# Patient Record
Sex: Female | Born: 1965 | Race: Black or African American | Hispanic: No | Marital: Married | Smoking: Current every day smoker
Health system: Southern US, Community
[De-identification: ages and names within clinical notes are randomized; demographics above are authoritative.]

## PROBLEM LIST (undated history)

## (undated) ENCOUNTER — Emergency Department (HOSPITAL_COMMUNITY): Admission: EM | Payer: MEDICAID | Source: Home / Self Care

## (undated) DIAGNOSIS — G459 Transient cerebral ischemic attack, unspecified: Secondary | ICD-10-CM

## (undated) DIAGNOSIS — T7840XA Allergy, unspecified, initial encounter: Secondary | ICD-10-CM

## (undated) DIAGNOSIS — R51 Headache: Secondary | ICD-10-CM

## (undated) DIAGNOSIS — R001 Bradycardia, unspecified: Secondary | ICD-10-CM

## (undated) DIAGNOSIS — R0682 Tachypnea, not elsewhere classified: Secondary | ICD-10-CM

## (undated) DIAGNOSIS — Z720 Tobacco use: Secondary | ICD-10-CM

## (undated) DIAGNOSIS — I639 Cerebral infarction, unspecified: Secondary | ICD-10-CM

## (undated) DIAGNOSIS — R519 Headache, unspecified: Secondary | ICD-10-CM

## (undated) DIAGNOSIS — E119 Type 2 diabetes mellitus without complications: Secondary | ICD-10-CM

## (undated) HISTORY — DX: Cerebral infarction, unspecified: I63.9

## (undated) HISTORY — DX: Tachypnea, not elsewhere classified: R06.82

## (undated) HISTORY — DX: Type 2 diabetes mellitus without complications: E11.9

## (undated) HISTORY — DX: Allergy, unspecified, initial encounter: T78.40XA

## (undated) HISTORY — PX: BRAIN SURGERY: SHX531

## (undated) HISTORY — DX: Transient cerebral ischemic attack, unspecified: G45.9

## (undated) HISTORY — DX: Bradycardia, unspecified: R00.1

---

## 1985-12-18 HISTORY — PX: TUBAL LIGATION: SHX77

## 1999-05-10 ENCOUNTER — Emergency Department (HOSPITAL_COMMUNITY): Admission: EM | Admit: 1999-05-10 | Discharge: 1999-05-10 | Payer: Self-pay | Admitting: Emergency Medicine

## 2000-03-13 ENCOUNTER — Emergency Department (HOSPITAL_COMMUNITY): Admission: EM | Admit: 2000-03-13 | Discharge: 2000-03-13 | Payer: Self-pay | Admitting: *Deleted

## 2001-07-09 ENCOUNTER — Emergency Department (HOSPITAL_COMMUNITY): Admission: EM | Admit: 2001-07-09 | Discharge: 2001-07-09 | Payer: Self-pay | Admitting: Emergency Medicine

## 2004-02-06 ENCOUNTER — Emergency Department (HOSPITAL_COMMUNITY): Admission: EM | Admit: 2004-02-06 | Discharge: 2004-02-06 | Payer: Self-pay | Admitting: Emergency Medicine

## 2004-05-13 ENCOUNTER — Emergency Department (HOSPITAL_COMMUNITY): Admission: EM | Admit: 2004-05-13 | Discharge: 2004-05-13 | Payer: Self-pay | Admitting: Emergency Medicine

## 2005-07-29 ENCOUNTER — Emergency Department (HOSPITAL_COMMUNITY): Admission: AD | Admit: 2005-07-29 | Discharge: 2005-07-29 | Payer: Self-pay | Admitting: Family Medicine

## 2007-01-31 ENCOUNTER — Ambulatory Visit (HOSPITAL_COMMUNITY): Admission: RE | Admit: 2007-01-31 | Discharge: 2007-01-31 | Payer: Self-pay | Admitting: Family Medicine

## 2007-08-15 ENCOUNTER — Emergency Department (HOSPITAL_COMMUNITY): Admission: EM | Admit: 2007-08-15 | Discharge: 2007-08-15 | Payer: Self-pay | Admitting: Family Medicine

## 2007-09-27 ENCOUNTER — Emergency Department (HOSPITAL_COMMUNITY): Admission: EM | Admit: 2007-09-27 | Discharge: 2007-09-27 | Payer: Self-pay | Admitting: Emergency Medicine

## 2008-05-27 ENCOUNTER — Emergency Department (HOSPITAL_COMMUNITY): Admission: EM | Admit: 2008-05-27 | Discharge: 2008-05-27 | Payer: Self-pay | Admitting: Family Medicine

## 2008-09-16 ENCOUNTER — Emergency Department (HOSPITAL_COMMUNITY): Admission: EM | Admit: 2008-09-16 | Discharge: 2008-09-16 | Payer: Self-pay | Admitting: Family Medicine

## 2009-04-20 ENCOUNTER — Emergency Department (HOSPITAL_COMMUNITY): Admission: EM | Admit: 2009-04-20 | Discharge: 2009-04-20 | Payer: Self-pay | Admitting: Family Medicine

## 2009-10-18 ENCOUNTER — Emergency Department (HOSPITAL_COMMUNITY): Admission: EM | Admit: 2009-10-18 | Discharge: 2009-10-18 | Payer: Self-pay | Admitting: Emergency Medicine

## 2010-03-04 ENCOUNTER — Emergency Department (HOSPITAL_COMMUNITY): Admission: EM | Admit: 2010-03-04 | Discharge: 2010-03-04 | Payer: Self-pay | Admitting: Emergency Medicine

## 2010-06-26 ENCOUNTER — Emergency Department (HOSPITAL_COMMUNITY): Admission: EM | Admit: 2010-06-26 | Discharge: 2010-06-26 | Payer: Self-pay | Admitting: Family Medicine

## 2010-07-01 ENCOUNTER — Emergency Department (HOSPITAL_COMMUNITY): Admission: EM | Admit: 2010-07-01 | Discharge: 2010-07-01 | Payer: Self-pay | Admitting: Emergency Medicine

## 2010-07-05 ENCOUNTER — Ambulatory Visit (HOSPITAL_COMMUNITY): Admission: RE | Admit: 2010-07-05 | Discharge: 2010-07-05 | Payer: Self-pay | Admitting: Internal Medicine

## 2010-07-20 ENCOUNTER — Other Ambulatory Visit: Admission: RE | Admit: 2010-07-20 | Discharge: 2010-07-20 | Payer: Self-pay | Admitting: Interventional Radiology

## 2010-07-20 ENCOUNTER — Encounter: Admission: RE | Admit: 2010-07-20 | Discharge: 2010-07-20 | Payer: Self-pay | Admitting: Internal Medicine

## 2011-03-05 LAB — DIFFERENTIAL
Basophils Absolute: 0.1 10*3/uL (ref 0.0–0.1)
Basophils Relative: 1 % (ref 0–1)
Eosinophils Relative: 1 % (ref 0–5)
Lymphocytes Relative: 45 % (ref 12–46)
Lymphs Abs: 3.6 10*3/uL (ref 0.7–4.0)
Monocytes Relative: 4 % (ref 3–12)

## 2011-03-05 LAB — CBC
Hemoglobin: 15.4 g/dL — ABNORMAL HIGH (ref 12.0–15.0)
MCH: 33 pg (ref 26.0–34.0)
MCV: 95.8 fL (ref 78.0–100.0)
RBC: 4.66 MIL/uL (ref 3.87–5.11)

## 2011-09-14 LAB — POCT RAPID STREP A: Streptococcus, Group A Screen (Direct): NEGATIVE

## 2012-02-17 ENCOUNTER — Emergency Department (HOSPITAL_COMMUNITY)
Admission: EM | Admit: 2012-02-17 | Discharge: 2012-02-17 | Disposition: A | Discharge status: Home Health | Payer: No Typology Code available for payment source | Attending: Emergency Medicine | Admitting: Emergency Medicine

## 2012-02-17 ENCOUNTER — Emergency Department (HOSPITAL_COMMUNITY): Payer: No Typology Code available for payment source

## 2012-02-17 ENCOUNTER — Encounter (HOSPITAL_COMMUNITY): Payer: Self-pay | Admitting: *Deleted

## 2012-02-17 DIAGNOSIS — M545 Low back pain, unspecified: Secondary | ICD-10-CM | POA: Insufficient documentation

## 2012-02-17 DIAGNOSIS — S139XXA Sprain of joints and ligaments of unspecified parts of neck, initial encounter: Secondary | ICD-10-CM | POA: Insufficient documentation

## 2012-02-17 DIAGNOSIS — M542 Cervicalgia: Secondary | ICD-10-CM | POA: Insufficient documentation

## 2012-02-17 DIAGNOSIS — S161XXA Strain of muscle, fascia and tendon at neck level, initial encounter: Secondary | ICD-10-CM

## 2012-02-17 MED ORDER — HYDROCODONE-ACETAMINOPHEN 5-325 MG PO TABS: 2.0000 | ORAL_TABLET | ORAL | Status: DC | PRN

## 2012-02-17 MED ORDER — IBUPROFEN 800 MG PO TABS: 800.0000 mg | ORAL_TABLET | Freq: Three times a day (TID) | ORAL | Status: DC

## 2012-02-17 MED ORDER — HYDROCODONE-ACETAMINOPHEN 5-325 MG PO TABS
1.0000 | ORAL_TABLET | Freq: Once | ORAL | Status: AC
  Administered 2012-02-17: 1 via ORAL
  Filled 2012-02-17: qty 1

## 2012-02-17 NOTE — ED Notes (Signed)
Patient transported to X-ray 
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
## 2012-02-21 ENCOUNTER — Emergency Department (HOSPITAL_COMMUNITY)
Admission: EM | Admit: 2012-02-21 | Discharge: 2012-02-21 | Disposition: A | Discharge status: Home / Self Care | Payer: No Typology Code available for payment source | Attending: Emergency Medicine | Admitting: Emergency Medicine

## 2012-02-21 ENCOUNTER — Encounter (HOSPITAL_COMMUNITY): Payer: Self-pay | Admitting: *Deleted

## 2012-02-21 DIAGNOSIS — S139XXA Sprain of joints and ligaments of unspecified parts of neck, initial encounter: Secondary | ICD-10-CM | POA: Insufficient documentation

## 2012-02-21 DIAGNOSIS — G44209 Tension-type headache, unspecified, not intractable: Secondary | ICD-10-CM | POA: Insufficient documentation

## 2012-02-21 DIAGNOSIS — J45909 Unspecified asthma, uncomplicated: Secondary | ICD-10-CM | POA: Insufficient documentation

## 2012-02-21 DIAGNOSIS — Y92410 Unspecified street and highway as the place of occurrence of the external cause: Secondary | ICD-10-CM | POA: Insufficient documentation

## 2012-02-21 DIAGNOSIS — F17200 Nicotine dependence, unspecified, uncomplicated: Secondary | ICD-10-CM | POA: Insufficient documentation

## 2012-02-21 DIAGNOSIS — S161XXA Strain of muscle, fascia and tendon at neck level, initial encounter: Secondary | ICD-10-CM

## 2012-02-21 MED ORDER — DIAZEPAM 5 MG PO TABS: 5.0000 mg | ORAL_TABLET | Freq: Two times a day (BID) | ORAL | Status: AC

## 2012-02-21 MED ORDER — NAPROXEN 500 MG PO TABS: 500.0000 mg | ORAL_TABLET | Freq: Two times a day (BID) | ORAL | Status: DC

## 2012-02-21 NOTE — Discharge Instructions (Signed)
I suspect your headache is related to muscle spasm in your neck. Please take the naproxen as prescribed for the next 3-5 days, twice daily. Use the muscle relaxer as needed. Use ice over the area, wrapped in a towel, for 20 minutes at a time several times daily. Return to the ED if you have worsening pain or condition or any other concerns.  Cervical Strain Care After A cervical strain is when the muscles and ligaments in your neck have been stretched. The bones are not broken. If you had any problems moving your arms or legs immediately after the injury, even if the problem has gone away, make sure to tell this to your caregiver.  HOME CARE INSTRUCTIONS   While awake, apply ice packs to the neck or areas of pain about every 1 to 2 hours, for 15 to 20 minutes at a time. Do this for 2 days. If you were given a cervical collar for support, ask your caregiver if you may remove it for bathing or applying ice.   If given a cervical collar, wear as instructed. Do not remove any collar unless instructed by a caregiver.   Only take over-the-counter or prescription medicines for pain, discomfort, or fever as directed by your caregiver.  Recheck with the hospital or clinic after a radiologist has read your X-rays. Recheck with the hospital or clinic to make sure the initial readings are correct. Do this also to determine if you need further studies. It is your responsibility to find out your X-ray results. X-rays are sometimes repeated in one week to ten days. These are often repeated to make sure that a hairline fracture was not overlooked. Ask your caregiver how you are to find out about your radiology (X-ray) results. SEEK IMMEDIATE MEDICAL CARE IF:   You have increasing pain in your neck.   You develop difficulties swallowing or breathing.   You have numbness, weakness, or movement problems in the arms or legs.   You have difficulty walking.   You develop bowel or bladder retention or incontinence.     You have problems with walking.  MAKE SURE YOU:   Understand these instructions.   Will watch your condition.   Will get help right away if you are not doing well or get worse.  Document Released: 12/04/2005 Document Revised: 08/16/2011 Document Reviewed: 07/17/2008 [facility] Patient Information 2012 [place], [place].Tension Headache (Muscle Contraction Headache) Tension headache is one of the most common causes of head pain. These headaches are usually felt as a pain over the top of your head and back of your neck. Stress, anxiety, and depression are common triggers for these headaches. Tension headaches are not life-threatening and will not lead to other types of headaches. Tension headaches can often be diagnosed by taking a history from the patient and a physical exam. Sometimes, further lab and x-ray studies are used to confirm the diagnosis. Your caregiver can advise you on how to get help solving problems that cause anxiety or stress. Antidepressants can be prescribed if depression is a problem. HOME CARE INSTRUCTIONS   If testing was done, call for your results. Remember, it is your responsibility to get the results of all testing. Do not assume everything is fine because you do not hear from your caregiver.   Only take over-the-counter or prescription medicines for pain, discomfort, or fever as directed by your caregiver.   Biofeedback, massage, or other relaxation techniques may be helpful.   Ice packs or heat to the head  and neck can be used. Use these three to four times per day or as needed.   Physical therapy may be a useful addition to treatment.   If headaches continue, even with therapy, you may need to think about lifestyle changes.   Avoid excessive use of pain killers, as rebound headaches can occur.  SEEK MEDICAL CARE IF:   You develop problems with medications prescribed.   You do not respond or get no relief from medications.   You have a change from the usual  headache.   You develop nausea (feeling sick to your stomach) or vomiting.  SEEK IMMEDIATE MEDICAL CARE IF:   Your headache becomes severe.   You have an unexplained oral temperature above 102 F (38.9 C).   You develop a stiff neck.   You have loss of vision.   You have muscular weakness.   You have loss of muscular control.   You develop severe symptoms different from your first symptoms.   You start losing your balance or have trouble walking.   You feel faint or pass out.  MAKE SURE YOU:   Understand these instructions.   Will watch your condition.   Will get help right away if you are not doing well or get worse.  Document Released: 12/04/2005 Document Revised: 11/23/2011 Document Reviewed: 07/23/2008 [facility] Patient Information 2012 [place], [place].
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
## 2012-02-24 NOTE — ED Provider Notes (Signed)
Medical screening examination/treatment/procedure(s) were performed by non-physician practitioner and as supervising physician I was immediately available for consultation/collaboration.  Cyndra Numbers, MD 02/24/12 [phone]

## 2012-04-04 ENCOUNTER — Emergency Department (HOSPITAL_COMMUNITY)
Admission: EM | Admit: 2012-04-04 | Discharge: 2012-04-04 | Disposition: A | Discharge status: Home / Self Care | Payer: No Typology Code available for payment source | Attending: Emergency Medicine | Admitting: Emergency Medicine

## 2012-04-04 ENCOUNTER — Encounter (HOSPITAL_COMMUNITY): Payer: Self-pay | Admitting: General Practice

## 2012-04-04 DIAGNOSIS — M542 Cervicalgia: Secondary | ICD-10-CM | POA: Insufficient documentation

## 2012-04-04 DIAGNOSIS — F17200 Nicotine dependence, unspecified, uncomplicated: Secondary | ICD-10-CM | POA: Insufficient documentation

## 2012-04-04 DIAGNOSIS — T148XXA Other injury of unspecified body region, initial encounter: Secondary | ICD-10-CM | POA: Insufficient documentation

## 2012-04-04 DIAGNOSIS — M545 Low back pain, unspecified: Secondary | ICD-10-CM | POA: Insufficient documentation

## 2012-04-04 DIAGNOSIS — J45909 Unspecified asthma, uncomplicated: Secondary | ICD-10-CM | POA: Insufficient documentation

## 2012-04-04 DIAGNOSIS — M79609 Pain in unspecified limb: Secondary | ICD-10-CM | POA: Insufficient documentation

## 2012-04-04 MED ORDER — IBUPROFEN 800 MG PO TABS: 800.0000 mg | ORAL_TABLET | Freq: Three times a day (TID) | ORAL | Status: AC

## 2012-04-04 MED ORDER — CYCLOBENZAPRINE HCL 10 MG PO TABS
10.0000 mg | ORAL_TABLET | Freq: Once | ORAL | Status: AC
  Administered 2012-04-04: 10 mg via ORAL
  Filled 2012-04-04: qty 1

## 2012-04-04 MED ORDER — IBUPROFEN 800 MG PO TABS
800.0000 mg | ORAL_TABLET | Freq: Once | ORAL | Status: AC
  Administered 2012-04-04: 800 mg via ORAL
  Filled 2012-04-04: qty 1

## 2012-04-04 MED ORDER — CYCLOBENZAPRINE HCL 10 MG PO TABS: 10.0000 mg | ORAL_TABLET | Freq: Three times a day (TID) | ORAL | Status: AC | PRN

## 2012-04-04 NOTE — ED Provider Notes (Signed)
Medical screening examination/treatment/procedure(s) were performed by non-physician practitioner and as supervising physician I was immediately available for consultation/collaboration.  Cheri Guppy, MD 04/04/12 2005
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
## 2012-10-09 ENCOUNTER — Encounter (HOSPITAL_COMMUNITY): Payer: Self-pay | Admitting: *Deleted

## 2012-10-09 ENCOUNTER — Emergency Department (HOSPITAL_COMMUNITY)
Admission: EM | Admit: 2012-10-09 | Discharge: 2012-10-09 | Disposition: A | Discharge status: Home / Self Care | Payer: Self-pay | Attending: Emergency Medicine | Admitting: Emergency Medicine

## 2012-10-09 DIAGNOSIS — K047 Periapical abscess without sinus: Secondary | ICD-10-CM

## 2012-10-09 DIAGNOSIS — F17200 Nicotine dependence, unspecified, uncomplicated: Secondary | ICD-10-CM | POA: Insufficient documentation

## 2012-10-09 DIAGNOSIS — K044 Acute apical periodontitis of pulpal origin: Secondary | ICD-10-CM | POA: Insufficient documentation

## 2012-10-09 DIAGNOSIS — J45909 Unspecified asthma, uncomplicated: Secondary | ICD-10-CM | POA: Insufficient documentation

## 2012-10-09 MED ORDER — DIAZEPAM 5 MG PO TABS
5.0000 mg | ORAL_TABLET | Freq: Once | ORAL | Status: AC
  Administered 2012-10-09: 5 mg via ORAL
  Filled 2012-10-09: qty 1

## 2012-10-09 MED ORDER — DIAZEPAM 5 MG PO TABS: 5.0000 mg | ORAL_TABLET | Freq: Two times a day (BID) | ORAL | Status: DC

## 2012-10-09 MED ORDER — KETOROLAC TROMETHAMINE 60 MG/2ML IM SOLN
60.0000 mg | Freq: Once | INTRAMUSCULAR | Status: AC
  Administered 2012-10-09: 60 mg via INTRAMUSCULAR
  Filled 2012-10-09: qty 2

## 2012-10-09 MED ORDER — PENICILLIN V POTASSIUM 500 MG PO TABS: 500.0000 mg | ORAL_TABLET | Freq: Three times a day (TID) | ORAL | Status: DC

## 2012-10-09 NOTE — ED Provider Notes (Signed)
History     CSN: [id number]  Arrival date & time 10/09/12  1956   First MD Initiated Contact with Patient 10/09/12 2103      Chief Complaint  Patient presents with  . Dental Pain    (Consider location/radiation/quality/duration/timing/severity/associated sxs/prior treatment) HPI Comments: Patient is a 46 year old female who presents with dental pain for the past 3 days. The pain started gradually and progressively worsened. The pain is throbbing and located at her left lower jaw. She reports associated swelling that started this morning. Eating makes the pain worse. Nothing makes the pain better. She has not tried anything for symptoms relief. Patient denies fever, visual changes, headache, NVD, throat swelling, chest pain, SOB, abdominal pain.   Patient is a 46 y.o. female presenting with tooth pain.  Dental Pain Additional symptoms include: facial swelling.    Past Medical History  Diagnosis Date  . Asthma     Past Surgical History  Procedure Date  . Tubal ligation     History reviewed. No pertinent family history.  History  Substance Use Topics  . Smoking status: Current Every Day Smoker    Types: Cigarettes  . Smokeless tobacco: Not on file  . Alcohol Use: Yes     occ    OB History    Grav Para Term Preterm Abortions TAB SAB Ect Mult Living                  Review of Systems  HENT: Positive for facial swelling and dental problem.   All other systems reviewed and are negative.    Allergies  Hydrocodone  Home Medications   Current Outpatient Rx  Name Route Sig Dispense Refill  . IBUPROFEN 200 MG PO TABS Oral Take 400 mg by mouth every 8 (eight) hours as needed. For pain      BP 118/61  Temp 98.4 F (36.9 C) (Oral)  Resp 18  Ht 5\' 3"  (1.6 m)  Wt 250 lb (113.399 kg)  BMI 44.29 kg/m2  SpO2 100%  LMP 09/18/2012  Physical Exam  Nursing note and vitals reviewed. Constitutional: She is oriented to person, place, and time. She appears  well-developed and well-nourished. No distress.  HENT:  Head: Normocephalic and atraumatic.  Mouth/Throat: Oropharynx is clear and moist. No oropharyngeal exudate.       Notable left lower jaw swelling and tenderness to palpation. Poor dentition. Left lower molar cracked and extremely tender to percussion. No buccal mucosa ulcerations noted.   Eyes: Conjunctivae normal and EOM are normal. Pupils are equal, round, and reactive to light. No scleral icterus.  Neck: Normal range of motion. Neck supple.  Cardiovascular: Normal rate and regular rhythm.  Exam reveals no gallop and no friction rub.   No murmur heard. Pulmonary/Chest: Effort normal and breath sounds normal. She has no wheezes. She has no rales. She exhibits no tenderness.  Abdominal: Soft. She exhibits no distension.  Musculoskeletal: Normal range of motion.  Neurological: She is alert and oriented to person, place, and time.       Speech is goal-oriented. Moves limbs without ataxia.   Skin: Skin is warm and dry. She is not diaphoretic.  Psychiatric: She has a normal mood and affect. Her behavior is normal.    ED Course  Procedures (including critical care time)  Labs Reviewed - No data to display No results found.   1. Dental infection       MDM  9:48 PM Patient will have toradol and valium  for pain. Swelling likely due to lymphadenopathy rather than parotid stone or infection. Patient will go home with Pen VK, Vicodin, and dental follow up. Patient currently afebrile without signs of airway compromise. No further evaluation needed at this time.         Emilia Beck, PA-C 10/09/12 2253

## 2012-10-09 NOTE — ED Provider Notes (Signed)
Medical screening examination/treatment/procedure(s) were performed by non-physician practitioner and as supervising physician I was immediately available for consultation/collaboration.   Kherington Meraz H Bernardina Cacho, MD 10/09/12 2330 

## 2012-10-09 NOTE — ED Notes (Signed)
Pt c/o toothache pain. Pt has not seen dentist in many years. Pt with pain on left lower back side of mouth.

## 2014-02-02 ENCOUNTER — Emergency Department (HOSPITAL_COMMUNITY): Payer: Self-pay

## 2014-02-02 ENCOUNTER — Emergency Department (HOSPITAL_COMMUNITY)
Admission: EM | Admit: 2014-02-02 | Discharge: 2014-02-02 | Disposition: A | Discharge status: Home / Self Care | Payer: Self-pay | Attending: Emergency Medicine | Admitting: Emergency Medicine

## 2014-02-02 ENCOUNTER — Encounter (HOSPITAL_COMMUNITY): Payer: Self-pay | Admitting: Emergency Medicine

## 2014-02-02 DIAGNOSIS — R220 Localized swelling, mass and lump, head: Secondary | ICD-10-CM | POA: Insufficient documentation

## 2014-02-02 DIAGNOSIS — J45909 Unspecified asthma, uncomplicated: Secondary | ICD-10-CM | POA: Insufficient documentation

## 2014-02-02 DIAGNOSIS — R61 Generalized hyperhidrosis: Secondary | ICD-10-CM | POA: Insufficient documentation

## 2014-02-02 DIAGNOSIS — R221 Localized swelling, mass and lump, neck: Secondary | ICD-10-CM

## 2014-02-02 DIAGNOSIS — J069 Acute upper respiratory infection, unspecified: Secondary | ICD-10-CM | POA: Insufficient documentation

## 2014-02-02 DIAGNOSIS — J111 Influenza due to unidentified influenza virus with other respiratory manifestations: Secondary | ICD-10-CM | POA: Insufficient documentation

## 2014-02-02 DIAGNOSIS — F17200 Nicotine dependence, unspecified, uncomplicated: Secondary | ICD-10-CM | POA: Insufficient documentation

## 2014-02-02 MED ORDER — BENZONATATE 100 MG PO CAPS: 100.0000 mg | ORAL_CAPSULE | Freq: Three times a day (TID) | ORAL | Status: DC

## 2014-02-02 MED ORDER — PROMETHAZINE-DM 6.25-15 MG/5ML PO SYRP: 5.0000 mL | ORAL_SOLUTION | Freq: Four times a day (QID) | ORAL | Status: DC | PRN

## 2014-02-02 MED ORDER — GUAIFENESIN ER 1200 MG PO TB12: 1.0000 | ORAL_TABLET | Freq: Two times a day (BID) | ORAL | Status: DC

## 2014-02-02 NOTE — Progress Notes (Signed)
[facility] provided pt with a list of primary care resources and [facility] orange card application to help patient establish primary care.  °

## 2014-02-02 NOTE — ED Provider Notes (Signed)
CSN: [id number]     Arrival date & time 02/02/14  4098 History   First MD Initiated Contact with Patient 02/02/14 0750     Chief Complaint  Patient presents with  . flu like symptoms     x2 weeks intermittently     (Consider location/radiation/quality/duration/timing/severity/associated sxs/prior Treatment) HPI 48 yo black female with PMH of asthma who presents with flu like symptoms for 4 days. Patient complains of rhinorrhea, headache, diarrhea, sore throat, cough, increased sputum production, and vomiting yesterday. She has been feeling poorly off and on for approximately 2 weeks. This episode started 4 days ago. She lives with her grandchildren who have been sick as well. Patient denies hematemesis, hemoptysis, dysphagia, chest pain, and SOB.   Past Medical History  Diagnosis Date  . Asthma    Past Surgical History  Procedure Laterality Date  . Tubal ligation     No family history on file. History  Substance Use Topics  . Smoking status: Current Every Day Smoker -- 1.00 packs/day    Types: Cigarettes  . Smokeless tobacco: Not on file  . Alcohol Use: Yes     Comment: occ   OB History   Grav Para Term Preterm Abortions TAB SAB Ect Mult Living                 Review of Systems  Constitutional: Positive for chills, diaphoresis and fatigue.  HENT: Positive for congestion, facial swelling, postnasal drip, rhinorrhea, sinus pressure and sore throat. Negative for ear pain and trouble swallowing.   Eyes: Negative for visual disturbance.  Respiratory: Positive for cough. Negative for shortness of breath.   Cardiovascular: Negative for chest pain.  Gastrointestinal: Positive for nausea, vomiting and diarrhea. Negative for abdominal pain.      Allergies  Hydrocodone  Home Medications   Current Outpatient Rx  Name  Route  Sig  Dispense  Refill  . Diphenhydramine-PE-APAP 25-5-325 MG TABS   Oral   Take 1 tablet by mouth every 6 (six) hours as needed (cold /flu).          BP 123/74  Pulse 102  Temp(Src) 98.4 F (36.9 C) (Oral)  Resp 20  SpO2 96%  LMP 12/04/2013 Physical Exam  Nursing note and vitals reviewed. Constitutional: She is oriented to person, place, and time. She appears well-developed and well-nourished. No distress.  HENT:  Head: Normocephalic and atraumatic.  Mouth/Throat: Uvula is midline. No uvula swelling. Posterior oropharyngeal erythema present. No oropharyngeal exudate or tonsillar abscesses.  Eyes: Pupils are equal, round, and reactive to light.  Neck: Normal range of motion. Neck supple.  Cardiovascular: Normal rate, regular rhythm and normal heart sounds.  Exam reveals no gallop and no friction rub.   No murmur heard. Pulmonary/Chest: Effort normal and breath sounds normal. No respiratory distress. She has no wheezes. She has no rales.  Abdominal: Soft. Bowel sounds are normal. She exhibits no distension. There is no tenderness. There is no rebound.  Lymphadenopathy:    She has no cervical adenopathy.  Neurological: She is alert and oriented to person, place, and time. She exhibits normal muscle tone. Coordination normal.  Skin: Skin is warm and dry. No rash noted. No erythema.    ED Course  Procedures (including critical care time) Labs Review Labs Reviewed - No data to display Imaging Review Dg Chest 2 View  02/02/2014   CLINICAL DATA:  Two week history of cough and sneezing.  EXAM: CHEST  2 VIEW  COMPARISON:  09/27/2007.  FINDINGS:  Cardiomediastinal silhouette unremarkable and unchanged. Minimal linear atelectasis or scarring in the lingula. Lungs otherwise clear. No localized airspace consolidation. No pleural effusions. No pneumothorax. Normal pulmonary vascularity. Visualized bony thorax intact.  IMPRESSION: Minimal linear atelectasis or scarring in the lingula. No acute cardiopulmonary disease otherwise.   Electronically Signed   By: Hulan Saashomas  Lawrence M.D.   On: 02/02/2014 08:48   Patient be treated for influenza-like  illness.  Told to return here as needed.  Patient, agrees with plan and all questions were answered  Carlyle DollyChristopher W Jumana Paccione, PA-C 02/02/14 1223

## 2014-02-02 NOTE — Discharge Instructions (Signed)
Return here as needed.  Increase your fluid intake, and rest as much possible

## 2014-02-02 NOTE — ED Notes (Signed)
Patient states she has had flu like symptoms for two weeks. States she feels like she gets over the symptoms but then has a relapse. Symptoms include runny nose, sore throat, warm to touch, emesis, diarrhea.

## 2014-02-04 NOTE — ED Provider Notes (Signed)
Medical screening examination/treatment/procedure(s) were conducted as a shared visit with non-physician practitioner(s) and myself.  I personally evaluated the patient during the encounter.  EKG Interpretation   None       Pt c/o congestion, non prod cough, achy for the past 3-4 days. Chest cta. Tolerating po fluids.   Suzi RootsKevin E Leonid Manus, MD 02/04/14 [phone]

## 2014-12-14 ENCOUNTER — Encounter (HOSPITAL_COMMUNITY): Payer: Self-pay | Admitting: Emergency Medicine

## 2014-12-14 ENCOUNTER — Emergency Department (HOSPITAL_COMMUNITY): Payer: Self-pay

## 2014-12-14 ENCOUNTER — Emergency Department (HOSPITAL_COMMUNITY)
Admission: EM | Admit: 2014-12-14 | Discharge: 2014-12-14 | Disposition: A | Discharge status: Home / Self Care | Payer: Self-pay | Attending: Emergency Medicine | Admitting: Emergency Medicine

## 2014-12-14 DIAGNOSIS — M25561 Pain in right knee: Secondary | ICD-10-CM | POA: Insufficient documentation

## 2014-12-14 DIAGNOSIS — J45909 Unspecified asthma, uncomplicated: Secondary | ICD-10-CM | POA: Insufficient documentation

## 2014-12-14 DIAGNOSIS — Z79899 Other long term (current) drug therapy: Secondary | ICD-10-CM | POA: Insufficient documentation

## 2014-12-14 DIAGNOSIS — M79606 Pain in leg, unspecified: Secondary | ICD-10-CM

## 2014-12-14 DIAGNOSIS — Z720 Tobacco use: Secondary | ICD-10-CM | POA: Insufficient documentation

## 2014-12-14 MED ORDER — OXYCODONE-ACETAMINOPHEN 5-325 MG PO TABS
1.0000 | ORAL_TABLET | Freq: Once | ORAL | Status: AC
  Administered 2014-12-14: 1 via ORAL
  Filled 2014-12-14: qty 1

## 2014-12-14 MED ORDER — TRAMADOL HCL 50 MG PO TABS: 50.0000 mg | ORAL_TABLET | Freq: Four times a day (QID) | ORAL | Status: DC | PRN

## 2014-12-14 MED ORDER — NAPROXEN 500 MG PO TABS: 500.0000 mg | ORAL_TABLET | Freq: Two times a day (BID) | ORAL | Status: DC

## 2014-12-14 NOTE — ED Notes (Addendum)
PA at bedside.

## 2014-12-14 NOTE — ED Notes (Signed)
Pt from home for eval of left leg pain that started x1 week. Pt denies any injury to leg, denies any redness or swelling. NAD noted, full ROM noted.

## 2014-12-14 NOTE — Progress Notes (Signed)
VASCULAR LAB PRELIMINARY  PRELIMINARY  PRELIMINARY  PRELIMINARY  Right lower extremity venous duplex completed.    Preliminary report:  Right:  No evidence of DVT, superficial thrombosis, or Baker's cyst.  Keary Waterson, RVT 12/14/2014, 5:32 PM

## 2014-12-14 NOTE — ED Provider Notes (Signed)
CSN: [id number]     Arrival date & time 12/14/14  1541 History   First MD Initiated Contact with Patient 12/14/14 1847     Chief Complaint  Patient presents with  . Leg Pain     (Consider location/radiation/quality/duration/timing/severity/associated sxs/prior Treatment) HPI Esaw GrandchildLeah Blaney is a 48 y.o. female with history of asthma, presents to emergency department complaining of right knee and right calf pain. Patient states pain started about a week ago. She denies any injuries. States pain is mainly behind her right knee radiating into the calf. Pain is worsened with movement of the knee and walking. She reports similar episode about 6 years ago, states was seen by an orthopedist, was told she may have arthritis, and she was treated with some medications and she has not had any trouble with it since. Patient denies any fever or chills. Denies any numbness or weakness in extremities. No recent travel or surgeries. No history of blood clots. No pain to the distal leg.  Past Medical History  Diagnosis Date  . Asthma    Past Surgical History  Procedure Laterality Date  . Tubal ligation     History reviewed. No pertinent family history. History  Substance Use Topics  . Smoking status: Current Every Day Smoker -- 1.00 packs/day    Types: Cigarettes  . Smokeless tobacco: Not on file  . Alcohol Use: Yes     Comment: occ   OB History    No data available     Review of Systems  Constitutional: Negative for fever and chills.  Respiratory: Negative for cough, chest tightness and shortness of breath.   Cardiovascular: Negative for chest pain, palpitations and leg swelling.  Gastrointestinal: Negative for nausea, vomiting, abdominal pain and diarrhea.  Musculoskeletal: Positive for joint swelling and arthralgias. Negative for myalgias, neck pain and neck stiffness.  Skin: Negative for rash.  Neurological: Negative for dizziness, weakness and headaches.  All other systems reviewed and  are negative.     Allergies  Hydrocodone  Home Medications   Prior to Admission medications   Medication Sig Start Date End Date Taking? Authorizing Provider  benzonatate (TESSALON) 100 MG capsule Take 1 capsule (100 mg total) by mouth every 8 (eight) hours. 02/02/14   Jamesetta Orleanshristopher W Lawyer, PA-C  Diphenhydramine-PE-APAP 25-5-325 MG TABS Take 1 tablet by mouth every 6 (six) hours as needed (cold /flu).    Historical Provider, MD  Guaifenesin 1200 MG TB12 Take 1 tablet (1,200 mg total) by mouth 2 (two) times daily. 02/02/14   Jamesetta Orleanshristopher W Lawyer, PA-C  naproxen (NAPROSYN) 500 MG tablet Take 1 tablet (500 mg total) by mouth 2 (two) times daily. 12/14/14   Adeleigh Barletta A Harlin Mazzoni, PA-C  promethazine-dextromethorphan (PROMETHAZINE-DM) 6.25-15 MG/5ML syrup Take 5 mLs by mouth 4 (four) times daily as needed for cough. 02/02/14   Jamesetta Orleanshristopher W Lawyer, PA-C  traMADol (ULTRAM) 50 MG tablet Take 1 tablet (50 mg total) by mouth every 6 (six) hours as needed. 12/14/14   Milliana Reddoch A Mattisyn Cardona, PA-C   BP 118/65 mmHg  Pulse 83  Temp(Src) 98.2 F (36.8 C) (Oral)  Resp 18  SpO2 99% Physical Exam  Constitutional: She appears well-developed and well-nourished. No distress.  HENT:  Head: Normocephalic.  Eyes: Conjunctivae are normal.  Neck: Neck supple.  Cardiovascular: Normal rate, regular rhythm and normal heart sounds.   Pulmonary/Chest: Effort normal and breath sounds normal. No respiratory distress. She has no wheezes. She has no rales.  Musculoskeletal: She exhibits no edema.  Normal-appearing right  knee. No tenderness over anterior knee, no tenderness of the medial lateral joint. Tenderness in the posterior knee, and tenderness over right calf. Full range of motion of the knee joint, pain with full flexion. Negative anterior-posterior drawer signs. No swelling, redness, warmth to touch over the knee joint. Dorsal pedal pulses are intact and equal bilaterally. Positive Homans sign.  Neurological: She  is alert.  Skin: Skin is warm and dry.  Psychiatric: She has a normal mood and affect. Her behavior is normal.  Nursing note and vitals reviewed.   ED Course  Procedures (including critical care time) Labs Review Labs Reviewed - No data to display  Imaging Review Dg Knee Complete 4 Views Right  12/14/2014   CLINICAL DATA:  Right posterior knee pain x1 week, no known injury  EXAM: RIGHT KNEE - COMPLETE 4+ VIEW  COMPARISON:  None.  FINDINGS: No fracture or dislocation is seen.  The joint spaces are preserved.  The visualized soft tissues are unremarkable.  Possible small suprapatellar knee joint effusion.  IMPRESSION: No fracture or dislocation is seen.  Possible small suprapatellar knee joint effusion.   Electronically Signed   By: Charline BillsSriyesh  Krishnan M.D.   On: 12/14/2014 20:00     EKG Interpretation None      MDM   Final diagnoses:  Knee pain, acute, right    Patient with nontraumatic right knee pain. 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 Venous Doppler obtained and is negative for clot or Baker cyst. X-ray of the knee obtained, unremarkable. Most likely arthritic changes versus injury to the calf muscle. Ace wrap applied. Instructed to keep it elevated at home, ice. Will discharge home with naproxen, Ultram. Follow up with orthopedics. Pt is nontoxic appearing. No signs of infection. Neurovascularly intact.  Filed Vitals:   12/14/14 1608 12/14/14 1900 12/14/14 1915  BP: 128/70 118/65 118/65  Pulse: 83 83 83  Temp: 98.2 F (36.8 C)    TempSrc: Oral    Resp: 18  18  SpO2: 99% 99% 99%       Lottie Musselatyana A [employer], PA-C 12/14/14 2033  Gilda Creasehristopher J. Pollina, MD 12/14/14 2033

## 2014-12-14 NOTE — Discharge Instructions (Signed)
Naprosyn for pain and inflammation. Ultram for severe pain. Keep ACE wrap on. Elevate. Follow up with orthopedics doctor as referred.   Knee Pain The knee is the complex joint between your thigh and your lower leg. It is made up of bones, tendons, ligaments, and cartilage. The bones that make up the knee are:  The femur in the thigh.  The tibia and fibula in the lower leg.  The patella or kneecap riding in the groove on the lower femur. CAUSES  Knee pain is a common complaint with many causes. A few of these causes are:  Injury, such as:  A ruptured ligament or tendon injury.  Torn cartilage.  Medical conditions, such as:  Gout  Arthritis  Infections  Overuse, over training, or overdoing a physical activity. Knee pain can be minor or severe. Knee pain can accompany debilitating injury. Minor knee problems often respond well to self-care measures or get well on their own. More serious injuries may need medical intervention or even surgery. SYMPTOMS The knee is complex. Symptoms of knee problems can vary widely. Some of the problems are:  Pain with movement and weight bearing.  Swelling and tenderness.  Buckling of the knee.  Inability to straighten or extend your knee.  Your knee locks and you cannot straighten it.  Warmth and redness with pain and fever.  Deformity or dislocation of the kneecap. DIAGNOSIS  Determining what is wrong may be very straight forward such as when there is an injury. It can also be challenging because of the complexity of the knee. Tests to make a diagnosis may include:  Your caregiver taking a history and doing a physical exam.  Routine X-rays can be used to rule out other problems. X-rays will not reveal a cartilage tear. Some injuries of the knee can be diagnosed by:  Arthroscopy a surgical technique by which a small video camera is inserted through tiny incisions on the sides of the knee. This procedure is used to examine and repair  internal knee joint problems. Tiny instruments can be used during arthroscopy to repair the torn knee cartilage (meniscus).  Arthrography is a radiology technique. A contrast liquid is directly injected into the knee joint. Internal structures of the knee joint then become visible on X-ray film.  An MRI scan is a non X-ray radiology procedure in which magnetic fields and a computer produce two- or three-dimensional images of the inside of the knee. Cartilage tears are often visible using an MRI scanner. MRI scans have largely replaced arthrography in diagnosing cartilage tears of the knee.  Blood work.  Examination of the fluid that helps to lubricate the knee joint (synovial fluid). This is done by taking a sample out using a needle and a syringe. TREATMENT The treatment of knee problems depends on the cause. Some of these treatments are:  Depending on the injury, proper casting, splinting, surgery, or physical therapy care will be needed.  Give yourself adequate recovery time. Do not overuse your joints. If you begin to get sore during workout routines, back off. Slow down or do fewer repetitions.  For repetitive activities such as cycling or running, maintain your strength and nutrition.  Alternate muscle groups. For example, if you are a weight lifter, work the upper body on one day and the lower body the next.  Either tight or weak muscles do not give the proper support for your knee. Tight or weak muscles do not absorb the stress placed on the knee joint. Keep the  muscles surrounding the knee strong.  Take care of mechanical problems.  If you have flat feet, orthotics or special shoes may help. See your caregiver if you need help.  Arch supports, sometimes with wedges on the inner or outer aspect of the heel, can help. These can shift pressure away from the side of the knee most bothered by osteoarthritis.  A brace called an "unloader" brace also may be used to help ease the  pressure on the most arthritic side of the knee.  If your caregiver has prescribed crutches, braces, wraps or ice, use as directed. The acronym for this is PRICE. This means protection, rest, ice, compression, and elevation.  Nonsteroidal anti-inflammatory drugs (NSAIDs), can help relieve pain. But if taken immediately after an injury, they may actually increase swelling. Take NSAIDs with food in your stomach. Stop them if you develop stomach problems. Do not take these if you have a history of ulcers, stomach pain, or bleeding from the bowel. Do not take without your caregiver's approval if you have problems with fluid retention, heart failure, or kidney problems.  For ongoing knee problems, physical therapy may be helpful.  Glucosamine and chondroitin are over-the-counter dietary supplements. Both may help relieve the pain of osteoarthritis in the knee. These medicines are different from the usual anti-inflammatory drugs. Glucosamine may decrease the rate of cartilage destruction.  Injections of a corticosteroid drug into your knee joint may help reduce the symptoms of an arthritis flare-up. They may provide pain relief that lasts a few months. You may have to wait a few months between injections. The injections do have a small increased risk of infection, water retention, and elevated blood sugar levels.  Hyaluronic acid injected into damaged joints may ease pain and provide lubrication. These injections may work by reducing inflammation. A series of shots may give relief for as long as 6 months.  Topical painkillers. Applying certain ointments to your skin may help relieve the pain and stiffness of osteoarthritis. Ask your pharmacist for suggestions. Many over the-counter products are approved for temporary relief of arthritis pain.  In some countries, doctors often prescribe topical NSAIDs for relief of chronic conditions such as arthritis and tendinitis. A review of treatment with NSAID creams  found that they worked as well as oral medications but without the serious side effects. PREVENTION  Maintain a healthy weight. Extra pounds put more strain on your joints.  Get strong, stay limber. Weak muscles are a common cause of knee injuries. Stretching is important. Include flexibility exercises in your workouts.  Be smart about exercise. If you have osteoarthritis, chronic knee pain or recurring injuries, you may need to change the way you exercise. This does not mean you have to stop being active. If your knees ache after jogging or playing basketball, consider switching to swimming, water aerobics, or other low-impact activities, at least for a few days a week. Sometimes limiting high-impact activities will provide relief.  Make sure your shoes fit well. Choose footwear that is right for your sport.  Protect your knees. Use the proper gear for knee-sensitive activities. Use kneepads when playing volleyball or laying carpet. Buckle your seat belt every time you drive. Most shattered kneecaps occur in car accidents.  Rest when you are tired. SEEK MEDICAL CARE IF:  You have knee pain that is continual and does not seem to be getting better.  SEEK IMMEDIATE MEDICAL CARE IF:  Your knee joint feels hot to the touch and you have a high fever.  MAKE SURE YOU:   Understand these instructions.  Will watch your condition.  Will get help right away if you are not doing well or get worse. Document Released: 10/01/2007 Document Revised: 02/26/2012 Document Reviewed: 10/01/2007 [facility] Patient Information 2015 [place], [place]. This information is not intended to replace advice given to you by your health care provider. Make sure you discuss any questions you have with your health care provider. Knee Exercises EXERCISES RANGE OF MOTION (ROM) AND STRETCHING EXERCISES These exercises may help you when beginning to rehabilitate your injury. Your symptoms may resolve with or without further  involvement from your physician, physical therapist, or athletic trainer. While completing these exercises, remember:   Restoring tissue flexibility helps normal motion to return to the joints. This allows healthier, less painful movement and activity.  An effective stretch should be held for at least 30 seconds.  A stretch should never be painful. You should only feel a gentle lengthening or release in the stretched tissue. STRETCH - Knee Extension, Prone  Lie on your stomach on a firm surface, such as a bed or countertop. Place your right / left knee and leg just beyond the edge of the surface. You may wish to place a towel under the far end of your right / left thigh for comfort.  Relax your leg muscles and allow gravity to straighten your knee. Your clinician may advise you to add an ankle weight if more resistance is helpful for you.  You should feel a stretch in the back of your right / left knee. Hold this position for __________ seconds. Repeat __________ times. Complete this stretch __________ times per day. * Your physician, physical therapist, or athletic trainer may ask you to add ankle weight to enhance your stretch.  RANGE OF MOTION - Knee Flexion, Active  Lie on your back with both knees straight. (If this causes back discomfort, bend your opposite knee, placing your foot flat on the floor.)  Slowly slide your heel back toward your buttocks until you feel a gentle stretch in the front of your knee or thigh.  Hold for __________ seconds. Slowly slide your heel back to the starting position. Repeat __________ times. Complete this exercise __________ times per day.  STRETCH - Quadriceps, Prone   Lie on your stomach on a firm surface, such as a bed or padded floor.  Bend your right / left knee and grasp your ankle. If you are unable to reach your ankle or pant leg, use a belt around your foot to lengthen your reach.  Gently pull your heel toward your buttocks. Your knee  should not slide out to the side. You should feel a stretch in the front of your thigh and/or knee.  Hold this position for __________ seconds. Repeat __________ times. Complete this stretch __________ times per day.  STRETCH - Hamstrings, Supine   Lie on your back. Loop a belt or towel over the ball of your right / left foot.  Straighten your right / left knee and slowly pull on the belt to raise your leg. Do not allow the right / left knee to bend. Keep your opposite leg flat on the floor.  Raise the leg until you feel a gentle stretch behind your right / left knee or thigh. Hold this position for __________ seconds. Repeat __________ times. Complete this stretch __________ times per day.  STRENGTHENING EXERCISES These exercises may help you when beginning to rehabilitate your injury. They may resolve your symptoms with or without  further involvement from your physician, physical therapist, or athletic trainer. While completing these exercises, remember:   Muscles can gain both the endurance and the strength needed for everyday activities through controlled exercises.  Complete these exercises as instructed by your physician, physical therapist, or athletic trainer. Progress the resistance and repetitions only as guided.  You may experience muscle soreness or fatigue, but the pain or discomfort you are trying to eliminate should never worsen during these exercises. If this pain does worsen, stop and make certain you are following the directions exactly. If the pain is still present after adjustments, discontinue the exercise until you can discuss the trouble with your clinician. STRENGTH - Quadriceps, Isometrics  Lie on your back with your right / left leg extended and your opposite knee bent.  Gradually tense the muscles in the front of your right / left thigh. You should see either your knee cap slide up toward your hip or increased dimpling just above the knee. This motion will push the  back of the knee down toward the floor/mat/bed on which you are lying.  Hold the muscle as tight as you can without increasing your pain for __________ seconds.  Relax the muscles slowly and completely in between each repetition. Repeat __________ times. Complete this exercise __________ times per day.  STRENGTH - Quadriceps, Short Arcs   Lie on your back. Place a __________ inch towel roll under your knee so that the knee slightly bends.  Raise only your lower leg by tightening the muscles in the front of your thigh. Do not allow your thigh to rise.  Hold this position for __________ seconds. Repeat __________ times. Complete this exercise __________ times per day.  OPTIONAL ANKLE WEIGHTS: Begin with ____________________, but DO NOT exceed ____________________. Increase in 1 pound/0.5 kilogram increments.  STRENGTH - Quadriceps, Straight Leg Raises  Quality counts! Watch for signs that the quadriceps muscle is working to insure you are strengthening the correct muscles and not "cheating" by substituting with healthier muscles.  Lay on your back with your right / left leg extended and your opposite knee bent.  Tense the muscles in the front of your right / left thigh. You should see either your knee cap slide up or increased dimpling just above the knee. Your thigh may even quiver.  Tighten these muscles even more and raise your leg 4 to 6 inches off the floor. Hold for __________ seconds.  Keeping these muscles tense, lower your leg.  Relax the muscles slowly and completely in between each repetition. Repeat __________ times. Complete this exercise __________ times per day.  STRENGTH - Hamstring, Curls  Lay on your stomach with your legs extended. (If you lay on a bed, your feet may hang over the edge.)  Tighten the muscles in the back of your thigh to bend your right / left knee up to 90 degrees. Keep your hips flat on the bed/floor.  Hold this position for __________  seconds.  Slowly lower your leg back to the starting position. Repeat __________ times. Complete this exercise __________ times per day.  OPTIONAL ANKLE WEIGHTS: Begin with ____________________, but DO NOT exceed ____________________. Increase in 1 pound/0.5 kilogram increments.  STRENGTH - Quadriceps, Squats  Stand in a door frame so that your feet and knees are in line with the frame.  Use your hands for balance, not support, on the frame.  Slowly lower your weight, bending at the hips and knees. Keep your lower legs upright so that they are  parallel with the door frame. Squat only within the range that does not increase your knee pain. Never let your hips drop below your knees.  Slowly return upright, pushing with your legs, not pulling with your hands. Repeat __________ times. Complete this exercise __________ times per day.  STRENGTH - Quadriceps, Wall Slides  Follow guidelines for form closely. Increased knee pain often results from poorly placed feet or knees.  Lean against a smooth wall or door and walk your feet out 18-24 inches. Place your feet hip-width apart.  Slowly slide down the wall or door until your knees bend __________ degrees.* Keep your knees over your heels, not your toes, and in line with your hips, not falling to either side.  Hold for __________ seconds. Stand up to rest for __________ seconds in between each repetition. Repeat __________ times. Complete this exercise __________ times per day. * Your physician, physical therapist, or athletic trainer will alter this angle based on your symptoms and progress. Document Released: 10/18/2005 Document Revised: 04/20/2014 Document Reviewed: 03/18/2009 [facility] Patient Information 2015 [place]. This information is not intended to replace advice given to you by your health care provider. Make sure you discuss any questions you have with your health care provider.

## 2014-12-14 NOTE — ED Notes (Signed)
Pt c/o right leg pain in back of knee and calf x 1 week; pt denies obvious injury

## 2015-07-05 ENCOUNTER — Encounter (HOSPITAL_COMMUNITY): Payer: Self-pay | Admitting: Emergency Medicine

## 2015-07-05 ENCOUNTER — Emergency Department (HOSPITAL_COMMUNITY)
Admission: EM | Admit: 2015-07-05 | Discharge: 2015-07-05 | Disposition: A | Discharge status: Home / Self Care | Payer: Self-pay | Attending: Emergency Medicine | Admitting: Emergency Medicine

## 2015-07-05 DIAGNOSIS — Z791 Long term (current) use of non-steroidal anti-inflammatories (NSAID): Secondary | ICD-10-CM | POA: Insufficient documentation

## 2015-07-05 DIAGNOSIS — Z720 Tobacco use: Secondary | ICD-10-CM | POA: Insufficient documentation

## 2015-07-05 DIAGNOSIS — J45909 Unspecified asthma, uncomplicated: Secondary | ICD-10-CM | POA: Insufficient documentation

## 2015-07-05 DIAGNOSIS — N938 Other specified abnormal uterine and vaginal bleeding: Secondary | ICD-10-CM | POA: Insufficient documentation

## 2015-07-05 LAB — CBC WITH DIFFERENTIAL/PLATELET
Basophils Absolute: 0 10*3/uL (ref 0.0–0.1)
Basophils Relative: 0 % (ref 0–1)
EOS ABS: 0.1 10*3/uL (ref 0.0–0.7)
Eosinophils Relative: 2 % (ref 0–5)
HEMATOCRIT: 42.6 % (ref 36.0–46.0)
Hemoglobin: 14.3 g/dL (ref 12.0–15.0)
LYMPHS ABS: 3.6 10*3/uL (ref 0.7–4.0)
Lymphocytes Relative: 52 % — ABNORMAL HIGH (ref 12–46)
MCH: 31.4 pg (ref 26.0–34.0)
MCHC: 33.6 g/dL (ref 30.0–36.0)
MCV: 93.4 fL (ref 78.0–100.0)
Monocytes Absolute: 0.4 10*3/uL (ref 0.1–1.0)
Monocytes Relative: 5 % (ref 3–12)
NEUTROS ABS: 2.8 10*3/uL (ref 1.7–7.7)
Neutrophils Relative %: 41 % — ABNORMAL LOW (ref 43–77)
Platelets: 205 10*3/uL (ref 150–400)
RBC: 4.56 MIL/uL (ref 3.87–5.11)
RDW: 14.6 % (ref 11.5–15.5)
WBC: 6.9 10*3/uL (ref 4.0–10.5)

## 2015-07-05 LAB — BASIC METABOLIC PANEL
Anion gap: 5 (ref 5–15)
BUN: 14 mg/dL (ref 6–20)
CHLORIDE: 108 mmol/L (ref 101–111)
CO2: 26 mmol/L (ref 22–32)
CREATININE: 0.97 mg/dL (ref 0.44–1.00)
Calcium: 9.1 mg/dL (ref 8.9–10.3)
GFR calc Af Amer: >60 mL/min (ref >60)
Glucose, Bld: 124 mg/dL — ABNORMAL HIGH (ref 65–99)
Potassium: 4.4 mmol/L (ref 3.5–5.1)
SODIUM: 139 mmol/L (ref 135–145)

## 2015-07-05 NOTE — ED Notes (Signed)
Pt c/o having period for 2-3 months constantly.  Pt states that at first she just thought menopause and dealt with it but since it has been constant for several months states that she wants to be checked.  Pt states that she hasnt seen GYN doctor in "quite a while".

## 2015-07-05 NOTE — ED Notes (Signed)
Awake. Verbally responsive. A/O x4. Resp even and unlabored. No audible adventitious breath sounds noted. ABC's intact. IV saline lock patent and intact. 

## 2015-07-05 NOTE — Discharge Instructions (Signed)
Abnormal Uterine Bleeding Abnormal uterine bleeding can affect women at various stages in life, including teenagers, women in their reproductive years, pregnant women, and women who have reached menopause. Several kinds of uterine bleeding are considered abnormal, including:  Bleeding or spotting between periods.   Bleeding after sexual intercourse.   Bleeding that is heavier or more than normal.   Periods that last longer than usual.  Bleeding after menopause.  Many cases of abnormal uterine bleeding are minor and simple to treat, while others are more serious. Any type of abnormal bleeding should be evaluated by your health care provider. Treatment will depend on the cause of the bleeding. HOME CARE INSTRUCTIONS Monitor your condition for any changes. The following actions may help to alleviate any discomfort you are experiencing:  Avoid the use of tampons and douches as directed by your health care provider.  Change your pads frequently. You should get regular pelvic exams and Pap tests. Keep all follow-up appointments for diagnostic tests as directed by your health care provider.  SEEK MEDICAL CARE IF:   Your bleeding lasts more than 1 week.   You feel dizzy at times.  SEEK IMMEDIATE MEDICAL CARE IF:   You pass out.   You are changing pads every 15 to 30 minutes.   You have abdominal pain.  You have a fever.   You become sweaty or weak.   You are passing large blood clots from the vagina.   You start to feel nauseous and vomit. MAKE SURE YOU:   Understand these instructions.  Will watch your condition.  Will get help right away if you are not doing well or get worse. Document Released: 12/04/2005 Document Revised: 12/09/2013 Document Reviewed: 07/03/2013 [employer] Patient Information 2015 [employer]. This information is not intended to replace advice given to you by your health care provider. Make sure you discuss any questions you have with your  health care provider.  

## 2015-07-05 NOTE — ED Notes (Addendum)
Pt reported irregular ongoing menstrual for past 3 months. Pt reported that menstrual has been heavy mostly but lightens up. She has used x3 pads in past 1hr. Pt stated that she did not have a menstrual x1 year. Pt reported fatigue and lightheadedness and blurred vision. Denies headaches. Denies abd pain and urinary problems.

## 2015-07-05 NOTE — ED Notes (Signed)
Awake. Verbally responsive. A/O x4. Resp even and unlabored. No audible adventitious breath sounds noted. ABC's intact.  

## 2015-07-05 NOTE — ED Notes (Signed)
ED tech and [place], [place] at bedside to do pelvic exam.

## 2015-07-05 NOTE — ED Provider Notes (Signed)
CSN: [id number]     Arrival date & time 07/05/15  0846 History   First MD Initiated Contact with Patient 07/05/15 [phone]     Chief Complaint  Patient presents with  . Menstrual Problem     (Consider location/radiation/quality/duration/timing/severity/associated sxs/prior Treatment) Patient is a 49 y.o. female presenting with vaginal bleeding. The history is provided by the patient. No language interpreter was used.  Vaginal Bleeding Quality:  Clots Severity:  Moderate Onset quality:  Unable to specify Duration:  3 months Timing:  Constant Progression:  Unchanged Menstrual history:  Irregular Number of pads used:  Every 1-1.5 hrs Possible pregnancy: no   Context: spontaneously   Relieved by:  Nothing Worsened by:  Nothing tried Ineffective treatments:  None tried Associated symptoms: no abdominal pain, no back pain, no dysuria, no fatigue, no fever and no nausea   Risk factors: no bleeding disorder, no hx of ectopic pregnancy, no hx of endometriosis and no gynecological surgery     Past Medical History  Diagnosis Date  . Asthma    Past Surgical History  Procedure Laterality Date  . Tubal ligation     No family history on file. History  Substance Use Topics  . Smoking status: Current Every Day Smoker -- 1.00 packs/day    Types: Cigarettes  . Smokeless tobacco: Not on file  . Alcohol Use: Yes     Comment: occ   OB History    No data available     Review of Systems  Constitutional: Negative for fever, chills, diaphoresis, activity change, appetite change and fatigue.  HENT: Negative for congestion, facial swelling, rhinorrhea and sore throat.   Eyes: Negative for photophobia and discharge.  Respiratory: Negative for cough, chest tightness and shortness of breath.   Cardiovascular: Negative for chest pain, palpitations and leg swelling.  Gastrointestinal: Negative for nausea, vomiting, abdominal pain and diarrhea.  Endocrine: Negative for polydipsia and polyuria.   Genitourinary: Positive for vaginal bleeding. Negative for dysuria, frequency, difficulty urinating and pelvic pain.  Musculoskeletal: Negative for back pain, arthralgias, neck pain and neck stiffness.  Skin: Negative for color change and wound.  Allergic/Immunologic: Negative for immunocompromised state.  Neurological: Negative for facial asymmetry, weakness, numbness and headaches.  Hematological: Does not bruise/bleed easily.  Psychiatric/Behavioral: Negative for confusion and agitation.      Allergies  Hydrocodone  Home Medications   Prior to Admission medications   Medication Sig Start Date End Date Taking? Authorizing Provider  naproxen (NAPROSYN) 500 MG tablet Take 1 tablet (500 mg total) by mouth 2 (two) times daily. Patient taking differently: Take 500 mg by mouth daily as needed for moderate pain.  12/14/14  Yes Tatyana Kirichenko, PA-C  benzonatate (TESSALON) 100 MG capsule Take 1 capsule (100 mg total) by mouth every 8 (eight) hours. Patient not taking: Reported on 07/05/2015 02/02/14   Charlestine Night, PA-C  Guaifenesin 1200 MG TB12 Take 1 tablet (1,200 mg total) by mouth 2 (two) times daily. Patient not taking: Reported on 07/05/2015 02/02/14   Charlestine Night, PA-C  promethazine-dextromethorphan (PROMETHAZINE-DM) 6.25-15 MG/5ML syrup Take 5 mLs by mouth 4 (four) times daily as needed for cough. Patient not taking: Reported on 07/05/2015 02/02/14   Charlestine Night, PA-C  traMADol (ULTRAM) 50 MG tablet Take 1 tablet (50 mg total) by mouth every 6 (six) hours as needed. Patient not taking: Reported on 07/05/2015 12/14/14   Tatyana Kirichenko, PA-C   BP 106/72 mmHg  Pulse 78  Temp(Src) 98 F (36.7 C) (Oral)  Resp 18  SpO2 98% Physical Exam  Constitutional: She is oriented to person, place, and time. She appears well-developed and well-nourished. No distress.  HENT:  Head: Normocephalic and atraumatic.  Mouth/Throat: No oropharyngeal exudate.  Eyes: Pupils are  equal, round, and reactive to light.  Neck: Normal range of motion. Neck supple.  Cardiovascular: Normal rate, regular rhythm and normal heart sounds.  Exam reveals no gallop and no friction rub.   No murmur heard. Pulmonary/Chest: Effort normal and breath sounds normal. No respiratory distress. She has no wheezes. She has no rales.  Abdominal: Soft. Bowel sounds are normal. She exhibits no distension and no mass. There is no tenderness. There is no rebound and no guarding.  Genitourinary:  Moderate clotted blood from closed Os  Musculoskeletal: Normal range of motion. She exhibits no edema or tenderness.  Neurological: She is alert and oriented to person, place, and time.  Skin: Skin is warm and dry.  Psychiatric: She has a normal mood and affect.    ED Course  Procedures (including critical care time) Labs Review Labs Reviewed  CBC WITH DIFFERENTIAL/PLATELET - Abnormal; Notable for the following:    Neutrophils Relative % 41 (*)    Lymphocytes Relative 52 (*)    All other components within normal limits  BASIC METABOLIC PANEL - Abnormal; Notable for the following:    Glucose, Bld 124 (*)    All other components within normal limits  WET PREP, GENITAL  GC/CHLAMYDIA PROBE AMP ([place]) NOT AT [facility]    Imaging Review No results found.   EKG Interpretation None      MDM   Final diagnoses:  DUB (dysfunctional uterine bleeding)    Pt is a 49 y.o. female with Pmhx as above who presents with 3 months of daily vaginal bleeding, changing pad up to every 1-1.5 hrs. Denies CP, SOB, palpitations, has had some fatigue, lightheadedness.  No easy bruising, bleeding, no fevers, no weight change.  On PE, VSS, pt in NAD. Ab exam benign. GU with moderate amt of bleeding from Os. No CMT, adnexal ot uterine tenderness or fullness.  We spoke about need for outpt GYN f/u, outpt US for possible uterine hyperplasia. We also spoke about possible hormone therapy for acute vaginal bleeding which  she would like to avoid given her age, weight and smoking.    Hb 14.3. I believe pt stable to [facility]   Esaw GrandchildLeah Fidel evaluation in the Emergency Department is complete. It has been determined that no acute conditions requiring further emergency intervention are present at this time. The patient/guardian have been advised of the diagnosis and plan. We have discussed signs and symptoms that warrant return to the ED, such as changes or worsening in symptoms, worsening bleeding, CP, SOB, passing out.     Toy CookeyMegan Docherty, MD 07/05/15 1102

## 2016-02-16 ENCOUNTER — Encounter (HOSPITAL_COMMUNITY): Payer: Self-pay

## 2016-02-16 ENCOUNTER — Emergency Department (HOSPITAL_COMMUNITY)
Admission: EM | Admit: 2016-02-16 | Discharge: 2016-02-16 | Disposition: A | Discharge status: Home / Self Care | Payer: No Typology Code available for payment source | Attending: Emergency Medicine | Admitting: Emergency Medicine

## 2016-02-16 DIAGNOSIS — J029 Acute pharyngitis, unspecified: Secondary | ICD-10-CM | POA: Insufficient documentation

## 2016-02-16 DIAGNOSIS — Z8669 Personal history of other diseases of the nervous system and sense organs: Secondary | ICD-10-CM

## 2016-02-16 DIAGNOSIS — Z791 Long term (current) use of non-steroidal anti-inflammatories (NSAID): Secondary | ICD-10-CM | POA: Insufficient documentation

## 2016-02-16 DIAGNOSIS — F17210 Nicotine dependence, cigarettes, uncomplicated: Secondary | ICD-10-CM | POA: Insufficient documentation

## 2016-02-16 DIAGNOSIS — J45909 Unspecified asthma, uncomplicated: Secondary | ICD-10-CM | POA: Insufficient documentation

## 2016-02-16 DIAGNOSIS — H578 Other specified disorders of eye and adnexa: Secondary | ICD-10-CM | POA: Insufficient documentation

## 2016-02-16 DIAGNOSIS — Z720 Tobacco use: Secondary | ICD-10-CM

## 2016-02-16 DIAGNOSIS — H9201 Otalgia, right ear: Secondary | ICD-10-CM

## 2016-02-16 LAB — RAPID STREP SCREEN (MED CTR MEBANE ONLY): Streptococcus, Group A Screen (Direct): NEGATIVE

## 2016-02-16 MED ORDER — PHENOL 1.4 % MT LIQD
1.0000 | OROMUCOSAL | Status: DC | PRN
  Administered 2016-02-16: 1 via OROMUCOSAL
  Filled 2016-02-16: qty 177

## 2016-02-16 NOTE — ED Notes (Signed)
Pt c/o sore throat starting this morning.  Pain score 4/10.  Pt has not taken anything for symptoms.  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

## 2016-02-16 NOTE — ED Provider Notes (Signed)
CSN: [id number]     Arrival date & time 02/16/16  1017 History   First MD Initiated Contact with Patient 02/16/16 1114     Chief Complaint  Patient presents with  . Sore Throat     (Consider location/radiation/quality/duration/timing/severity/associated sxs/prior Treatment) HPI Comments: Erika Wong is a 50 y.o. female with a PMHx of asthma, who presents to the ED with complaints of sore throat that began around 4 AM. She describes the pain as 4/10 burning in the posterior aspect of her throat radiating into the right ear, intermittent only occurring with swallowing, worse with swallowing, with no treatments tried prior to arrival. 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 Associated symptoms include eye itching and right ear pain. She is a smoker. She wants to be tested for strep today.  She denies any fevers, chills, rhinorrhea, eye drainage, ear drainage, cough, drooling, trismus, chest pain, shortness of breath, abdominal pain, nausea, vomiting, diarrhea, constipation, dysuria, hematuria, numbness, tingling, or focal weakness.  Patient is a 50 y.o. female presenting with pharyngitis. The history is provided by the patient. No language interpreter was used.  Sore Throat This is a new problem. The current episode started today. The problem occurs intermittently. The problem has been unchanged. Associated symptoms include a sore throat. Pertinent negatives include no abdominal pain, arthralgias, chest pain, chills, coughing, fever, myalgias, nausea, numbness, urinary symptoms, vomiting or weakness. The symptoms are aggravated by swallowing. She has tried nothing for the symptoms. The treatment provided no relief.    Past Medical History  Diagnosis Date  . Asthma    Past Surgical History  Procedure Laterality Date  . Tubal ligation     History reviewed. No pertinent family history. Social History  Substance Use Topics  .  Smoking status: Current Every Day Smoker -- 1.00 packs/day    Types: Cigarettes  . Smokeless tobacco: None  . Alcohol Use: Yes     Comment: occ   OB History    No data available     Review of Systems  Constitutional: Negative for fever and chills.  HENT: Positive for ear pain (right) and sore throat. Negative for drooling, ear discharge, rhinorrhea and trouble swallowing.   Eyes: Positive for itching. Negative for pain, discharge and redness.  Respiratory: Negative for cough and shortness of breath.   Cardiovascular: Negative for chest pain.  Gastrointestinal: Negative for nausea, vomiting, abdominal pain, diarrhea and constipation.  Genitourinary: Negative for dysuria and hematuria.  Musculoskeletal: Negative for myalgias and arthralgias.  Skin: Negative for color change.  Allergic/Immunologic: Negative for immunocompromised state.  Neurological: Negative for weakness and numbness.  Psychiatric/Behavioral: Negative for confusion.   10 Systems reviewed and are negative for acute change except as noted in the HPI.    Allergies  Hydrocodone  Home Medications   Prior to Admission medications   Medication Sig Start Date End Date Taking? Authorizing Provider  benzonatate (TESSALON) 100 MG capsule Take 1 capsule (100 mg total) by mouth every 8 (eight) hours. Patient not taking: Reported on 07/05/2015 02/02/14   Charlestine Night, PA-C  Guaifenesin 1200 MG TB12 Take 1 tablet (1,200 mg total) by mouth 2 (two) times daily. Patient not taking: Reported on 07/05/2015 02/02/14   Charlestine Night, PA-C  naproxen (NAPROSYN) 500 MG tablet Take 1 tablet (500 mg total) by mouth 2 (two) times daily. Patient taking differently: Take 500 mg by mouth daily as needed for moderate pain.  12/14/14  Tatyana Kirichenko, PA-C  promethazine-dextromethorphan (PROMETHAZINE-DM) 6.25-15 MG/5ML syrup Take 5 mLs by mouth 4 (four) times daily as needed for cough. Patient not taking: Reported on 07/05/2015  02/02/14   Charlestine Night, PA-C  traMADol (ULTRAM) 50 MG tablet Take 1 tablet (50 mg total) by mouth every 6 (six) hours as needed. Patient not taking: Reported on 07/05/2015 12/14/14   Tatyana Kirichenko, PA-C   BP 120/90 mmHg  Pulse 86  Temp(Src) 98.2 F (36.8 C) (Oral)  Resp 16  SpO2 98%  LMP 02/16/2016 Physical Exam  Constitutional: She is oriented to person, place, and time. Vital signs are normal. She appears well-developed and well-nourished.  Non-toxic appearance. No distress.  Afebrile, nontoxic, NAD  HENT:  Head: Normocephalic and atraumatic.  Right Ear: Hearing, tympanic membrane, external ear and ear canal normal.  Left Ear: Hearing, tympanic membrane, external ear and ear canal normal.  Nose: Nose normal.  Mouth/Throat: Uvula is midline and mucous membranes are normal. No trismus in the jaw. No uvula swelling. Posterior oropharyngeal erythema present. No oropharyngeal exudate, posterior oropharyngeal edema or tonsillar abscesses.  Ears are clear bilaterally. Nose clear. Oropharynx mildly injected, moist, without uvular swelling or deviation, no trismus or drooling, no tonsillar swelling, no exudates.  No PTA  Eyes: Conjunctivae and EOM are normal. Right eye exhibits no discharge. Left eye exhibits no discharge.  Neck: Normal range of motion. Neck supple.  Cardiovascular: Normal rate and intact distal pulses.   Pulmonary/Chest: Effort normal. No respiratory distress.  Abdominal: Normal appearance. She exhibits no distension.  Musculoskeletal: Normal range of motion.  Lymphadenopathy:       Head (right side): Tonsillar adenopathy present.       Head (left side): Tonsillar adenopathy present.    She has no cervical adenopathy.  Mild b/l tonsillar LAD which is mildly TTP  Neurological: She is alert and oriented to person, place, and time. She has normal strength. No sensory deficit.  Skin: Skin is warm, dry and intact. No rash noted.  Psychiatric: She has a normal mood  and affect. Her behavior is normal.  Nursing note and vitals reviewed.   ED Course  Procedures (including critical care time) Labs Review Labs Reviewed  RAPID STREP SCREEN (NOT AT [facility])  CULTURE, GROUP A STREP [facility])    Imaging Review No results found. I have personally reviewed and evaluated these images and lab results as part of my medical decision-making.   EKG Interpretation None      MDM   Final diagnoses:  Sore throat  Tobacco user  Otalgia, right  History of itching of eye    50 y.o. female here with sore throat x7 hours with associated eye itching and R ear pain. +Sick contacts at school. On exam, mildly erythematous throat without tonsillar swelling or exudates, mild tonsillar LAD, no fevers. Ears clear. Will obtain RST since pt is requesting this, but given presentation it seems most consistent with viral/allergic illness. Will give chloraseptic spray and await RST.   12:32 PM RST neg. Doubt need for empiric tx of pharyngitis, likely viral/allergic. Discussed OTC meds for symptom control, as well as chloraseptic spray. F/up with [facility] in 1wk to establish care and recheck symptoms. Tobacco cessation discussed. I explained the diagnosis and have given explicit precautions to return to the ER including for any other new or worsening symptoms. The patient understands and accepts the medical plan as it's been dictated and I have answered their questions. Discharge instructions concerning home care and prescriptions have been  given. The patient is STABLE and is discharged to home in good condition.  BP 120/90 mmHg  Pulse 86  Temp(Src) 98.2 F (36.8 C) (Oral)  Resp 16  SpO2 98%  LMP 02/16/2016  Meds ordered this encounter  Medications  . phenol (CHLORASEPTIC) mouth spray 1 spray    Sig:        Erika Orellana Camprubi-Soms, PA-C 02/16/16 1233  Mancel Bale, MD 02/16/16 [phone]

## 2016-02-16 NOTE — Discharge Instructions (Signed)
Your strep test was negative today, it will be cultured and if it is positive then the lab will call you. Continue to stay well-hydrated. Gargle warm salt water and spit it out. Use chloraseptic spray as needed for sore throat. Continue to alternate between Tylenol and Ibuprofen for pain or fever. Use Mucinex for cough suppression/expectoration of mucus. Use netipot and flonase to help with nasal congestion. May consider over-the-counter Benadryl or other antihistamine to decrease secretions and for watery itchy eyes. STOP SMOKING! Followup with [facility] in 5-7 days for recheck of ongoing symptoms and to establish care. Return to emergency department for emergent changing or worsening of symptoms.   Pharyngitis Pharyngitis is redness, pain, and swelling (inflammation) of your pharynx.  CAUSES  Pharyngitis is usually caused by infection. Most of the time, these infections are from viruses (viral) and are part of a cold. However, sometimes pharyngitis is caused by bacteria (bacterial). Pharyngitis can also be caused by allergies. Viral pharyngitis may be spread from person to person by coughing, sneezing, and personal items or utensils (cups, forks, spoons, toothbrushes). Bacterial pharyngitis may be spread from person to person by more intimate contact, such as kissing.  SIGNS AND SYMPTOMS  Symptoms of pharyngitis include:   Sore throat.   Tiredness (fatigue).   Low-grade fever.   Headache.  Joint pain and muscle aches.  Skin rashes.  Swollen lymph nodes.  Plaque-like film on throat or tonsils (often seen with bacterial pharyngitis). DIAGNOSIS  Your health care provider will ask you questions about your illness and your symptoms. Your medical history, along with a physical exam, is often all that is needed to diagnose pharyngitis. Sometimes, a rapid strep test is done. Other lab tests may also be done, depending on the suspected cause.  TREATMENT  Viral pharyngitis will  usually get better in 3-4 days without the use of medicine. Bacterial pharyngitis is treated with medicines that kill germs (antibiotics).  HOME CARE INSTRUCTIONS   Drink enough water and fluids to keep your urine clear or pale yellow.   Only take over-the-counter or prescription medicines as directed by your health care provider:   If you are prescribed antibiotics, make sure you finish them even if you start to feel better.   Do not take aspirin.   Get lots of rest.   Gargle with 8 oz of salt water ( tsp of salt per 1 qt of water) as often as every 1-2 hours to soothe your throat.   Throat lozenges (if you are not at risk for choking) or sprays may be used to soothe your throat. SEEK MEDICAL CARE IF:   You have large, tender lumps in your neck.  You have a rash.  You cough up green, yellow-brown, or bloody spit. SEEK IMMEDIATE MEDICAL CARE IF:   Your neck becomes stiff.  You drool or are unable to swallow liquids.  You vomit or are unable to keep medicines or liquids down.  You have severe pain that does not go away with the use of recommended medicines.  You have trouble breathing (not caused by a stuffy nose). MAKE SURE YOU:   Understand these instructions.  Will watch your condition.  Will get help right away if you are not doing well or get worse.   This information is not intended to replace advice given to you by your health care provider. Make sure you discuss any questions you have with your health care provider.   Document Released: 12/04/2005 Document Revised:  09/24/2013 Document Reviewed: 08/11/2013 Elsevier Interactive Patient Education 2016 [employer]  Sore Throat A sore throat is pain, burning, irritation, or scratchiness of the throat. There is often pain or tenderness when swallowing or talking. A sore throat may be accompanied by other symptoms, such as coughing, sneezing, fever, and swollen neck glands. A sore throat is often the first  sign of another sickness, such as a cold, flu, strep throat, or mononucleosis (commonly known as mono). Most sore throats go away without medical treatment. CAUSES  The most common causes of a sore throat include:  A viral infection, such as a cold, flu, or mono.  A bacterial infection, such as strep throat, tonsillitis, or whooping cough.  Seasonal allergies.  Dryness in the air.  Irritants, such as smoke or pollution.  Gastroesophageal reflux disease (GERD). HOME CARE INSTRUCTIONS   Only take over-the-counter medicines as directed by your caregiver.  Drink enough fluids to keep your urine clear or pale yellow.  Rest as needed.  Try using throat sprays, lozenges, or sucking on hard candy to ease any pain (if older than 4 years or as directed).  Sip warm liquids, such as broth, herbal tea, or warm water with honey to relieve pain temporarily. You may also eat or drink cold or frozen liquids such as frozen ice pops.  Gargle with salt water (mix 1 tsp salt with 8 oz of water).  Do not smoke and avoid secondhand smoke.  Put a cool-mist humidifier in your bedroom at night to moisten the air. You can also turn on a hot shower and sit in the bathroom with the door closed for 5-10 minutes. SEEK IMMEDIATE MEDICAL CARE IF:  You have difficulty breathing.  You are unable to swallow fluids, soft foods, or your saliva.  You have increased swelling in the throat.  Your sore throat does not get better in 7 days.  You have nausea and vomiting.  You have a fever or persistent symptoms for more than 2-3 days.  You have a fever and your symptoms suddenly get worse. MAKE SURE YOU:   Understand these instructions.  Will watch your condition.  Will get help right away if you are not doing well or get worse.   This information is not intended to replace advice given to you by your health care provider. Make sure you discuss any questions you have with your health care provider.     Document Released: 01/11/2005 Document Revised: 12/25/2014 Document Reviewed: 08/11/2012 Elsevier Interactive Patient Education 2016 [employer]  Upper Respiratory Infection, Adult Most upper respiratory infections (URIs) are a viral infection of the air passages leading to the lungs. A URI affects the nose, throat, and upper air passages. The most common type of URI is nasopharyngitis and is typically referred to as "the common cold." URIs run their course and usually go away on their own. Most of the time, a URI does not require medical attention, but sometimes a bacterial infection in the upper airways can follow a viral infection. This is called a secondary infection. Sinus and middle ear infections are common types of secondary upper respiratory infections. Bacterial pneumonia can also complicate a URI. A URI can worsen asthma and chronic obstructive pulmonary disease (COPD). Sometimes, these complications can require emergency medical care and may be life threatening.  CAUSES Almost all URIs are caused by viruses. A virus is a type of germ and can spread from one person to another.  RISKS FACTORS You may be at risk  for a URI if:   You smoke.   You have chronic heart or lung disease.  You have a weakened defense (immune) system.   You are very young or very old.   You have nasal allergies or asthma.  You work in crowded or poorly ventilated areas.  You work in health care facilities or schools. SIGNS AND SYMPTOMS  Symptoms typically develop 2-3 days after you come in contact with a cold virus. Most viral URIs last 7-10 days. However, viral URIs from the influenza virus (flu virus) can last 14-18 days and are typically more severe. Symptoms may include:   Runny or stuffy (congested) nose.   Sneezing.   Cough.   Sore throat.   Headache.   Fatigue.   Fever.   Loss of appetite.   Pain in your forehead, behind your eyes, and over your cheekbones (sinus  pain).  Muscle aches.  DIAGNOSIS  Your health care provider may diagnose a URI by:  Physical exam.  Tests to check that your symptoms are not due to another condition such as:  Strep throat.  Sinusitis.  Pneumonia.  Asthma. TREATMENT  A URI goes away on its own with time. It cannot be cured with medicines, but medicines may be prescribed or recommended to relieve symptoms. Medicines may help:  Reduce your fever.  Reduce your cough.  Relieve nasal congestion. HOME CARE INSTRUCTIONS   Take medicines only as directed by your health care provider.   Gargle warm saltwater or take cough drops to comfort your throat as directed by your health care provider.  Use a warm mist humidifier or inhale steam from a shower to increase air moisture. This may make it easier to breathe.  Drink enough fluid to keep your urine clear or pale yellow.   Eat soups and other clear broths and maintain good nutrition.   Rest as needed.   Return to work when your temperature has returned to normal or as your health care provider advises. You may need to stay home longer to avoid infecting others. You can also use a face mask and careful hand washing to prevent spread of the virus.  Increase the usage of your inhaler if you have asthma.   Do not use any tobacco products, including cigarettes, chewing tobacco, or electronic cigarettes. If you need help quitting, ask your health care provider. PREVENTION  The best way to protect yourself from getting a cold is to practice good hygiene.   Avoid oral or hand contact with people with cold symptoms.   Wash your hands often if contact occurs.  There is no clear evidence that vitamin C, vitamin E, echinacea, or exercise reduces the chance of developing a cold. However, it is always recommended to get plenty of rest, exercise, and practice good nutrition.  SEEK MEDICAL CARE IF:   You are getting worse rather than better.   Your symptoms are  not controlled by medicine.   You have chills.  You have worsening shortness of breath.  You have brown or red mucus.  You have yellow or brown nasal discharge.  You have pain in your face, especially when you bend forward.  You have a fever.  You have swollen neck glands.  You have pain while swallowing.  You have white areas in the back of your throat. SEEK IMMEDIATE MEDICAL CARE IF:   You have severe or persistent:  Headache.  Ear pain.  Sinus pain.  Chest pain.  You have chronic lung disease  and any of the following:  Wheezing.  Prolonged cough.  Coughing up blood.  A change in your usual mucus.  You have a stiff neck.  You have changes in your:  Vision.  Hearing.  Thinking.  Mood. MAKE SURE YOU:   Understand these instructions.  Will watch your condition.  Will get help right away if you are not doing well or get worse.   This information is not intended to replace advice given to you by your health care provider. Make sure you discuss any questions you have with your health care provider.   Document Released: 05/30/2001 Document Revised: 04/20/2015 Document Reviewed: 03/11/2014 Elsevier Interactive Patient Education 2016 [employer].  Smoking Cessation, Tips for Success If you are ready to quit smoking, congratulations! You have chosen to help yourself be healthier. Cigarettes bring nicotine, tar, carbon monoxide, and other irritants into your body. Your lungs, heart, and blood vessels will be able to work better without these poisons. There are many different ways to quit smoking. Nicotine gum, nicotine patches, a nicotine inhaler, or nicotine nasal spray can help with physical craving. Hypnosis, support groups, and medicines help break the habit of smoking. WHAT THINGS CAN I DO TO MAKE QUITTING EASIER?  Here are some tips to help you quit for good:  Pick a date when you will quit smoking completely. Tell all of your friends and family  about your plan to quit on that date.  Do not try to slowly cut down on the number of cigarettes you are smoking. Pick a quit date and quit smoking completely starting on that day.  Throw away all cigarettes.   Clean and remove all ashtrays from your home, work, and car.  On a card, write down your reasons for quitting. Carry the card with you and read it when you get the urge to smoke.  Cleanse your body of nicotine. Drink enough water and fluids to keep your urine clear or pale yellow. Do this after quitting to flush the nicotine from your body.  Learn to predict your moods. Do not let a bad situation be your excuse to have a cigarette. Some situations in your life might tempt you into wanting a cigarette.  Never have "just one" cigarette. It leads to wanting another and another. Remind yourself of your decision to quit.  Change habits associated with smoking. If you smoked while driving or when feeling stressed, try other activities to replace smoking. Stand up when drinking your coffee. Brush your teeth after eating. Sit in a different chair when you read the paper. Avoid alcohol while trying to quit, and try to drink fewer caffeinated beverages. Alcohol and caffeine may urge you to smoke.  Avoid foods and drinks that can trigger a desire to smoke, such as sugary or spicy foods and alcohol.  Ask people who smoke not to smoke around you.  Have something planned to do right after eating or having a cup of coffee. For example, plan to take a walk or exercise.  Try a relaxation exercise to calm you down and decrease your stress. Remember, you may be tense and nervous for the first 2 weeks after you quit, but this will pass.  Find new activities to keep your hands busy. Play with a pen, coin, or rubber band. Doodle or draw things on paper.  Brush your teeth right after eating. This will help cut down on the craving for the taste of tobacco after meals. You can also try mouthwash.  Use  oral substitutes in place of cigarettes. Try using lemon drops, carrots, cinnamon sticks, or chewing gum. Keep them handy so they are available when you have the urge to smoke.  When you have the urge to smoke, try deep breathing.  Designate your home as a nonsmoking area.  If you are a heavy smoker, ask your health care provider about a prescription for nicotine chewing gum. It can ease your withdrawal from nicotine.  Reward yourself. Set aside the cigarette money you save and buy yourself something nice.  Look for support from others. Join a support group or smoking cessation program. Ask someone at home or at work to help you with your plan to quit smoking.  Always ask yourself, "Do I need this cigarette or is this just a reflex?" Tell yourself, "Today, I choose not to smoke," or "I do not want to smoke." You are reminding yourself of your decision to quit.  Do not replace cigarette smoking with electronic cigarettes (commonly called e-cigarettes). The safety of e-cigarettes is unknown, and some may contain harmful chemicals.  If you relapse, do not give up! Plan ahead and think about what you will do the next time you get the urge to smoke. HOW WILL I FEEL WHEN I QUIT SMOKING? You may have symptoms of withdrawal because your body is used to nicotine (the addictive substance in cigarettes). You may crave cigarettes, be irritable, feel very hungry, cough often, get headaches, or have difficulty concentrating. The withdrawal symptoms are only temporary. They are strongest when you first quit but will go away within 10-14 days. When withdrawal symptoms occur, stay in control. Think about your reasons for quitting. Remind yourself that these are signs that your body is healing and getting used to being without cigarettes. Remember that withdrawal symptoms are easier to treat than the major diseases that smoking can cause.  Even after the withdrawal is over, expect periodic urges to smoke. However,  these cravings are generally short lived and will go away whether you smoke or not. Do not smoke! WHAT RESOURCES ARE AVAILABLE TO HELP ME QUIT SMOKING? Your health care provider can direct you to community resources or hospitals for support, which may include:  Group support.  Education.  Hypnosis.  Therapy.   This information is not intended to replace advice given to you by your health care provider. Make sure you discuss any questions you have with your health care provider.   Document Released: 09/01/2004 Document Revised: 12/25/2014 Document Reviewed: 05/22/2013 Elsevier Interactive Patient Education [employer]

## 2016-02-19 LAB — CULTURE, GROUP A STREP (THRC)

## 2016-03-09 ENCOUNTER — Encounter (HOSPITAL_COMMUNITY): Payer: Self-pay | Admitting: Cardiology

## 2016-03-09 ENCOUNTER — Emergency Department (HOSPITAL_COMMUNITY): Payer: Self-pay

## 2016-03-09 ENCOUNTER — Inpatient Hospital Stay (HOSPITAL_COMMUNITY)
Admission: EM | Admit: 2016-03-09 | Discharge: 2016-03-10 | DRG: 065 | Disposition: A | Discharge status: Home / Self Care | Payer: Self-pay | Attending: Internal Medicine | Admitting: Internal Medicine

## 2016-03-09 ENCOUNTER — Observation Stay (HOSPITAL_COMMUNITY): Payer: Self-pay

## 2016-03-09 DIAGNOSIS — F1290 Cannabis use, unspecified, uncomplicated: Secondary | ICD-10-CM | POA: Diagnosis present

## 2016-03-09 DIAGNOSIS — Z720 Tobacco use: Secondary | ICD-10-CM | POA: Diagnosis present

## 2016-03-09 DIAGNOSIS — N390 Urinary tract infection, site not specified: Secondary | ICD-10-CM

## 2016-03-09 DIAGNOSIS — I639 Cerebral infarction, unspecified: Secondary | ICD-10-CM | POA: Diagnosis present

## 2016-03-09 DIAGNOSIS — J45909 Unspecified asthma, uncomplicated: Secondary | ICD-10-CM | POA: Diagnosis present

## 2016-03-09 DIAGNOSIS — Z7289 Other problems related to lifestyle: Secondary | ICD-10-CM

## 2016-03-09 DIAGNOSIS — I671 Cerebral aneurysm, nonruptured: Secondary | ICD-10-CM | POA: Diagnosis present

## 2016-03-09 DIAGNOSIS — Z791 Long term (current) use of non-steroidal anti-inflammatories (NSAID): Secondary | ICD-10-CM

## 2016-03-09 DIAGNOSIS — G459 Transient cerebral ischemic attack, unspecified: Secondary | ICD-10-CM

## 2016-03-09 DIAGNOSIS — Z6841 Body Mass Index (BMI) 40.0 and over, adult: Secondary | ICD-10-CM

## 2016-03-09 DIAGNOSIS — J4520 Mild intermittent asthma, uncomplicated: Secondary | ICD-10-CM

## 2016-03-09 DIAGNOSIS — R4781 Slurred speech: Secondary | ICD-10-CM | POA: Diagnosis present

## 2016-03-09 DIAGNOSIS — E785 Hyperlipidemia, unspecified: Secondary | ICD-10-CM | POA: Diagnosis present

## 2016-03-09 DIAGNOSIS — I6340 Cerebral infarction due to embolism of unspecified cerebral artery: Principal | ICD-10-CM | POA: Diagnosis present

## 2016-03-09 DIAGNOSIS — F17210 Nicotine dependence, cigarettes, uncomplicated: Secondary | ICD-10-CM | POA: Diagnosis present

## 2016-03-09 DIAGNOSIS — G8191 Hemiplegia, unspecified affecting right dominant side: Secondary | ICD-10-CM | POA: Diagnosis present

## 2016-03-09 HISTORY — DX: Tobacco use: Z72.0

## 2016-03-09 HISTORY — DX: Urinary tract infection, site not specified: N39.0

## 2016-03-09 LAB — DIFFERENTIAL
BASOS ABS: 0 10*3/uL (ref 0.0–0.1)
Basophils Relative: 0 %
Eosinophils Absolute: 0.1 10*3/uL (ref 0.0–0.7)
Eosinophils Relative: 2 %
LYMPHS ABS: 3.3 10*3/uL (ref 0.7–4.0)
LYMPHS PCT: 51 %
Monocytes Absolute: 0.3 10*3/uL (ref 0.1–1.0)
Monocytes Relative: 5 %
NEUTROS ABS: 2.7 10*3/uL (ref 1.7–7.7)
NEUTROS PCT: 42 %

## 2016-03-09 LAB — I-STAT CHEM 8, ED
BUN: 9 mg/dL (ref 6–20)
CALCIUM ION: 1.13 mmol/L (ref 1.12–1.23)
CHLORIDE: 106 mmol/L (ref 101–111)
CREATININE: 0.8 mg/dL (ref 0.44–1.00)
Glucose, Bld: 106 mg/dL — ABNORMAL HIGH (ref 65–99)
HCT: 48 % — ABNORMAL HIGH (ref 36.0–46.0)
Hemoglobin: 16.3 g/dL — ABNORMAL HIGH (ref 12.0–15.0)
Potassium: 4.2 mmol/L (ref 3.5–5.1)
SODIUM: 142 mmol/L (ref 135–145)
TCO2: 22 mmol/L (ref 0–100)

## 2016-03-09 LAB — CBC
HCT: 45.9 % (ref 36.0–46.0)
HEMOGLOBIN: 15.2 g/dL — AB (ref 12.0–15.0)
MCH: 30.5 pg (ref 26.0–34.0)
MCHC: 33.1 g/dL (ref 30.0–36.0)
MCV: 92 fL (ref 78.0–100.0)
PLATELETS: 182 10*3/uL (ref 150–400)
RBC: 4.99 MIL/uL (ref 3.87–5.11)
RDW: 14.7 % (ref 11.5–15.5)
WBC: 6.4 10*3/uL (ref 4.0–10.5)

## 2016-03-09 LAB — URINE MICROSCOPIC-ADD ON

## 2016-03-09 LAB — COMPREHENSIVE METABOLIC PANEL
ALBUMIN: 3.5 g/dL (ref 3.5–5.0)
ALK PHOS: 85 U/L (ref 38–126)
ALT: 36 U/L (ref 14–54)
ANION GAP: 9 (ref 5–15)
AST: 50 U/L — AB (ref 15–41)
BILIRUBIN TOTAL: 0.6 mg/dL (ref 0.3–1.2)
BUN: 8 mg/dL (ref 6–20)
CALCIUM: 9.2 mg/dL (ref 8.9–10.3)
CO2: 21 mmol/L — AB (ref 22–32)
CREATININE: 0.85 mg/dL (ref 0.44–1.00)
Chloride: 110 mmol/L (ref 101–111)
GFR calc Af Amer: >60 mL/min (ref >60)
GFR calc non Af Amer: >60 mL/min (ref >60)
GLUCOSE: 112 mg/dL — AB (ref 65–99)
Potassium: 4.3 mmol/L (ref 3.5–5.1)
SODIUM: 140 mmol/L (ref 135–145)
TOTAL PROTEIN: 6.9 g/dL (ref 6.5–8.1)

## 2016-03-09 LAB — URINALYSIS, ROUTINE W REFLEX MICROSCOPIC
GLUCOSE, UA: NEGATIVE mg/dL
Ketones, ur: 15 mg/dL — AB
NITRITE: POSITIVE — AB
PH: 7.5 (ref 5.0–8.0)
Protein, ur: 100 mg/dL — AB
SPECIFIC GRAVITY, URINE: 1.018 (ref 1.005–1.030)

## 2016-03-09 LAB — I-STAT TROPONIN, ED: Troponin i, poc: 0 ng/mL (ref 0.00–0.08)

## 2016-03-09 LAB — RAPID URINE DRUG SCREEN, HOSP PERFORMED
Amphetamines: NOT DETECTED
BARBITURATES: NOT DETECTED
BENZODIAZEPINES: NOT DETECTED
COCAINE: NOT DETECTED
Opiates: NOT DETECTED
TETRAHYDROCANNABINOL: POSITIVE — AB

## 2016-03-09 LAB — PROTIME-INR
INR: 1.07 (ref 0.00–1.49)
PROTHROMBIN TIME: 14.1 s (ref 11.6–15.2)

## 2016-03-09 LAB — PREGNANCY, URINE: PREG TEST UR: NEGATIVE

## 2016-03-09 LAB — ETHANOL: Alcohol, Ethyl (B): <5 mg/dL (ref <5)

## 2016-03-09 LAB — APTT: aPTT: 36 seconds (ref 24–37)

## 2016-03-09 MED ORDER — ENOXAPARIN SODIUM 40 MG/0.4ML ~~LOC~~ SOLN
40.0000 mg | SUBCUTANEOUS | Status: DC
  Administered 2016-03-10: 40 mg via SUBCUTANEOUS
  Filled 2016-03-09: qty 0.4

## 2016-03-09 MED ORDER — DEXTROSE 5 % IV SOLN
1.0000 g | INTRAVENOUS | Status: DC
  Administered 2016-03-09: 1 g via INTRAVENOUS
  Filled 2016-03-09 (×2): qty 10

## 2016-03-09 MED ORDER — SODIUM CHLORIDE 0.9 % IV SOLN: INTRAVENOUS | Status: DC

## 2016-03-09 MED ORDER — NICOTINE 21 MG/24HR TD PT24
21.0000 mg | MEDICATED_PATCH | Freq: Every day | TRANSDERMAL | Status: DC
  Filled 2016-03-09: qty 1

## 2016-03-09 MED ORDER — ASPIRIN 325 MG PO TABS
325.0000 mg | ORAL_TABLET | Freq: Every day | ORAL | Status: DC
  Administered 2016-03-09 – 2016-03-10 (×2): 325 mg via ORAL
  Filled 2016-03-09 (×2): qty 1

## 2016-03-09 MED ORDER — ASPIRIN 81 MG PO CHEW: 81.0000 mg | CHEWABLE_TABLET | Freq: Once | ORAL | Status: DC

## 2016-03-09 MED ORDER — LORAZEPAM 2 MG/ML IJ SOLN
1.0000 mg | Freq: Once | INTRAMUSCULAR | Status: AC
  Administered 2016-03-09: 1 mg via INTRAVENOUS
  Filled 2016-03-09: qty 1

## 2016-03-09 MED ORDER — SENNOSIDES-DOCUSATE SODIUM 8.6-50 MG PO TABS: 1.0000 | ORAL_TABLET | Freq: Every evening | ORAL | Status: DC | PRN

## 2016-03-09 MED ORDER — ASPIRIN 300 MG RE SUPP
300.0000 mg | Freq: Every day | RECTAL | Status: DC
  Filled 2016-03-09: qty 1

## 2016-03-09 MED ORDER — DEXTROSE 5 % IV SOLN
1.0000 g | Freq: Once | INTRAVENOUS | Status: AC
  Administered 2016-03-09: 1 g via INTRAVENOUS
  Filled 2016-03-09: qty 10

## 2016-03-09 MED ORDER — STROKE: EARLY STAGES OF RECOVERY BOOK
Freq: Once | Status: DC
  Filled 2016-03-09: qty 1

## 2016-03-09 NOTE — ED Notes (Signed)
Pt to MRI

## 2016-03-09 NOTE — Consult Note (Signed)
Admission H&P    Chief Complaint: Transient right side weakness and slurred speech.  HPI: Erika Wong is an 50 y.o. female with a history of asthma presenting following onset of right arm and leg weakness as well as slurred speech. He was last known well at 4 AM today. When she woke up again at 10 AM right-sided weakness was present. Symptoms have subsequently resolved. She has no previous history of stroke nor TIA. She has not been on antiplatelet therapy. He scan of the head showed no acute intracranial abnormality. NIH stroke score at the time of this evaluation was 0.  LSN: 4 AM on 03/09/2016 tPA Given: No: Deficits resolved mRankin:  Past Medical History  Diagnosis Date  . Asthma     Past Surgical History  Procedure Laterality Date  . Tubal ligation      History reviewed. No pertinent family history. Social History:  reports that she has been smoking Cigarettes.  She has been smoking about 1.00 pack per day. She does not have any smokeless tobacco history on file. She reports that she drinks alcohol. She reports that she does not use illicit drugs.  Allergies:  Allergies  Allergen Reactions  . Hydrocodone Other (See Comments)    "Makes me feel funny"    Medications: Patient's preadmission medications were reviewed by me.  ROS: History obtained from the patient  General ROS: negative for - chills, fatigue, fever, night sweats, weight gain or weight loss Psychological ROS: negative for - behavioral disorder, hallucinations, memory difficulties, mood swings or suicidal ideation Ophthalmic ROS: negative for - blurry vision, double vision, eye pain or loss of vision ENT ROS: negative for - epistaxis, nasal discharge, oral lesions, sore throat, tinnitus or vertigo Allergy and Immunology ROS: negative for - hives or itchy/watery eyes Hematological and Lymphatic ROS: negative for - bleeding problems, bruising or swollen lymph nodes Endocrine ROS: negative for - galactorrhea,  hair pattern changes, polydipsia/polyuria or temperature intolerance Respiratory ROS: negative for - cough, hemoptysis, shortness of breath or wheezing Cardiovascular ROS: negative for - chest pain, dyspnea on exertion, edema or irregular heartbeat Gastrointestinal ROS: negative for - abdominal pain, diarrhea, hematemesis, nausea/vomiting or stool incontinence Genito-Urinary ROS: negative for - dysuria, hematuria, incontinence or urinary frequency/urgency Musculoskeletal ROS: negative for - joint swelling or muscular weakness Neurological ROS: as noted in HPI Dermatological ROS: negative for rash and skin lesion changes  Physical Examination: Blood pressure 117/84, pulse 77, temperature 98.1 F (36.7 C), temperature source Oral, resp. rate 26, weight 113.853 kg (251 lb), last menstrual period 03/09/2016, SpO2 98 %.  HEENT-  Normocephalic, no lesions, without obvious abnormality.  Normal external eye and conjunctiva.  Normal TM's bilaterally.  Normal auditory canals and external ears. Normal external nose, mucus membranes and septum.  Normal pharynx. Neck supple with no masses, nodes, nodules or enlargement. Cardiovascular - regular rate and rhythm, S1, S2 normal, no murmur, click, rub or gallop Lungs - chest clear, no wheezing, rales, normal symmetric air entry Abdomen - soft, non-tender; bowel sounds normal; no masses,  no organomegaly Extremities - no joint deformities, effusion, or inflammation and no edema  Neurologic Examination: Mental Status: Alert, oriented, thought content appropriate.  Speech fluent without evidence of aphasia. Able to follow commands without difficulty. Cranial Nerves: II-Visual fields were normal. III/IV/VI-Pupils were equal and reacted normally to light. Extraocular movements were full and conjugate.    V/VII-no facial numbness and no facial weakness. VIII-normal. X-normal speech and symmetrical palatal movement. XI: trapezius strength/neck flexion strength  normal bilaterally XII-midline tongue extension with normal strength. Motor: 5/5 bilaterally with normal tone and bulk Sensory: Normal throughout. Deep Tendon Reflexes: Race to 1+ and symmetric. Plantars: Flexor bilaterally Cerebellar: Normal finger-to-nose testing. Carotid auscultation: Normal  Results for orders placed or performed during the hospital encounter of 03/09/16 (from the past 48 hour(s))  Protime-INR     Status: None   Collection Time: 03/09/16  1:30 PM  Result Value Ref Range   Prothrombin Time 14.1 11.6 - 15.2 seconds   INR 1.07 0.00 - 1.49  APTT     Status: None   Collection Time: 03/09/16  1:30 PM  Result Value Ref Range   aPTT 36 24 - 37 seconds  CBC     Status: Abnormal   Collection Time: 03/09/16  1:30 PM  Result Value Ref Range   WBC 6.4 4.0 - 10.5 K/uL   RBC 4.99 3.87 - 5.11 MIL/uL   Hemoglobin 15.2 (H) 12.0 - 15.0 g/dL   HCT 50.6 08.9 - 87.6 %   MCV 92.0 78.0 - 100.0 fL   MCH 30.5 26.0 - 34.0 pg   MCHC 33.1 30.0 - 36.0 g/dL   RDW 18.4 27.6 - 48.7 %   Platelets 182 150 - 400 K/uL  Differential     Status: None   Collection Time: 03/09/16  1:30 PM  Result Value Ref Range   Neutrophils Relative % 42 %   Neutro Abs 2.7 1.7 - 7.7 K/uL   Lymphocytes Relative 51 %   Lymphs Abs 3.3 0.7 - 4.0 K/uL   Monocytes Relative 5 %   Monocytes Absolute 0.3 0.1 - 1.0 K/uL   Eosinophils Relative 2 %   Eosinophils Absolute 0.1 0.0 - 0.7 K/uL   Basophils Relative 0 %   Basophils Absolute 0.0 0.0 - 0.1 K/uL  Comprehensive metabolic panel     Status: Abnormal   Collection Time: 03/09/16  1:30 PM  Result Value Ref Range   Sodium 140 135 - 145 mmol/L   Potassium 4.3 3.5 - 5.1 mmol/L   Chloride 110 101 - 111 mmol/L   CO2 21 (L) 22 - 32 mmol/L   Glucose, Bld 112 (H) 65 - 99 mg/dL   BUN 8 6 - 20 mg/dL   Creatinine, Ser 6.55 0.44 - 1.00 mg/dL   Calcium 9.2 8.9 - 14.9 mg/dL   Total Protein 6.9 6.5 - 8.1 g/dL   Albumin 3.5 3.5 - 5.0 g/dL   AST 50 (H) 15 - 41 U/L   ALT  36 14 - 54 U/L   Alkaline Phosphatase 85 38 - 126 U/L   Total Bilirubin 0.6 0.3 - 1.2 mg/dL   GFR calc non Af Amer >60 >60 mL/min   GFR calc Af Amer >60 >60 mL/min    Comment: (NOTE) The eGFR has been calculated using the CKD EPI equation. This calculation has not been validated in all clinical situations. eGFR's persistently <60 mL/min signify possible Chronic Kidney Disease.    Anion gap 9 5 - 15  I-stat troponin, ED (not at [facility])     Status: None   Collection Time: 03/09/16  1:38 PM  Result Value Ref Range   Troponin i, poc 0.00 0.00 - 0.08 ng/mL   Comment 3            Comment: Due to the release kinetics of cTnI, a negative result within the first hours of the onset of symptoms does not rule out myocardial infarction with certainty. If myocardial  infarction is still suspected, repeat the test at appropriate intervals.   I-Stat Chem 8, ED  (not at [facility])     Status: Abnormal   Collection Time: 03/09/16  1:39 PM  Result Value Ref Range   Sodium 142 135 - 145 mmol/L   Potassium 4.2 3.5 - 5.1 mmol/L   Chloride 106 101 - 111 mmol/L   BUN 9 6 - 20 mg/dL   Creatinine, Ser 0.80 0.44 - 1.00 mg/dL   Glucose, Bld 106 (H) 65 - 99 mg/dL   Calcium, Ion 1.13 1.12 - 1.23 mmol/L   TCO2 22 0 - 100 mmol/L   Hemoglobin 16.3 (H) 12.0 - 15.0 g/dL   HCT 48.0 (H) 36.0 - 46.0 %  Urinalysis, Routine w reflex microscopic (not at [facility])     Status: Abnormal   Collection Time: 03/09/16  4:05 PM  Result Value Ref Range   Color, Urine RED (A) YELLOW    Comment: BIOCHEMICALS MAY BE AFFECTED BY COLOR   APPearance TURBID (A) CLEAR   Specific Gravity, Urine 1.018 1.005 - 1.030   pH 7.5 5.0 - 8.0   Glucose, UA NEGATIVE NEGATIVE mg/dL   Hgb urine dipstick LARGE (A) NEGATIVE   Bilirubin Urine LARGE (A) NEGATIVE   Ketones, ur 15 (A) NEGATIVE mg/dL   Protein, ur 100 (A) NEGATIVE mg/dL   Nitrite POSITIVE (A) NEGATIVE   Leukocytes, UA MODERATE (A) NEGATIVE  Urine rapid drug screen (hosp  performed)not at [facility]     Status: Abnormal   Collection Time: 03/09/16  4:05 PM  Result Value Ref Range   Opiates NONE DETECTED NONE DETECTED   Cocaine NONE DETECTED NONE DETECTED   Benzodiazepines NONE DETECTED NONE DETECTED   Amphetamines NONE DETECTED NONE DETECTED   Tetrahydrocannabinol POSITIVE (A) NONE DETECTED   Barbiturates NONE DETECTED NONE DETECTED    Comment:        DRUG SCREEN FOR MEDICAL PURPOSES ONLY.  IF CONFIRMATION IS NEEDED FOR ANY PURPOSE, NOTIFY LAB WITHIN 5 DAYS.        LOWEST DETECTABLE LIMITS FOR URINE DRUG SCREEN Drug Class       Cutoff (ng/mL) Amphetamine      1000 Barbiturate      200 Benzodiazepine   213 Tricyclics       086 Opiates          300 Cocaine          300 THC              50   Pregnancy, urine     Status: None   Collection Time: 03/09/16  4:05 PM  Result Value Ref Range   Preg Test, Ur NEGATIVE NEGATIVE    Comment:        THE SENSITIVITY OF THIS METHODOLOGY IS >20 mIU/mL.   Urine microscopic-add on     Status: Abnormal   Collection Time: 03/09/16  4:05 PM  Result Value Ref Range   Squamous Epithelial / LPF 0-5 (A) NONE SEEN   WBC, UA TOO NUMEROUS TO COUNT 0 - 5 WBC/hpf   RBC / HPF TOO NUMEROUS TO COUNT 0 - 5 RBC/hpf   Bacteria, UA MANY (A) NONE SEEN  Ethanol     Status: None   Collection Time: 03/09/16  4:18 PM  Result Value Ref Range   Alcohol, Ethyl (B) <5 <5 mg/dL    Comment:        LOWEST DETECTABLE LIMIT FOR SERUM ALCOHOL IS 5 mg/dL FOR MEDICAL PURPOSES  ONLY    Ct Head Wo Contrast  03/09/2016  CLINICAL DATA:  Woke up this morning with right side weakness EXAM: CT HEAD WITHOUT CONTRAST TECHNIQUE: Contiguous axial images were obtained from the base of the skull through the vertex without intravenous contrast. COMPARISON:  None. FINDINGS: Brain: No intracranial hemorrhage, mass effect or midline shift. No intra or extra-axial fluid collection. No definite acute cortical infarction. No mass lesion is noted on this unenhanced  scan. No hydrocephalus. Vascular: No hyperdense vessel or unexpected calcification. Skull: Negative for fracture or focal lesion. Sinuses/Orbits: No acute findings. Other: None. IMPRESSION: No intracranial hemorrhage, mass effect or midline shift. No definite acute cortical infarction. No hydrocephalus. Electronically Signed   By: Lahoma Crocker M.D.   On: 03/09/2016 14:14    Assessment: 50 y.o. female with no known risk factors for stroke other than family history, presenting with probable left cerebral subcortical TIA. Acute small vessel ischemic infarction cannot be ruled out at this point however.  Stroke Risk Factors - family history  Plan: 1. HgbA1c, fasting lipid panel 2. MRI, MRA  of the brain without contrast 3. PT consult, OT consult, Speech consult 4. Echocardiogram 5. Carotid dopplers 6. Prophylactic therapy-Antiplatelet med: Aspirin 7. Risk factor modification 8. Telemetry monitoring  C.R. Nicole Kindred, MD [facility] [phone]  03/09/2016, 8:29 PM

## 2016-03-09 NOTE — ED Provider Notes (Signed)
CSN: [id number]     Arrival date & time 03/09/16  1306 History   First MD Initiated Contact with Patient 03/09/16 1551     Chief Complaint  Patient presents with  . Extremity Weakness     (Consider location/radiation/quality/duration/timing/severity/associated sxs/prior Treatment) Patient is a 50 y.o. female presenting with neurologic complaint. The history is provided by the patient and a relative (brother).  Neurologic Problem This is a new (Right arm and leg weakness. Trouble with gait due to weakness) problem. Episode onset: Last known normal 4am this morning. Noticed problem when she woke up at 10am. The problem occurs constantly. The problem has been gradually improving. Associated symptoms include nausea and weakness. Pertinent negatives include no abdominal pain, arthralgias, chest pain, chills, coughing, diaphoresis, fatigue, fever, myalgias, rash, sore throat (signifigantly improved from 2 weeks ago), vertigo, visual change or vomiting. Associated symptoms comments: tinnitus . Nothing aggravates the symptoms. She has tried nothing for the symptoms.    Past Medical History  Diagnosis Date  . Asthma   . Tobacco abuse    Past Surgical History  Procedure Laterality Date  . Tubal ligation     History reviewed. No pertinent family history. Social History  Substance Use Topics  . Smoking status: Current Every Day Smoker -- 1.00 packs/day    Types: Cigarettes  . Smokeless tobacco: None  . Alcohol Use: Yes     Comment: occ   OB History    No data available     Review of Systems  Constitutional: Negative for fever, chills, diaphoresis, activity change, appetite change and fatigue.  HENT: Positive for tinnitus. Negative for facial swelling, rhinorrhea, sore throat (signifigantly improved from 2 weeks ago), trouble swallowing and voice change.   Eyes: Negative for photophobia, pain and visual disturbance.  Respiratory: Negative for cough, shortness of breath, wheezing and  stridor.   Cardiovascular: Negative for chest pain, palpitations and leg swelling.  Gastrointestinal: Positive for nausea. Negative for vomiting, abdominal pain, constipation and anal bleeding.  Endocrine: Negative.   Genitourinary: Negative for dysuria, vaginal bleeding, vaginal discharge and vaginal pain.  Musculoskeletal: Positive for gait problem. Negative for myalgias, back pain and arthralgias.  Skin: Negative.  Negative for rash.  Allergic/Immunologic: Negative.   Neurological: Positive for weakness. Negative for dizziness, vertigo, tremors and syncope.  Psychiatric/Behavioral: Negative for suicidal ideas, sleep disturbance and self-injury.  All other systems reviewed and are negative.     Allergies  Hydrocodone  Home Medications   Prior to Admission medications   Medication Sig Start Date End Date Taking? Authorizing Provider  benzonatate (TESSALON) 100 MG capsule Take 1 capsule (100 mg total) by mouth every 8 (eight) hours. Patient not taking: Reported on 07/05/2015 02/02/14   Charlestine Night, PA-C  Guaifenesin 1200 MG TB12 Take 1 tablet (1,200 mg total) by mouth 2 (two) times daily. Patient not taking: Reported on 07/05/2015 02/02/14   Charlestine Night, PA-C  naproxen (NAPROSYN) 500 MG tablet Take 1 tablet (500 mg total) by mouth 2 (two) times daily. Patient not taking: Reported on 03/09/2016 12/14/14   Jaynie Crumble, PA-C  promethazine-dextromethorphan (PROMETHAZINE-DM) 6.25-15 MG/5ML syrup Take 5 mLs by mouth 4 (four) times daily as needed for cough. Patient not taking: Reported on 07/05/2015 02/02/14   Charlestine Night, PA-C   BP 117/84 mmHg  Pulse 77  Temp(Src) 98.1 F (36.7 C) (Oral)  Resp 26  Wt 113.853 kg  SpO2 98%  LMP 03/09/2016 Physical Exam  Constitutional: She is oriented to person, place, and time. She  appears well-developed and well-nourished. No distress.  HENT:  Head: Normocephalic and atraumatic.  Right Ear: External ear normal.  Left Ear:  External ear normal.  Mouth/Throat: No oropharyngeal exudate.  Mild erythema to posterior pharynx with no noticeable exudate. Uvula midline. Patent airway. No abscess.   Eyes: Conjunctivae and EOM are normal. Pupils are equal, round, and reactive to light. No scleral icterus.  Neck: Normal range of motion. Neck supple. No JVD present. No tracheal deviation present. No thyromegaly present.  Cardiovascular: Normal rate, regular rhythm and intact distal pulses.   Pulmonary/Chest: Effort normal and breath sounds normal. No respiratory distress. She has no wheezes. She has no rales.  Abdominal: Soft. Bowel sounds are normal. She exhibits no distension. There is no tenderness.  Musculoskeletal: Normal range of motion. She exhibits no edema or tenderness.  Neurological: She is alert and oriented to person, place, and time. No cranial nerve deficit. She exhibits normal muscle tone. Coordination normal.  GCS 15. 5/5 strength in all 4 extremities. Sensation intact and normal in all 4 extremities. Slow deliberate gate but able to ambulate in straight line with drift. Normal finger to nose and heel to shin. Negative romberg.    Skin: Skin is warm and dry. She is not diaphoretic. No pallor.  Psychiatric: She has a normal mood and affect. She expresses no homicidal and no suicidal ideation. She expresses no suicidal plans and no homicidal plans.  Nursing note and vitals reviewed.   ED Course  Procedures (including critical care time) Labs Review Labs Reviewed  CBC - Abnormal; Notable for the following:    Hemoglobin 15.2 (*)    All other components within normal limits  COMPREHENSIVE METABOLIC PANEL - Abnormal; Notable for the following:    CO2 21 (*)    Glucose, Bld 112 (*)    AST 50 (*)    All other components within normal limits  URINALYSIS, ROUTINE W REFLEX MICROSCOPIC (NOT AT [facility]) - Abnormal; Notable for the following:    Color, Urine RED (*)    APPearance TURBID (*)    Hgb urine dipstick  LARGE (*)    Bilirubin Urine LARGE (*)    Ketones, ur 15 (*)    Protein, ur 100 (*)    Nitrite POSITIVE (*)    Leukocytes, UA MODERATE (*)    All other components within normal limits  URINE RAPID DRUG SCREEN, HOSP PERFORMED - Abnormal; Notable for the following:    Tetrahydrocannabinol POSITIVE (*)    All other components within normal limits  URINE MICROSCOPIC-ADD ON - Abnormal; Notable for the following:    Squamous Epithelial / LPF 0-5 (*)    Bacteria, UA MANY (*)    All other components within normal limits  I-STAT CHEM 8, ED - Abnormal; Notable for the following:    Glucose, Bld 106 (*)    Hemoglobin 16.3 (*)    HCT 48.0 (*)    All other components within normal limits  URINE CULTURE  PROTIME-INR  APTT  DIFFERENTIAL  ETHANOL  PREGNANCY, URINE  HEMOGLOBIN A1C  I-STAT TROPOININ, ED    Imaging Review Ct Head Wo Contrast  03/09/2016  CLINICAL DATA:  Woke up this morning with right side weakness EXAM: CT HEAD WITHOUT CONTRAST TECHNIQUE: Contiguous axial images were obtained from the base of the skull through the vertex without intravenous contrast. COMPARISON:  None. FINDINGS: Brain: No intracranial hemorrhage, mass effect or midline shift. No intra or extra-axial fluid collection. No definite acute cortical infarction. No mass  lesion is noted on this unenhanced scan. No hydrocephalus. Vascular: No hyperdense vessel or unexpected calcification. Skull: Negative for fracture or focal lesion. Sinuses/Orbits: No acute findings. Other: None. IMPRESSION: No intracranial hemorrhage, mass effect or midline shift. No definite acute cortical infarction. No hydrocephalus. Electronically Signed   By: Natasha MeadLiviu  Pop M.D.   On: 03/09/2016 14:14   I have personally reviewed and evaluated these images and lab results as part of my medical decision-making.   EKG Interpretation   Date/Time:  Thursday March 09 2016 13:27:50 EDT Ventricular Rate:  75 PR Interval:  174 QRS Duration: 76 QT Interval:   396 QTC Calculation: 442 R Axis:   44 Text Interpretation:  Normal sinus rhythm Non-specific ST-t changes No  previous tracing Confirmed by Denton LankSTEINL  MD, Caryn BeeKEVIN ([id number]) on 03/09/2016  4:13:30 PM      MDM   Final diagnoses:  Transient cerebral ischemia, unspecified transient cerebral ischemia type    The patient is a 50 year old female with a past medical history significant for tobacco abuse and recent pharyngitis. She presents for right sided weakness that she noticed when she woke up at 10 AM this morning. Asked known normal 4 AM. Reports it was difficult to walk due to sig significant weakness. Tinnitus also present for same time frame. This has been significantly improving throughout the day. Neurologic exam shows slow deliberate gate but otherwise unremarkable. Head CT unremarkable in the ED. Neurology's consultation evaluates the patient in the ED. Please see their note for more detail. They recommend observation admission for TIA workup. Patient verses understanding and agreement with this plan. The patient is noted to have urine indicative of urinary tract infection. Culture is sent and she is treated with ceftriaxone. Patient admitted to hospitalist service for further management.  Patient seen with attending, Dr. Denton LankSteinl, who oversaw clinical decision making.   Lula OlszewskiMike Panfilo Ketchum, MD 03/09/16 [phone]  Cathren LaineKevin Steinl, MD 03/09/16 2130

## 2016-03-09 NOTE — ED Notes (Signed)
Neurology at bedside.

## 2016-03-09 NOTE — ED Notes (Signed)
Pt reports right sided weakness this morning when she woke up. States that she had trouble walking but is now feeling alittle better. Also reports a ringing sound in her ears.

## 2016-03-09 NOTE — H&P (Signed)
[facility] History and Physical  Erika Wong RJS:[id number] DOB: Oct 28, 1966 DOA: 03/09/2016  Referring physician: ED physician PCP: No primary care provider on file.  Specialists:   Chief Complaint: Right-sided weakness, right ear ringing, slurred speech  HPI: Erika Wong is a 50 y.o. female with PMH of asthma, tobacco abuse, who presents with right-sided weakness, right urinate and slurred speech  Patient reports that she had one episode of right-sided weakness, right ear ringing and slurred speech and this morning. The symptoms lasted for about a few hours, then resolved spontaneously. She did not have vision change or hearing loss. Patient denies chest pain, shortness of breath, cough, nausea, vomiting, diarrhea, abdominal pain. She has increased urinary frequency recently, but no dysuria or burning on urination.  In ED, patient was found to have positive urinalysis with moderate amount of leukocytes and positive nitrite, INR 1.07, negative troponin, positive UDS for T HC, negative pregnancy test, WBC 6.4, temperature normal, no tachycardia, electrolytes and renal function okay. CT head is negative for acute intracranial abnormalities. Patient is admitted to inpatient for further eventration treatment. Neurology was consulted.  EKG: Independently reviewed. QTC 442, nonspecific T-wave change  Where does patient live?   At home  Can patient participate in ADLs?  Yes     Review of Systems:   General: no fevers, chills, no changes in body weight, has fatigue HEENT: no blurry vision, hearing changes or sore throat Pulm: no dyspnea, coughing, wheezing CV: no chest pain, no palpitations Abd: no nausea, vomiting, abdominal pain, diarrhea, constipation GU: no dysuria, burning on urination, has increased urinary frequency, no hematuria  Ext: no leg edema Neuro: had R sided weakness and slurred speech. No vision change or hearing loss. Had R ear ringing. Skin: no rash MSK: No muscle  spasm, no deformity, no limitation of range of movement in spin Heme: No easy bruising.  Travel history: No recent long distant travel.  Allergy:  Allergies  Allergen Reactions  . Hydrocodone Other (See Comments)    "Makes me feel funny"    Past Medical History  Diagnosis Date  . Asthma   . Tobacco abuse     Past Surgical History  Procedure Laterality Date  . Tubal ligation      Social History:  reports that she has been smoking Cigarettes.  She has been smoking about 1.00 pack per day. She does not have any smokeless tobacco history on file. She reports that she drinks alcohol. She reports that she does not use illicit drugs.  Family History:  Family History  Problem Relation Age of Onset  . Heart disease Mother   . Diabetes Mother   . Asthma Father   . Asthma Sister   . Asthma Brother   . COPD Father   . Diabetes Sister      Prior to Admission medications   Medication Sig Start Date End Date Taking? Authorizing Provider  benzonatate (TESSALON) 100 MG capsule Take 1 capsule (100 mg total) by mouth every 8 (eight) hours. Patient not taking: Reported on 07/05/2015 02/02/14   Charlestine Night, PA-C  Guaifenesin 1200 MG TB12 Take 1 tablet (1,200 mg total) by mouth 2 (two) times daily. Patient not taking: Reported on 07/05/2015 02/02/14   Charlestine Night, PA-C  naproxen (NAPROSYN) 500 MG tablet Take 1 tablet (500 mg total) by mouth 2 (two) times daily. Patient not taking: Reported on 03/09/2016 12/14/14   Jaynie Crumble, PA-C  promethazine-dextromethorphan (PROMETHAZINE-DM) 6.25-15 MG/5ML syrup Take 5 mLs by mouth 4 (four)  times daily as needed for cough. Patient not taking: Reported on 07/05/2015 02/02/14   Dalia Heading, PA-C    Physical Exam: Filed Vitals:   03/09/16 1800 03/09/16 1830 03/09/16 1900 03/09/16 1930  BP: 117/73 115/73 128/73 117/84  Pulse: 62 64 77 77  Temp:      TempSrc:      Resp: '21 22 25 26  '$ Weight:      SpO2: 99% 98% 98% 98%    General: Not in acute distress HEENT:       Eyes: PERRL, EOMI, no scleral icterus.       ENT: No discharge from the ears and nose, no pharynx injection, no tonsillar enlargement.        Neck: No JVD, no bruit, no mass felt. Heme: No neck lymph node enlargement. Cardiac: S1/S2, RRR, No murmurs, No gallops or rubs. Pulm:  No rales, wheezing, rhonchi or rubs. Abd: Soft, nondistended, nontender, no rebound pain, no organomegaly, BS present. Ext: No pitting leg edema bilaterally. 2+DP/PT pulse bilaterally. Musculoskeletal: No joint deformities, No joint redness or warmth, no limitation of ROM in spin. Skin: No rashes.  Neuro: Alert, oriented X3, cranial nerves II-XII grossly intact, moves all extremities normally. Muscle strength 5/5 in all extremities, sensation to light touch intact. Brachial reflex 2+ bilaterally. Knee reflex 1+ bilaterally. Negative Babinski's sign. Normal finger to nose test. Psych: Patient is not psychotic, no suicidal or hemocidal ideation.  Labs on Admission:  Basic Metabolic Panel:  Recent Labs Lab 03/09/16 1330 03/09/16 1339  NA 140 142  K 4.3 4.2  CL 110 106  CO2 21*  --   GLUCOSE 112* 106*  BUN 8 9  CREATININE 0.85 0.80  CALCIUM 9.2  --    Liver Function Tests:  Recent Labs Lab 03/09/16 1330  AST 50*  ALT 36  ALKPHOS 85  BILITOT 0.6  PROT 6.9  ALBUMIN 3.5   No results for input(s): LIPASE, AMYLASE in the last 168 hours. No results for input(s): AMMONIA in the last 168 hours. CBC:  Recent Labs Lab 03/09/16 1330 03/09/16 1339  WBC 6.4  --   NEUTROABS 2.7  --   HGB 15.2* 16.3*  HCT 45.9 48.0*  MCV 92.0  --   PLT 182  --    Cardiac Enzymes: No results for input(s): CKTOTAL, CKMB, CKMBINDEX, TROPONINI in the last 168 hours.  BNP (last 3 results) No results for input(s): BNP in the last 8760 hours.  ProBNP (last 3 results) No results for input(s): PROBNP in the last 8760 hours.  CBG: No results for input(s): GLUCAP in the last  168 hours.  Radiological Exams on Admission: Ct Head Wo Contrast  03/09/2016  CLINICAL DATA:  Woke up this morning with right side weakness EXAM: CT HEAD WITHOUT CONTRAST TECHNIQUE: Contiguous axial images were obtained from the base of the skull through the vertex without intravenous contrast. COMPARISON:  None. FINDINGS: Brain: No intracranial hemorrhage, mass effect or midline shift. No intra or extra-axial fluid collection. No definite acute cortical infarction. No mass lesion is noted on this unenhanced scan. No hydrocephalus. Vascular: No hyperdense vessel or unexpected calcification. Skull: Negative for fracture or focal lesion. Sinuses/Orbits: No acute findings. Other: None. IMPRESSION: No intracranial hemorrhage, mass effect or midline shift. No definite acute cortical infarction. No hydrocephalus. Electronically Signed   By: Lahoma Crocker M.D.   On: 03/09/2016 14:14    Assessment/Plan Principal Problem:   TIA (transient ischemic attack) Active Problems:   Tobacco abuse  Asthma   UTI (lower urinary tract infection)  TIA (transient ischemic attack): Patient's symptoms are consistent with TIA. CT head is negative for acute intracranial abnormalities. Neurology, Dr. Nicole Kindred were consulted, recommended TIA workup. Per Dr. Nicole Kindred, acute small vessel ischemic infarction cannot be ruled out at this point however.  - will admit to tele bed for observation - Appreciate Dr. Nicole Kindred consultation, the follow-up recommendations - Risk factor modification: HgbA1c, fasting lipid panel  - MRI, MRA of the brain without contrast  - PT consult, OT consult, Speech consult  - 2 d Echocardiogram  - Ekg  - Carotid dopplers  - Aspirin  - given her young age, will also check CRP, ESR, HIV antibody, ANA, hypercoagulable panel  Substance abuse: Patient has positive UDS for THC. She is a smoker. -Did counseling about importance of quitting smoking and Substance use -Nicotine patch  Asthma: stable. Not  using inhaler at home. Lung is clear to auscultation. -Observe closely  UTI: Patient has increased urinary frequency and positive urinalysis. She is not septic on admission. -IV Rocephin -Follow-up urine culture   DVT ppx: SQ Lovenox  Code Status: Full code Family Communication: None at bed side.  Disposition Plan: Admit to inpatient   Date of Service 03/09/2016    Ivor Costa [facility] Pager [phone]  If 7PM-7AM, please contact night-coverage [email or web address] Password [facility] 03/09/2016, 9:28 PM

## 2016-03-10 ENCOUNTER — Observation Stay (HOSPITAL_COMMUNITY): Payer: Self-pay

## 2016-03-10 DIAGNOSIS — I671 Cerebral aneurysm, nonruptured: Secondary | ICD-10-CM | POA: Diagnosis present

## 2016-03-10 DIAGNOSIS — I639 Cerebral infarction, unspecified: Secondary | ICD-10-CM

## 2016-03-10 DIAGNOSIS — G459 Transient cerebral ischemic attack, unspecified: Secondary | ICD-10-CM

## 2016-03-10 DIAGNOSIS — E669 Obesity, unspecified: Secondary | ICD-10-CM

## 2016-03-10 HISTORY — DX: Cerebral infarction, unspecified: I63.9

## 2016-03-10 HISTORY — DX: Cerebral aneurysm, nonruptured: I67.1

## 2016-03-10 LAB — SEDIMENTATION RATE: Sed Rate: 40 mm/hr — ABNORMAL HIGH (ref 0–22)

## 2016-03-10 LAB — LIPID PANEL
CHOL/HDL RATIO: 3.7 ratio
Cholesterol: 146 mg/dL (ref 0–200)
HDL: 39 mg/dL — AB (ref >40)
LDL CALC: 78 mg/dL (ref 0–99)
Triglycerides: 147 mg/dL (ref <150)
VLDL: 29 mg/dL (ref 0–40)

## 2016-03-10 LAB — C-REACTIVE PROTEIN: CRP: 3.2 mg/dL — ABNORMAL HIGH (ref <1.0)

## 2016-03-10 LAB — ECHOCARDIOGRAM COMPLETE
Height: 63 in
WEIGHTICAEL: 4016 [oz_av]

## 2016-03-10 LAB — GLUCOSE, CAPILLARY: Glucose-Capillary: 148 mg/dL — ABNORMAL HIGH (ref 65–99)

## 2016-03-10 LAB — ANTITHROMBIN III: AntiThromb III Func: 85 % (ref 75–120)

## 2016-03-10 MED ORDER — ASPIRIN 325 MG PO TABS: 325.0000 mg | ORAL_TABLET | Freq: Every day | ORAL | Status: DC

## 2016-03-10 MED ORDER — ATORVASTATIN CALCIUM 10 MG PO TABS: 10.0000 mg | ORAL_TABLET | Freq: Every day | ORAL | Status: DC

## 2016-03-10 MED ORDER — CIPROFLOXACIN HCL 500 MG PO TABS: 500.0000 mg | ORAL_TABLET | Freq: Two times a day (BID) | ORAL | Status: AC

## 2016-03-10 NOTE — Care Management Note (Signed)
Case Management Note  Patient Details  Name: Esaw GrandchildLeah Slaugh MRN: [id number] Date of Birth: 1966-06-23  Subjective/Objective:                    Action/Plan: Patient was admitted with CVA. Lives at home with spouse. She is currently listed as self-pay and is being followed by [facility] [employer] [facility]. CM will follow for discharge needs pending PT/OT evals and physician orders.   Expected Discharge Date:  03/12/16               Expected Discharge Plan:     In-House Referral:     Discharge planning Services     Post Acute Care Choice:    Choice offered to:     DME Arranged:    DME Agency:     HH Arranged:    HH Agency:     Status of Service:  In process, will continue to follow  Medicare Important Message Given:    Date Medicare IM Given:    Medicare IM give by:    Date Additional Medicare IM Given:    Additional Medicare Important Message give by:     If discussed at Long Length of Stay Meetings, dates discussed:    Additional Comments:  Anda KraftRobarge, [place] Deitrick C, RN 03/10/2016, 10:34 AM [phone]

## 2016-03-10 NOTE — Progress Notes (Signed)
OT Cancellation Note  Patient Details Name: Erika Wong MRN: [id number] DOB: [birth year]   Cancelled Treatment:    Reason Eval/Treat Not Completed: Other (comment) (Transport present to take pt to vascular). Will follow up for OT eval as time allows.  Gaye AlkenBailey A [name] M.S., OTR/L Pager: [phone]  03/10/2016, 10:58 AM

## 2016-03-10 NOTE — Discharge Summary (Signed)
Physician Discharge Summary  Erika Wong MVE:[id number] DOB: 03/27/1966 DOA: 03/09/2016  PCP: No primary care provider on file.  Admit date: 03/09/2016 Discharge date: 03/10/2016  Time spent: 35 minutes  Recommendations for Outpatient Follow-up:  Discharge home with outpatient follow-up with cardiology for TEE and loop recorder Follow-up with neurosurgery Dr. Kathyrn Sheriff for almost a cerebral angiogram. Patient needs to find a PCP in the community.  Discharge Diagnoses:  Principal Problem:   Acute CVA (cerebrovascular accident) ([place])   Active Problems:   Tobacco abuse   Asthma   UTI (lower urinary tract infection)   Aneurysm, cerebral, nonruptured   Obesity   Discharge Condition: Fair  Diet recommendation: Heart healthy  Filed Weights   03/09/16 1323  Weight: 113.853 kg (251 lb)    History of present illness:  Please refer to admission H&P for details, in brief, 50 year old obese female with history of ongoing tobacco use, asthma who presented with right-sided weakness and slurred speech and ringing in her ears. Symptoms lasted for a few hours and spontaneously resolved. Denied any dizziness, blurred vision, hearing loss, headache, syncope, chest pain, palpitations, shortness of breath, nausea, vomiting, abdominal pain, bowel  symptoms. According to his urinary frequency recently but no dysuria. In the ED vitals were stable. Blood work showed normal CBC and chemistry. UA was positive for UTI. Induction was positive for THC. HCT was unremarkable. Patient did not received IV TPA as symptoms had resolved. Neurology was consulted. MRI of the brain done showed an acute ischemic right cerebellar infarct with no mass effect. Also showed a faint petechial hemorrhage without hemorrhagic transformation. Also showed remote lacunar infarct within the left thalamus. 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  Hospital Course:  Right cerebellar infarcts Suspected to be embolic  with no underlying etiology. Also has faint petechial hemorrhage and old thalamic lacunar infarct. Patient not on any antithrombotic agents. Added full dose aspirin. LDL of 78 and added low-dose Lipitor. 2-D echo with normal EF and normal source of emboli. Carotid Doppler shows no significant stenosis. -Hypercoagulable workup sent and should be followed as outpatient. Mildly elevated ESR and CRP. -A1c is pending. -No further symptoms. Seen by PT and OT and follow further needs. -Patient counseled on diet and exercise to lose weight and quit smoking. Refused nicotine patch as she wants to quit on her own. -We'll discharge her on full dose aspirin and statin. -Patient does not have a PCP, have asked care management to help with [facility]. -Stroke team recommends outpatient TEE and loop recorder placement (doesn't need to stay over the weekend to have it done). Have called cardiology office and they will schedule an appointment and called the patient.  Left PCOM aneurysm Incidental finding. Seen by neurosurgery and will plan on diagnostic cerebral angiogram as outpatient. Office will call patient with an appointment.  UTI Culture sent. Will discharge on empiric five-day course of oral ciprofloxacin.  Asthma Stable.  Tobacco and marijuana use Counseled strongly on cessation. Patient refused nicotine patch and wants to quit on her own.   Procedures:  Head CT  MRI brain/MRA head  2-D echo   carotid Doppler  Consultations:  Neurology  Neurosurgery  Discharge Exam: Filed Vitals:   03/10/16 1000 03/10/16 1402  BP: 104/56 118/72  Pulse:  77  Temp: 98 F (36.7 C) 98.2 F (36.8 C)  Resp: 20 20    General: Middle aged obese female not in distress HEENT: No pallor, moist mucosa, supple  neck Chest: Clear bilaterally  CVS: Normal S1 and S2, no murmurs or gallop GI: Soft, nondistended, nontender, bowel sounds present Musculoskeletal: Warm, no  edema CNS: Alert and oriented, nonfocal   Discharge Instructions    Current Discharge Medication List    START taking these medications   Details  aspirin 325 MG tablet Take 1 tablet (325 mg total) by mouth daily. Qty: 30 tablet, Refills: 0    atorvastatin (LIPITOR) 10 MG tablet Take 1 tablet (10 mg total) by mouth daily at 6 PM. Qty: 30 tablet, Refills: 0    ciprofloxacin (CIPRO) 500 MG tablet Take 1 tablet (500 mg total) by mouth 2 (two) times daily. Qty: 10 tablet, Refills: 0      STOP taking these medications     benzonatate (TESSALON) 100 MG capsule      Guaifenesin 1200 MG TB12      naproxen (NAPROSYN) 500 MG tablet      promethazine-dextromethorphan (PROMETHAZINE-DM) 6.25-15 MG/5ML syrup        Allergies  Allergen Reactions  . Hydrocodone Other (See Comments)    "Makes me feel funny"   Follow-up Information    Follow up with NUNDKUMAR, NEELESH, C, MD In 2 weeks.   Specialty:  Neurosurgery   Why:  Brain Aneurysm   Contact information:   [address] [place] [address] [phone]       Follow up with [facility] In 2 weeks.   Specialty:  Cardiology   Why:  office will call   Contact information:   [address], [address] [place] [address] [phone]       The results of significant diagnostics from this hospitalization (including imaging, microbiology, ancillary and laboratory) are listed below for reference.    Significant Diagnostic Studies: Ct Head Wo Contrast  03/09/2016  CLINICAL DATA:  Woke up this morning with right side weakness EXAM: CT HEAD WITHOUT CONTRAST TECHNIQUE: Contiguous axial images were obtained from the base of the skull through the vertex without intravenous contrast. COMPARISON:  None. FINDINGS: Brain: No intracranial hemorrhage, mass effect or midline shift. No intra or extra-axial fluid collection. No definite acute cortical infarction. No mass lesion is  noted on this unenhanced scan. No hydrocephalus. Vascular: No hyperdense vessel or unexpected calcification. Skull: Negative for fracture or focal lesion. Sinuses/Orbits: No acute findings. Other: None. IMPRESSION: No intracranial hemorrhage, mass effect or midline shift. No definite acute cortical infarction. No hydrocephalus. Electronically Signed   By: Natasha Mead M.D.   On: 03/09/2016 14:14   Mr Brain Wo Contrast  03/10/2016  CLINICAL DATA:  Initial evaluation for episode of right-sided weakness, slurred speech. EXAM: MRI HEAD WITHOUT CONTRAST MRA HEAD WITHOUT CONTRAST TECHNIQUE: Multiplanar, multiecho pulse sequences of the brain and surrounding structures were obtained without intravenous contrast. Angiographic images of the head were obtained using MRA technique without contrast. COMPARISON:  Prior CT from earlier the same day. FINDINGS: MRI HEAD FINDINGS Cerebral volume normal for patient age. No significant white matter disease present. Remote lacunar infarct within the left thalamus. There are patchy acute ischemic nonhemorrhagic infarcts involving the superior right cerebellar hemisphere within the right superior cerebellar artery territory. Probable faint associated petechial hemorrhage on SWI sequence (series 11, image 30) without hemorrhagic transformation. No significant mass effect. No other infarct. Gray-white matter deficient in otherwise maintained. Major intracranial vascular flow voids preserved. No mass lesion, midline shift, or mass effect. No hydrocephalus. No extra-axial fluid collection. Major dural  sinuses are grossly patent. Craniocervical junction normal. Visualized upper cervical spine unremarkable. Pituitary gland within normal limits. No acute abnormality about the orbits. Paranasal ounces are clear. No mastoid effusion. Inner ear structures normal. Bone marrow signal intensity normal. No scalp soft tissue abnormality. MRA HEAD FINDINGS ANTERIOR CIRCULATION: Visualized distal  cervical segments of the internal carotid arteries are patent with antegrade flow. Petrous, cavernous, and supraclinoid segments widely patent bilaterally. Ovoid saccular aneurysm measuring 10 x 6 mm present at the takeoff of the left posterior communicating artery. A1 segments, anterior communicating artery, and anterior cerebral arteries well opacified. M1 segments patent without stenosis or occlusion. MCA bifurcations normal. Distal MCA branches well opacified and symmetric. POSTERIOR CIRCULATION: Vertebral arteries patent to the vertebrobasilar junction para right vertebral artery is diminutive. Posterior inferior cerebellar arteries patent. Basilar artery widely patent to its distal aspect. Superior cerebellar arteries patent proximally. P1 segment somewhat hypoplastic bilaterally with prominent posterior communicating arteries. PCAs well opacified to their distal aspects. IMPRESSION: MRI HEAD IMPRESSION: 1. Acute ischemic right cerebellar infarcts as above. No associated mass-effect. Associated faint petechial hemorrhage without evidence for hemorrhagic transformation. 2. Remote lacunar infarct within the left thalamus. MRA HEAD IMPRESSION: 1. No large or proximal arterial branch occlusion within the intracranial circulation. No high-grade or correctable stenosis. 2. 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 Neurovascular consultation suggested. Electronically Signed   By: Rise Mu M.D.   On: 03/10/2016 01:07   Mr Maxine Glenn Head/brain Wo Cm  03/10/2016  CLINICAL DATA:  Initial evaluation for episode of right-sided weakness, slurred speech. EXAM: MRI HEAD WITHOUT CONTRAST MRA HEAD WITHOUT CONTRAST TECHNIQUE: Multiplanar, multiecho pulse sequences of the brain and surrounding structures were obtained without intravenous contrast. Angiographic images of the head were obtained using MRA technique without contrast. COMPARISON:  Prior CT from earlier the same day. FINDINGS: MRI HEAD FINDINGS Cerebral volume  normal for patient age. No significant white matter disease present. Remote lacunar infarct within the left thalamus. There are patchy acute ischemic nonhemorrhagic infarcts involving the superior right cerebellar hemisphere within the right superior cerebellar artery territory. Probable faint associated petechial hemorrhage on SWI sequence (series 11, image 30) without hemorrhagic transformation. No significant mass effect. No other infarct. Gray-white matter deficient in otherwise maintained. Major intracranial vascular flow voids preserved. No mass lesion, midline shift, or mass effect. No hydrocephalus. No extra-axial fluid collection. Major dural sinuses are grossly patent. Craniocervical junction normal. Visualized upper cervical spine unremarkable. Pituitary gland within normal limits. No acute abnormality about the orbits. Paranasal ounces are clear. No mastoid effusion. Inner ear structures normal. Bone marrow signal intensity normal. No scalp soft tissue abnormality. MRA HEAD FINDINGS ANTERIOR CIRCULATION: Visualized distal cervical segments of the internal carotid arteries are patent with antegrade flow. Petrous, cavernous, and supraclinoid segments widely patent bilaterally. Ovoid saccular aneurysm measuring 10 x 6 mm present at the takeoff of the left posterior communicating artery. A1 segments, anterior communicating artery, and anterior cerebral arteries well opacified. M1 segments patent without stenosis or occlusion. MCA bifurcations normal. Distal MCA branches well opacified and symmetric. POSTERIOR CIRCULATION: Vertebral arteries patent to the vertebrobasilar junction para right vertebral artery is diminutive. Posterior inferior cerebellar arteries patent. Basilar artery widely patent to its distal aspect. Superior cerebellar arteries patent proximally. P1 segment somewhat hypoplastic bilaterally with prominent posterior communicating arteries. PCAs well opacified to their distal aspects.  IMPRESSION: MRI HEAD IMPRESSION: 1. Acute ischemic right cerebellar infarcts as above. No associated mass-effect. Associated faint petechial hemorrhage without evidence for hemorrhagic transformation.  2. Remote lacunar infarct within the left thalamus. MRA HEAD IMPRESSION: 1. No large or proximal arterial branch occlusion within the intracranial circulation. No high-grade or correctable stenosis. 2. 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 Neurovascular consultation suggested. Electronically Signed   By: Jeannine Boga M.D.   On: 03/10/2016 01:07    Microbiology: No results found for this or any previous visit (from the past 240 hour(s)).   Labs: Basic Metabolic Panel:  Recent Labs Lab 03/09/16 1330 03/09/16 1339  NA 140 142  K 4.3 4.2  CL 110 106  CO2 21*  --   GLUCOSE 112* 106*  BUN 8 9  CREATININE 0.85 0.80  CALCIUM 9.2  --    Liver Function Tests:  Recent Labs Lab 03/09/16 1330  AST 50*  ALT 36  ALKPHOS 85  BILITOT 0.6  PROT 6.9  ALBUMIN 3.5   No results for input(s): LIPASE, AMYLASE in the last 168 hours. No results for input(s): AMMONIA in the last 168 hours. CBC:  Recent Labs Lab 03/09/16 1330 03/09/16 1339  WBC 6.4  --   NEUTROABS 2.7  --   HGB 15.2* 16.3*  HCT 45.9 48.0*  MCV 92.0  --   PLT 182  --    Cardiac Enzymes: No results for input(s): CKTOTAL, CKMB, CKMBINDEX, TROPONINI in the last 168 hours. BNP: BNP (last 3 results) No results for input(s): BNP in the last 8760 hours.  ProBNP (last 3 results) No results for input(s): PROBNP in the last 8760 hours.  CBG:  Recent Labs Lab 03/10/16 0845  GLUCAP 148*       Signed:  Louellen Molder MD.  [facility] 03/10/2016, 3:32 PM

## 2016-03-10 NOTE — Evaluation (Signed)
Speech Language Pathology Evaluation Patient Details Name: Erika Wong MRN: [id number] DOB: 1966/11/09 Today's Date: 03/10/2016 Time: [id number] SLP Time Calculation (min) (ACUTE ONLY): 33 min  Problem List:  Patient Active Problem List   Diagnosis Date Noted  . Asthma 03/09/2016  . TIA (transient ischemic attack) 03/09/2016  . UTI (lower urinary tract infection) 03/09/2016  . Tobacco abuse    Past Medical History:  Past Medical History  Diagnosis Date  . Asthma   . Tobacco abuse    Past Surgical History:  Past Surgical History  Procedure Laterality Date  . Tubal ligation     HPI:  50 yo female adm [facility] with slurred speech and right sided weakness.  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    Assessment / Plan / Recommendation Clinical Impression  Pt presents with functional cognitive linguistic abilities and fluent speech/language.  She scored 27/30 on MOCA- WNL.  Cranial nerve exam unremarkable.    No SlP follow up indicated.      SLP Assessment  Patient does not need any further Speech Lanaguage Pathology Services    Follow Up Recommendations  None    Frequency and Duration     n/a      SLP Evaluation Prior Functioning  Cognitive/Linguistic Baseline: Within functional limits Vocation: Other (comment) (works in school symtem)   Cognition  Arousal/Alertness: Awake/alert Orientation Level: Oriented X4 Attention: Sustained;Focused Focused Attention: Appears intact Sustained Attention: Appears intact Memory Impairment: Retrieval deficit (3/5 words recalled independently, 2 with catergory or multiple choice cue) Awareness: Appears intact Problem Solving: Appears intact Safety/Judgment: Appears intact    Comprehension  Auditory Comprehension Overall Auditory Comprehension: Appears within functional limits for tasks assessed Yes/No Questions: Not  tested Commands: Within Functional Limits Conversation: Complex Visual Recognition/Discrimination Discrimination: Not tested Reading Comprehension Reading Status: Not tested    Expression Expression Primary Mode of Expression: Verbal Verbal Expression Overall Verbal Expression: Appears within functional limits for tasks assessed Initiation: No impairment Repetition: No impairment Naming: No impairment Pragmatics: No impairment Written Expression Dominant Hand: Right   Oral / Motor  Oral Motor/Sensory Function Overall Oral Motor/Sensory Function: Within functional limits Motor Speech Respiration: Within functional limits Resonance: Within functional limits Articulation: Within functional limitis Motor Planning: Witnin functional limits   GO          Functional Limitations: Spoken language expressive Spoken Language Expression Current Status (F6213(G9162): 0 percent impaired, limited or restricted Spoken Language Expression Goal Status (Y8657(G9163): 0 percent impaired, limited or restricted Spoken Language Expression Discharge Status [phone](G9164): 0 percent impaired, limited or restricted         Erika Burnetamara Kalayah Leske, MS [facility] SLP [phone]

## 2016-03-10 NOTE — Discharge Instructions (Signed)
Stroke Prevention Some medical conditions and behaviors are associated with an increased chance of having a stroke. You may prevent a stroke by making healthy choices and managing medical conditions. HOW CAN I REDUCE MY RISK OF HAVING A STROKE?   Stay physically active. Get at least 30 minutes of activity on most or all days.  Do not smoke. It may also be helpful to avoid exposure to secondhand smoke.  Limit alcohol use. Moderate alcohol use is considered to be:  No more than 2 drinks per day for men.  No more than 1 drink per day for nonpregnant women.  Eat healthy foods. This involves:  Eating 5 or more servings of fruits and vegetables a day.  Making dietary changes that address high blood pressure (hypertension), high cholesterol, diabetes, or obesity.  Manage your cholesterol levels.  Making food choices that are high in fiber and low in saturated fat, trans fat, and cholesterol may control cholesterol levels.  Take any prescribed medicines to control cholesterol as directed by your health care provider.  Manage your diabetes.  Controlling your carbohydrate and sugar intake is recommended to manage diabetes.  Take any prescribed medicines to control diabetes as directed by your health care provider.  Control your hypertension.  Making food choices that are low in salt (sodium), saturated fat, trans fat, and cholesterol is recommended to manage hypertension.  Ask your health care provider if you need treatment to lower your blood pressure. Take any prescribed medicines to control hypertension as directed by your health care provider.  If you are 18-39 years of age, have your blood pressure checked every 3-5 years. If you are 40 years of age or older, have your blood pressure checked every year.  Maintain a healthy weight.  Reducing calorie intake and making food choices that are low in sodium, saturated fat, trans fat, and cholesterol are recommended to manage  weight.  Stop drug abuse.  Avoid taking birth control pills.  Talk to your health care provider about the risks of taking birth control pills if you are over 35 years old, smoke, get migraines, or have ever had a blood clot.  Get evaluated for sleep disorders (sleep apnea).  Talk to your health care provider about getting a sleep evaluation if you snore a lot or have excessive sleepiness.  Take medicines only as directed by your health care provider.  For some people, aspirin or blood thinners (anticoagulants) are helpful in reducing the risk of forming abnormal blood clots that can lead to stroke. If you have the irregular heart rhythm of atrial fibrillation, you should be on a blood thinner unless there is a good reason you cannot take them.  Understand all your medicine instructions.  Make sure that other conditions (such as anemia or atherosclerosis) are addressed. SEEK IMMEDIATE MEDICAL CARE IF:   You have sudden weakness or numbness of the face, arm, or leg, especially on one side of the body.  Your face or eyelid droops to one side.  You have sudden confusion.  You have trouble speaking (aphasia) or understanding.  You have sudden trouble seeing in one or both eyes.  You have sudden trouble walking.  You have dizziness.  You have a loss of balance or coordination.  You have a sudden, severe headache with no known cause.  You have new chest pain or an irregular heartbeat. Any of these symptoms may represent a serious problem that is an emergency. Do not wait to see if the symptoms will   go away. Get medical help at once. Call your local emergency services (911 in [place] Do not drive yourself to the hospital.   This information is not intended to replace advice given to you by your health care provider. Make sure you discuss any questions you have with your health care provider.   Document Released: 01/11/2005 Document Revised: 12/25/2014 Document Reviewed:  06/06/2013 Elsevier Interactive Patient Education 2016 Elsevier Inc.  

## 2016-03-10 NOTE — Progress Notes (Signed)
STROKE TEAM PROGRESS NOTE   HISTORY OF PRESENT ILLNESS Erika Wong is an 50 y.o. female with a history of asthma presenting following onset of right arm and leg weakness as well as slurred speech. He was last known well at 4 AM today 03/09/2016. When she woke up again at 10 AM right-sided weakness was present. Symptoms have subsequently resolved. She has no previous history of stroke nor TIA. She has not been on antiplatelet therapy. He scan of the head showed no acute intracranial abnormality. NIH stroke score at the time of this evaluation was 0. Patient was not administered IV t-PA secondary to deficits resolved. She was admitted for further evaluation and treatment.   SUBJECTIVE (INTERVAL HISTORY)    OBJECTIVE Temp:  [97.8 F (36.6 C)-98.5 F (36.9 C)] 98 F (36.7 C) (03/24 1000) Pulse Rate:  [62-81] 77 (03/23 2200) Cardiac Rhythm:  [-] Normal sinus rhythm (03/24 0704) Resp:  [18-26] 20 (03/24 1000) BP: (104-128)/(52-84) 104/56 mmHg (03/24 1000) SpO2:  [93 %-100 %] 97 % (03/24 1000) Weight:  [113.853 kg (251 lb)] 113.853 kg (251 lb) (03/23 1323)  CBC:  Recent Labs Lab 03/09/16 1330 03/09/16 1339  WBC 6.4  --   NEUTROABS 2.7  --   HGB 15.2* 16.3*  HCT 45.9 48.0*  MCV 92.0  --   PLT 182  --     Basic Metabolic Panel:  Recent Labs Lab 03/09/16 1330 03/09/16 1339  NA 140 142  K 4.3 4.2  CL 110 106  CO2 21*  --   GLUCOSE 112* 106*  BUN 8 9  CREATININE 0.85 0.80  CALCIUM 9.2  --     Lipid Panel:    Component Value Date/Time   CHOL 146 03/10/2016 0527   TRIG 147 03/10/2016 0527   HDL 39* 03/10/2016 0527   CHOLHDL 3.7 03/10/2016 0527   VLDL 29 03/10/2016 0527   LDLCALC 78 03/10/2016 0527   HgbA1c: No results found for: HGBA1C Urine Drug Screen:    Component Value Date/Time   LABOPIA NONE DETECTED 03/09/2016 1605   COCAINSCRNUR NONE DETECTED 03/09/2016 1605   LABBENZ NONE DETECTED 03/09/2016 1605   AMPHETMU NONE DETECTED 03/09/2016 1605   THCU POSITIVE*  03/09/2016 1605   LABBARB NONE DETECTED 03/09/2016 1605      IMAGING  Ct Head Wo Contrast 03/09/2016   No intracranial hemorrhage, mass effect or midline shift. No definite acute cortical infarction. No hydrocephalus.   MRI HEAD  03/10/2016   1. Acute ischemic right cerebellar infarcts as above. No associated mass-effect. Associated faint petechial hemorrhage without evidence for hemorrhagic transformation. 2. Remote lacunar infarct within the left thalamus.   MRA HEAD  03/10/2016   1. No large or proximal arterial branch occlusion within the intracranial circulation. No high-grade or correctable stenosis. 2. 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 Neurovascular consultation suggested.   Carotid Doppler   There is 1-39% bilateral ICA stenosis. Vertebral artery flow is antegrade.    2D Echocardiogram  EF 60-65% with no source of embolus.    PHYSICAL EXAM Pleasant middle aged obese african american lady not in distress. . Afebrile. Head is nontraumatic. Neck is supple without bruit.    Cardiac exam no murmur or gallop. Lungs are clear to auscultation. Distal pulses are well felt. Neurological Exam ;  Awake  Alert oriented x 3. Normal speech and language.eye movements full without nystagmus.fundi were not visualized. Vision acuity and fields appear normal. Hearing is normal. Palatal movements are normal. Face symmetric.  Tongue midline. Normal strength, tone, reflexes and coordination. Normal sensation. Gait deferred. ASSESSMENT/PLAN [name] is a 50 y.o. female with history of asthmanting with right arm and leg weakness and slurred speech. She did not receive IV t-PA due to deficits resolved.   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 Old left thalamic lacune  MRA  No large vessel occlusion. 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 Cardiology can arrange.  LDL 78  HgbA1c pending  Lovenox 40 mg sq daily for VTE prophylaxis  Diet regular Room service appropriate?: Yes; Fluid consistency:: Thin  No antithrombotic prior to admission, now on aspirin 325 mg daily  Patient counseled to be compliant with her antithrombotic medications  Ongoing aggressive stroke risk factor management  Therapy recommendations:  No therapy needs  Disposition:  Return home  Aneurysm  8mm L PCOM aneurysm  Incidental finding  Needs followup at discharge - recommend Dr. Corliss Skainseveshwar to follow-up  Hyperlipidemia  Home meds:  No statin  LDL 78, goal < 70  Added low-dose statin  Continue statin at discharge  Other Stroke Risk Factors  Cigarette smoker, advised to stop smoking  ETOH use  THC use. UDS positive this admission  Morbid Obesity, Body mass index is 44.47 kg/(m^2). patient advised to exercise and lose weight  Other Active Problems  UTI - on rocephin  Hospital day #   Rhoderick MoodyBIBY,SHARON  [facility] See Amion for Pager information 03/10/2016 3:10 PM  I have personally examined this patient, reviewed notes, independently viewed imaging studies, participated in medical decision making and plan of care. I have made any additions or clarifications directly to the above note. Agree with note above. She presented with right arm and leg weakness and slurred speech due to small right cerebellar infarct likely of embolic etiology. She remains at risk for recurrent stroke, TIA, neurological worsening and needs ongoing stroke evaluation and aggressive risk factor modification. She also has an incidental left posterior communicating artery aneurysm. I spoke to her the risk of aneurysm rupture and treatment  options. Agree with outpatient consultation visit neurosurgery for aneurysm follow-up and treatment  Delia HeadyPramod Zelma Snead, MD Medical Director [facility] Pager: [phone] 03/10/2016 6:04 PM    To contact Stroke Continuity provider, please refer to [email or web address]. After hours, contact General Neurology

## 2016-03-10 NOTE — Progress Notes (Addendum)
CC:  Chief Complaint  Patient presents with  . Extremity Weakness    HPI: Erika Wong is a 50 y.o. female admitted to the hospital yesterday with sudden onset of right arm and leg numbness, imbalance while walking, and slurred speech. The symptoms resolved spontaneously. She was admitted for further stroke w/u including MRI/MRA demonstrated embolic right cerebellar strokes, and a likely incidental left Pcom aneurysm.  Of note, she denies history of HTN, does smoke ~1ppd. FHx of aneurysm in her aunt.  PMH: Past Medical History  Diagnosis Date  . Asthma   . Tobacco abuse     PSH: Past Surgical History  Procedure Laterality Date  . Tubal ligation      SH: Social History  Substance Use Topics  . Smoking status: Current Every Day Smoker -- 1.00 packs/day    Types: Cigarettes  . Smokeless tobacco: None  . Alcohol Use: Yes     Comment: occ    MEDS: Prior to Admission medications   Medication Sig Start Date End Date Taking? Authorizing Provider  benzonatate (TESSALON) 100 MG capsule Take 1 capsule (100 mg total) by mouth every 8 (eight) hours. Patient not taking: Reported on 07/05/2015 02/02/14   Charlestine Nighthristopher Lawyer, PA-C  Guaifenesin 1200 MG TB12 Take 1 tablet (1,200 mg total) by mouth 2 (two) times daily. Patient not taking: Reported on 07/05/2015 02/02/14   Charlestine Nighthristopher Lawyer, PA-C  naproxen (NAPROSYN) 500 MG tablet Take 1 tablet (500 mg total) by mouth 2 (two) times daily. Patient not taking: Reported on 03/09/2016 12/14/14   Jaynie Crumbleatyana Kirichenko, PA-C  promethazine-dextromethorphan (PROMETHAZINE-DM) 6.25-15 MG/5ML syrup Take 5 mLs by mouth 4 (four) times daily as needed for cough. Patient not taking: Reported on 07/05/2015 02/02/14   Charlestine Nighthristopher Lawyer, PA-C    ALLERGY: Allergies  Allergen Reactions  . Hydrocodone Other (See Comments)    "Makes me feel funny"    ROS: ROS  NEUROLOGIC EXAM: Awake, alert, oriented Memory and concentration grossly intact Speech fluent,  appropriate CN grossly intact Motor exam: Upper Extremities Deltoid Bicep Tricep Grip  Right 5/5 5/5 5/5 5/5  Left 5/5 5/5 5/5 5/5   Lower Extremity IP Quad PF DF EHL  Right 5/5 5/5 5/5 5/5 5/5  Left 5/5 5/5 5/5 5/5 5/5   Sensation grossly intact to LT  [facility] MRA reviewed, demonstrating an ~6210mm posteriorly projecting left Pcom aneurysm.  IMPRESSION: - 50 y.o. female with incidentally discovered left Pcom aneurysm during w/u for right cerebellar stroke also seen on MRI.  PLAN: - Will plan on diagnostic cerebral angiogram, spoke with primary team and will plan on doing this on an outpatient basis. She can f/u with me in the next week or two.  I have reviewed the MRI/MRA findings with the patient. Recommendation for angiogram was discussed as was the procedure. All questions were answered. She is willing to proceed with angiogram.

## 2016-03-10 NOTE — Evaluation (Signed)
Physical Therapy Evaluation and Discharge Patient Details Name: Eurika Sandy MRN: [id number] DOB: April 06, 1966 Today's Date: 03/10/2016   History of Present Illness  50 y.o. female admitted to the hospital yesterday with sudden onset of right arm and leg numbness, imbalance while walking, and slurred speech. The symptoms resolved spontaneously. 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    Clinical Impression  Patient evaluated by Physical Therapy with no further acute PT needs identified. All education has been completed and questions answered. PT is signing off. Thank you for this referral.     Follow Up Recommendations No PT follow up    Equipment Recommendations  None recommended by PT    Recommendations for Other Services       Precautions / Restrictions Restrictions Weight Bearing Restrictions: No      Mobility  Bed Mobility Overal bed mobility: Modified Independent                Transfers Overall transfer level: Independent Equipment used: None                Ambulation/Gait Ambulation/Gait assistance: Independent Ambulation Distance (Feet): 300 Feet Assistive device: None Gait Pattern/deviations: WFL(Within Functional Limits)   Gait velocity interpretation: at or above normal speed for age/gender    Stairs Stairs: Yes Stairs assistance: Independent Stair Management: No rails;Alternating pattern;Forwards Number of Stairs: 5    Wheelchair Mobility    Modified Rankin (Stroke Patients Only) Modified Rankin (Stroke Patients Only) Pre-Morbid Rankin Score: No symptoms Modified Rankin: No symptoms     Balance Overall balance assessment: Independent                               Standardized Balance Assessment Standardized Balance Assessment : Berg Balance Test;Dynamic Gait Index Berg Balance Test Sit to Stand: Able to stand without using hands and stabilize  independently Standing Unsupported: Able to stand safely 2 minutes Sitting with Back Unsupported but Feet Supported on Floor or Stool: Able to sit safely and securely 2 minutes Stand to Sit: Sits safely with minimal use of hands Transfers: Able to transfer safely, minor use of hands Standing Unsupported with Eyes Closed: Able to stand 10 seconds safely Standing Ubsupported with Feet Together: Able to place feet together independently but unable to hold for 30 seconds From Standing, Reach Forward with Outstretched Arm: Can reach confidently >25 cm (10") From Standing Position, Pick up Object from Floor: Able to pick up shoe safely and easily From Standing Position, Turn to Look Behind Over each Shoulder: Looks behind from both sides and weight shifts well Turn 360 Degrees: Able to turn 360 degrees safely in 4 seconds or less Standing Unsupported, Alternately Place Feet on Step/Stool: Able to stand independently and safely and complete 8 steps in 20 seconds Standing Unsupported, One Foot in Front: Able to plae foot ahead of the other independently and hold 30 seconds Standing on One Leg: Able to lift leg independently and hold equal to or more than 3 seconds Total Score: 51 Dynamic Gait Index Level Surface: Normal Change in Gait Speed: Normal Gait with Horizontal Head Turns: Normal Gait with Vertical Head Turns: Normal Gait and Pivot Turn: Normal Step Over Obstacle: Mild Impairment Step Around Obstacles: Mild Impairment Steps: Normal Total Score: 22       Pertinent Vitals/Pain Pain Assessment: No/denies pain    Home Living Family/patient expects to be discharged to:: Private residence Living  Arrangements: Parent (father) Available Help at Discharge: Family;Available PRN/intermittently Type of Home: House Home Access: Ramped entrance     Home Layout: One level Home Equipment: Shower seat      Prior Function Level of Independence: Independent         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     Higher education careers adviserHand Dominance   Dominant Hand: Right    Extremity/Trunk Assessment   Upper Extremity Assessment: Overall WFL for tasks assessed           Lower Extremity Assessment: Overall WFL for tasks assessed (including coordintation)      Cervical / Trunk Assessment: Other exceptions  Communication   Communication: No difficulties  Cognition Arousal/Alertness: Awake/alert Behavior During Therapy: WFL for tasks assessed/performed Overall Cognitive Status: Within Functional Limits for tasks assessed                      General Comments General comments (skin integrity, edema, etc.): Educated patient on her risk factors. Pt very motivated to make changes and encouraged her to begin walking for exercise.     Exercises        Assessment/Plan    PT Assessment Patent does not need any further PT services  PT Diagnosis Hemiplegia dominant side   PT Problem List    PT Treatment Interventions     PT Goals (Current goals can be found in the Care Plan section) Acute Rehab PT Goals Patient Stated Goal: to go home PT Goal Formulation: All assessment and education complete, DC therapy    Frequency     Barriers to discharge        Co-evaluation               End of Session   Activity Tolerance: Patient tolerated treatment well Patient left: in bed;with call bell/phone within reach Nurse Communication: Mobility status (no PT needs)         Time: [id number] PT Time Calculation (min) (ACUTE ONLY): 19 min   Charges:   PT Evaluation $PT Eval Low Complexity: 1 Procedure     PT G Codes:        Amberia Bayless 03/10/2016, 3:03 PM Pager [phone]

## 2016-03-10 NOTE — Progress Notes (Signed)
  Echocardiogram 2D Echocardiogram has been performed.  Erika Wong 03/10/2016, 1:16 PM

## 2016-03-10 NOTE — Progress Notes (Signed)
*  PRELIMINARY RESULTS* Vascular Ultrasound Carotid Duplex (Doppler) has been completed.  Preliminary findings: Bilateral: No significant (1-39%) ICA stenosis. Antegrade vertebral flow.    Farrel DemarkJill Eunice, RDMS, RVT  03/10/2016, 11:40 AM

## 2016-03-10 NOTE — Care Management Note (Signed)
Case Management Note  Patient Details  Name: Erika Wong MRN: [id number] Date of Birth: October 14, 1966  Subjective/Objective:       CM met with patient to discuss discharge planning needs.  Patient does not currently have a PCP.  CM made an appointment April 10th at 1100 at the [facility] (currently taking patients for the [facility]) to establish a PCP.  Patient was also given written information on the [facility] and was made aware that she will be able to utilize their pharmacy.  CM discussed medication costs and a medication assistance coupon for Lipitor was provided, should the patient decide to have her medications filled at [facility].  Cipro is on the $4 medication list.  All information regarding follow-up was added to the AVS and patient is agreeable to the plan. Bedside RN updated.             Action/Plan:   Expected Discharge Date:  03/12/16               Expected Discharge Plan:  Home/Self Care  In-House Referral:     Discharge planning Services  CM Consult, Follow-up appt scheduled, [facility], Medication Assistance  Post Acute Care Choice:    Choice offered to:  Patient  DME Arranged:    DME Agency:     HH Arranged:    [place] Agency:     Status of Service:  Completed, signed off  Medicare Important Message Given:    Date Medicare IM Given:    Medicare IM give by:    Date Additional Medicare IM Given:    Additional Medicare Important Message give by:     If discussed at [place] of Stay Meetings, dates discussed:    Additional CommentsRolm Baptise, RN 03/10/2016, 4:15 PM [phone]

## 2016-03-10 NOTE — Progress Notes (Signed)
PT Cancellation Note  Patient Details Name: Esaw GrandchildLeah Geisler MRN: [id number] DOB: 25-Feb-1966   Cancelled Treatment:    Reason Eval/Treat Not Completed: Patient at procedure or test/unavailable With OT.   Chauncy Mangiaracina 03/10/2016, 2:31 PM Pager [phone]

## 2016-03-10 NOTE — Progress Notes (Signed)
Pt is being discharged home. Discharge instructions were given to patient and family 

## 2016-03-10 NOTE — Evaluation (Signed)
Occupational Therapy Evaluation Patient Details Name: Erika Wong MRN: [id number] DOB: 10-09-1966 Today's Date: 03/10/2016    History of Present Illness 50 y.o. female admitted to the hospital yesterday with sudden onset of right arm and leg numbness, imbalance while walking, and slurred speech. The symptoms resolved spontaneously. 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   Clinical Impression   Pt reports she was independent with ADLs and mobility PTA. Currently pt overall at a mod I level for ADLs and functional mobility. Pt able to perform higher level balance activities with no LOB noted. Educated pt on home safety and signs/symptoms of stroke. Pt planning to d/c home with intermittent supervision from family. No further acute OT needs identified; signing off at this time. Please re-consult if needs change. Thank you for this referral.     Follow Up Recommendations  No OT follow up;Supervision - Intermittent    Equipment Recommendations  None recommended by OT    Recommendations for Other Services PT consult     Precautions / Restrictions Restrictions Weight Bearing Restrictions: No      Mobility Bed Mobility Overal bed mobility: Modified Independent                Transfers Overall transfer level: Modified independent Equipment used: None                  Balance Overall balance assessment: No apparent balance deficits (not formally assessed)                                          ADL Overall ADL's : At baseline;Modified independent                                       General ADL Comments: Pt reports she feels as if she has returned to baseline level of function. Simulated tub shower transfer, performed functional mobility and higher level balance activities in room; pt with no LOB and demonstrated safety throughout.      Vision Vision Assessment?: No  apparent visual deficits   Perception     Praxis      Pertinent Vitals/Pain Pain Assessment: No/denies pain     Hand Dominance Right   Extremity/Trunk Assessment Upper Extremity Assessment Upper Extremity Assessment: Overall WFL for tasks assessed   Lower Extremity Assessment Lower Extremity Assessment: Defer to PT evaluation   Cervical / Trunk Assessment Cervical / Trunk Assessment: Other exceptions Cervical / Trunk Exceptions: obesity   Communication Communication Communication: No difficulties   Cognition Arousal/Alertness: Awake/alert Behavior During Therapy: WFL for tasks assessed/performed Overall Cognitive Status: Within Functional Limits for tasks assessed                     General Comments       Exercises       Shoulder Instructions      Home Living Family/patient expects to be discharged to:: Private residence Living Arrangements: Parent (father) Available Help at Discharge: Family;Available PRN/intermittently Type of Home: House Home Access: Ramped entrance     Home Layout: One level     Bathroom Shower/Tub: Tub/shower unit Shower/tub characteristics: Curtain Firefighter: Handicapped height     Home Equipment: Shower seat          Prior  Functioning/Environment Level of Independence: Independent        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    OT Diagnosis: Generalized weakness   OT Problem List:     OT Treatment/Interventions:      OT Goals(Current goals can be found in the care plan section) Acute Rehab OT Goals Patient Stated Goal: to go home OT Goal Formulation: All assessment and education complete, DC therapy  OT Frequency:     Barriers to D/C:            Co-evaluation              End of Session    Activity Tolerance: Patient tolerated treatment well Patient left: in bed;with call bell/phone within reach   Time: 1423-1437 OT Time Calculation (min): 14 min Charges:  OT General  Charges $OT Visit: 1 Procedure OT Evaluation $OT Eval Low Complexity: 1 Procedure G-Codes:     Gaye AlkenBailey A Shoshanna Mcquitty M.S., OTR/L Pager: [phone]  03/10/2016, 2:45 PM

## 2016-03-11 LAB — URINE CULTURE: Special Requests: NORMAL

## 2016-03-11 LAB — BETA-2-GLYCOPROTEIN I ABS, IGG/M/A
Beta-2-Glycoprotein I IgA: <9 GPI IgA units (ref 0–25)
Beta-2-Glycoprotein I IgM: <9 GPI IgM units (ref 0–32)

## 2016-03-11 LAB — HIV ANTIBODY (ROUTINE TESTING W REFLEX): HIV SCREEN 4TH GENERATION: NONREACTIVE

## 2016-03-11 LAB — PROTEIN S, TOTAL: Protein S Ag, Total: 103 % (ref 60–150)

## 2016-03-11 LAB — PROTEIN S ACTIVITY: Protein S Activity: 59 % — ABNORMAL LOW (ref 63–140)

## 2016-03-11 LAB — PROTEIN C ACTIVITY: PROTEIN C ACTIVITY: 113 % (ref 73–180)

## 2016-03-11 LAB — HEMOGLOBIN A1C
Hgb A1c MFr Bld: 5.8 % — ABNORMAL HIGH (ref 4.8–5.6)
MEAN PLASMA GLUCOSE: 120 mg/dL

## 2016-03-11 LAB — PROTEIN C, TOTAL: PROTEIN C, TOTAL: 75 % (ref 60–150)

## 2016-03-12 LAB — CARDIOLIPIN ANTIBODIES, IGG, IGM, IGA
Anticardiolipin IgA: <9 APL U/mL (ref 0–11)
Anticardiolipin IgM: <9 MPL U/mL (ref 0–12)

## 2016-03-12 LAB — HOMOCYSTEINE: HOMOCYSTEINE-NORM: 11.3 umol/L (ref 0.0–15.0)

## 2016-03-13 LAB — FANA STAINING PATTERNS: Homogeneous Pattern: 1:80 {titer}

## 2016-03-13 LAB — LUPUS ANTICOAGULANT PANEL
DRVVT: 43 s (ref 0.0–44.0)
PTT Lupus Anticoagulant: 47.9 s — ABNORMAL HIGH (ref 0.0–43.6)

## 2016-03-13 LAB — HEXAGONAL PHASE PHOSPHOLIPID: Hexagonal Phase Phospholipid: 14 s — ABNORMAL HIGH (ref 0–11)

## 2016-03-13 LAB — PTT-LA MIX: PTT-LA MIX: 42.6 s — AB (ref 0.0–40.6)

## 2016-03-13 LAB — ANTINUCLEAR ANTIBODIES, IFA: ANA Ab, IFA: POSITIVE — AB

## 2016-03-15 LAB — FACTOR 5 LEIDEN

## 2016-03-17 ENCOUNTER — Other Ambulatory Visit: Payer: Self-pay | Admitting: Neurosurgery

## 2016-03-17 ENCOUNTER — Other Ambulatory Visit (HOSPITAL_COMMUNITY): Payer: Self-pay | Admitting: Neurosurgery

## 2016-03-17 DIAGNOSIS — I729 Aneurysm of unspecified site: Secondary | ICD-10-CM

## 2016-03-17 LAB — PROTHROMBIN GENE MUTATION

## 2016-03-23 NOTE — Progress Notes (Signed)
Cardiology Office Note   Date:  03/24/2016   ID:  Erika Wong, DOB 1966/11/02, MRN [id number]  PCP:  No primary care provider on file.  Cardiologist:   Rollene Rotunda, MD   Chief Complaint  Patient presents with  . Cerebrovascular Accident      History of Present Illness: Erika Wong is a 50 y.o. female who presents for evaluation after a CVA. She was in the hospital earlier this year. He did review these records. 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 This may be an incidental finding. 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 TTE was unremarkable. The patient has no past cardiac history. Particular she denies any palpitations, presyncope or syncope. She has no chest pressure, neck or arm discomfort.  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 With this she denies any cardiovascular symptoms. The stroke left her with some residual visual problems.  Past Medical History  Diagnosis Date  . Asthma   . Tobacco abuse   . Stroke [facility])     Past Surgical History  Procedure Laterality Date  . Tubal ligation       Current Outpatient Prescriptions  Medication Sig Dispense Refill  . aspirin 325 MG tablet Take 1 tablet (325 mg total) by mouth daily. 30 tablet 0  . atorvastatin (LIPITOR) 10 MG tablet Take 1 tablet (10 mg total) by mouth daily at 6 PM. 30 tablet 0  . varenicline (CHANTIX CONTINUING MONTH PAK) 1 MG tablet Take 1 tablet (1 mg total) by mouth 2 (two) times daily. 60 tablet 1  . varenicline (CHANTIX STARTING MONTH PAK) 0.5 MG X 11 & 1 MG X 42 tablet Take one 0.5 mg tablet by mouth once daily for 3 days, then increase to one 0.5 mg tablet twice daily for 4 days, then increase to one 1 mg tablet twice daily. 53 tablet 0   No current facility-administered medications for this visit.    Allergies:   Hydrocodone    Social History:  The patient  reports that she has been  smoking Cigarettes.  She has a 35 pack-year smoking history. She does not have any smokeless tobacco history on file. She reports that she drinks alcohol. She reports that she does not use illicit drugs.   Family History:  The patient's family history includes Asthma in her brother, father, and sister; COPD in her father; Congestive Heart Failure in her mother; Diabetes in her mother and sister; Heart attack (age of onset: 71) in her paternal grandmother.    ROS:  Please see the history of present illness.   Otherwise, review of systems are positive for none.   All other systems are reviewed and negative.    PHYSICAL EXAM: VS:  BP 124/84 mmHg  Pulse 87  Ht  (1.6 m)  Wt 256 lb 3.2 oz (116.212 kg)  BMI 45.40 kg/m2  LMP 03/09/2016 , BMI Body mass index is 45.4 kg/(m^2). GENERAL:  Well appearing HEENT:  Pupils equal round and reactive, fundi not visualized, oral mucosa unremarkable NECK:  No jugular venous distention, waveform within normal limits, carotid upstroke brisk and symmetric, no bruits, no thyromegaly LYMPHATICS:  No cervical, inguinal adenopathy LUNGS:  Clear to auscultation bilaterally BACK:  No CVA tenderness CHEST:  Unremarkable HEART:  PMI not displaced or sustained,S1 and S2 within normal limits, no S3, no S4, no clicks, no rubs,  no murmurs ABD:  Flat, positive bowel sounds normal in frequency in pitch, no bruits, no rebound, no guarding, no midline pulsatile mass, no hepatomegaly, no splenomegaly EXT:  2 plus pulses throughout, no edema, no cyanosis no clubbing SKIN:  No rashes no nodules NEURO:  Cranial nerves II through XII grossly intact, motor grossly intact throughout PSYCH:  Cognitively intact, oriented to person place and time    EKG:  EKG is not ordered today. The ekg ordered 3/23/17demonstrates sinus rhythm, rate 75, axis within normal limits, intervals within normal limits, no acute ST-T wave changes area   Recent Labs: 03/09/2016: ALT 36; BUN 9;  Creatinine, Ser 0.80; Hemoglobin 16.3*; Platelets 182; Potassium 4.2; Sodium 142    Lipid Panel    Component Value Date/Time   CHOL 146 03/10/2016 0527   TRIG 147 03/10/2016 0527   HDL 39* 03/10/2016 0527   CHOLHDL 3.7 03/10/2016 0527   VLDL 29 03/10/2016 0527   LDLCALC 78 03/10/2016 0527      Wt Readings from Last 3 Encounters:  03/24/16 256 lb 3.2 oz (116.212 kg)  03/09/16 251 lb (113.853 kg)  10/09/12 250 lb (113.399 kg)      Other studies Reviewed: Additional studies/ records that were reviewed today include: Hospital records. Review of the above records demonstrates:  Please see elsewhere in the note.     ASSESSMENT AND PLAN:  CVA:  I will start with a 21 day event monitor but she'll likely need an implanted loop. I will also order a TEE to look for embolic source.  TOBACCO: She does want to try Chantix.  We discussed all of the potential side effects and in particular I reviewed the [employer] Warning.  He has no depression.  He understands and wishes to try this medication.  OBESITY:  The patient understands the need to lose weight with diet and exercise. We have discussed specific strategies for this.    Current medicines are reviewed at length with the patient today.  The patient does not have concerns regarding medicines.  The following changes have been made:    As above  Labs/ tests ordered today include:   Orders Placed This Encounter  Procedures  . Cardiac event monitor  . TRANSESOPHAGEAL ECHOCARDIOGRAM W/O CARDIOVERSION     Disposition:   FU with me after the event monitor.      Signed, Rollene RotundaJames Shaquela Weichert, MD  03/24/2016 8:58 AM    [facility]

## 2016-03-24 ENCOUNTER — Encounter: Payer: Self-pay | Admitting: Cardiology

## 2016-03-24 ENCOUNTER — Ambulatory Visit (INDEPENDENT_AMBULATORY_CARE_PROVIDER_SITE_OTHER): Payer: Self-pay | Admitting: Cardiology

## 2016-03-24 VITALS — BP 124/84 | HR 87 | Ht 63.0 in | Wt 256.2 lb

## 2016-03-24 DIAGNOSIS — G459 Transient cerebral ischemic attack, unspecified: Secondary | ICD-10-CM

## 2016-03-24 MED ORDER — VARENICLINE TARTRATE 1 MG PO TABS: 1.0000 mg | ORAL_TABLET | Freq: Two times a day (BID) | ORAL | Status: DC

## 2016-03-24 MED ORDER — VARENICLINE TARTRATE 0.5 MG X 11 & 1 MG X 42 PO MISC: ORAL | Status: DC

## 2016-03-24 NOTE — Patient Instructions (Signed)
Your physician recommends that you schedule a follow-up appointment in: After Event Monitor  Your physician has requested that you have a TEE. During a TEE, sound waves are used to create images of your heart. It provides your doctor with information about the size and shape of your heart and how well your heart's chambers and valves are working. In this test, a transducer is attached to the end of a flexible tube that's guided down your throat and into your esophagus (the tube leading from you mouth to your stomach) to get a more detailed image of your heart. You are not awake for the procedure. Please see the instruction sheet given to you today. For further information please visit [email or web address].   Your physician has recommended that you wear an event monitor. Event monitors are medical devices that record the heart's electrical activity. Doctors most often us these monitors to diagnose arrhythmias. Arrhythmias are problems with the speed or rhythm of the heartbeat. The monitor is a small, portable device. You can wear one while you do your normal daily activities. This is usually used to diagnose what is causing palpitations/syncope (passing out).

## 2016-03-27 ENCOUNTER — Encounter: Payer: Self-pay | Admitting: Family Medicine

## 2016-03-27 ENCOUNTER — Ambulatory Visit (INDEPENDENT_AMBULATORY_CARE_PROVIDER_SITE_OTHER): Payer: Self-pay | Admitting: Family Medicine

## 2016-03-27 VITALS — BP 121/54 | HR 83 | Temp 98.1°F | Resp 18 | Ht 63.0 in | Wt 254.0 lb

## 2016-03-27 DIAGNOSIS — Z720 Tobacco use: Secondary | ICD-10-CM

## 2016-03-27 DIAGNOSIS — Z23 Encounter for immunization: Secondary | ICD-10-CM

## 2016-03-27 DIAGNOSIS — R7303 Prediabetes: Secondary | ICD-10-CM | POA: Insufficient documentation

## 2016-03-27 DIAGNOSIS — R748 Abnormal levels of other serum enzymes: Secondary | ICD-10-CM

## 2016-03-27 DIAGNOSIS — E049 Nontoxic goiter, unspecified: Secondary | ICD-10-CM

## 2016-03-27 DIAGNOSIS — Z8673 Personal history of transient ischemic attack (TIA), and cerebral infarction without residual deficits: Secondary | ICD-10-CM | POA: Insufficient documentation

## 2016-03-27 LAB — COMPLETE METABOLIC PANEL WITH GFR
ALT: 23 U/L (ref 6–29)
AST: 28 U/L (ref 10–35)
Albumin: 3.8 g/dL (ref 3.6–5.1)
Alkaline Phosphatase: 83 U/L (ref 33–130)
BUN: 14 mg/dL (ref 7–25)
CALCIUM: 9.3 mg/dL (ref 8.6–10.4)
CO2: 22 mmol/L (ref 20–31)
CREATININE: 0.59 mg/dL (ref 0.50–1.05)
Chloride: 107 mmol/L (ref 98–110)
GFR, Est African American: >89 mL/min (ref >=60)
GFR, Est Non African American: >89 mL/min (ref >=60)
GLUCOSE: 114 mg/dL — AB (ref 65–99)
POTASSIUM: 4.4 mmol/L (ref 3.5–5.3)
Sodium: 139 mmol/L (ref 135–146)
Total Bilirubin: 0.5 mg/dL (ref 0.2–1.2)
Total Protein: 6.6 g/dL (ref 6.1–8.1)

## 2016-03-27 LAB — TSH: TSH: 0.5 m[IU]/L

## 2016-03-27 NOTE — Progress Notes (Signed)
Subjective:    Patient ID: Erika Wong, female    DOB: 18-Jul-1966, 50 y.o.   MRN: [id number]  HPI  [name], a 50 year old female that presents accompanied by husband for a post hospital follow up and to establish care. [name] was recently hospitalized for a CVA. Patient presented to the emergency department on 03/09/2016 after an episode of right sided weakness, ear ringing, and slurred speech for a few hours. She was admitted to the inpatient unit for a TIA. CT of head was negative for acute intracranial abnormalities. MRI showed acute ischemic right cerebellar infarcts without evidence of hemorrhage. 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 She was evaluated in cardiology 1 week ago and is scheduled to receive a TEE and loop recorder on 03/29/2016. She is also scheduled to follow up with Dr. Conchita Wong, neurosurgeon for a cerebral angiogram. [name] was discharged home on Lipitor and aspirin 325 mg daily. She reports that she has been taking medications consistently. She denies headache, fatigue, weakness, dizziness, bilateral lower extremity edema, shortness of breath, nausea, vomiting,or diarrhea.   She has been a chronic everyday smoker for the past 35 years. She was recently started on Chantix and has not been taking medication consistently.   Past Medical History  Diagnosis Date  . Asthma   . Tobacco abuse   . Stroke Erika Wong(HCC)    Immunization History  Administered Date(s) Administered  . Pneumococcal Polysaccharide-23 03/27/2016  . Tdap 03/27/2016   Past Surgical History  Procedure Laterality Date  . Tubal ligation     Allergies  Allergen Reactions  . Hydrocodone Other (See Comments)    "Makes me feel funny"    Review of Systems  Constitutional: Negative.  Negative for unexpected weight change.  HENT: Negative.   Eyes: Negative.  Negative for photophobia and visual disturbance.  Respiratory: Negative.  Negative for apnea and chest tightness.    Cardiovascular: Negative.  Negative for chest pain, palpitations and leg swelling.  Gastrointestinal: Negative.   Endocrine: Negative.  Negative for cold intolerance, heat intolerance, polydipsia, polyphagia and polyuria.  Genitourinary: Negative.   Musculoskeletal: Negative.   Allergic/Immunologic: Negative.  Negative for immunocompromised state.  Neurological: Negative for dizziness, facial asymmetry, light-headedness and headaches.  Hematological: Negative.   Psychiatric/Behavioral: Negative.        Objective:   Physical Exam  Constitutional: She is oriented to person, place, and time. She appears well-developed and well-nourished.  HENT:  Head: Normocephalic and atraumatic.  Right Ear: Hearing, tympanic membrane and external ear normal.  Left Ear: Hearing, tympanic membrane and external ear normal.  Nose: Nose normal.  Mouth/Throat: Uvula is midline and oropharynx is clear and moist.  Eyes: Conjunctivae, EOM and lids are normal. Pupils are equal, round, and reactive to light.  Neck: Normal range of motion. Neck supple.  Cardiovascular: Normal rate, regular rhythm, normal heart sounds and intact distal pulses.  Exam reveals no gallop and no friction rub.   No murmur heard. Pulmonary/Chest: Effort normal and breath sounds normal.  Abdominal: Soft. Bowel sounds are normal.  Increased abdominal girth  Musculoskeletal: Normal range of motion.  Neurological: She is alert and oriented to person, place, and time. She has normal strength and normal reflexes. She displays no atrophy, no tremor and normal reflexes. No cranial nerve deficit or sensory deficit. She exhibits normal muscle tone. She displays no seizure activity. Coordination and gait normal.  Reflex Scores:      Tricep reflexes  are 2+ on the right side and 2+ on the left side.      Bicep reflexes are 2+ on the right side and 2+ on the left side.      Brachioradialis reflexes are 2+ on the right side and 2+ on the left side.       Patellar reflexes are 2+ on the right side and 2+ on the left side.      Achilles reflexes are 2+ on the right side and 2+ on the left side. Skin: Skin is warm and dry.  Psychiatric: She has a normal mood and affect. Her behavior is normal. Judgment and thought content normal.       BP 121/54 mmHg  Pulse 83  Temp(Src) 98.1 F (36.7 C) (Oral)  Resp 18  Ht  (1.6 Wong)  Wt 254 lb (115.214 kg)  BMI 45.01 kg/m2  SpO2 100%  LMP 03/09/2016 Assessment & Plan:   1. History of CVA (cerebrovascular accident) [name] and I discussed the importance of Aspirin 325 mg daily an Lipitor 10 mg daily. She is scheduled to follow up with neurosurgery for left aneurysm.   2. Prediabetes Recommend a lowfat, low carbohydrate diet divided over 5-6 small meals, increase water intake to 6-8 glasses, and 150 minutes per week of cardiovascular exercise.  Will repeat hemoglobin a1C in 6 months.    3. Elevated liver enzymes  - COMPLETE METABOLIC PANEL WITH GFR  4. Goiter - TSH  5. Need for Tdap vaccination  - Tdap vaccine greater than or equal to 7yo IM  6. Immunization due  - Pneumococcal polysaccharide vaccine 23-valent greater than or equal to 2yo subcutaneous/IM  7. Tobacco abuse Smoking cessation instruction/counseling given:  counseled patient on the dangers of tobacco use, advised patient to stop smoking, and reviewed strategies to maximize success    Routine Health Maintenance:   Recommend yearly opthalmology visit Referral for mammogram Pap smear: will schedule in 3 months     Erika Menard M, FNP

## 2016-03-28 ENCOUNTER — Other Ambulatory Visit: Payer: Self-pay | Admitting: Cardiology

## 2016-03-28 DIAGNOSIS — G458 Other transient cerebral ischemic attacks and related syndromes: Secondary | ICD-10-CM

## 2016-03-29 ENCOUNTER — Encounter (HOSPITAL_COMMUNITY)
Admission: RE | Disposition: A | Discharge status: Home / Self Care | Payer: Self-pay | Source: Ambulatory Visit | Attending: Cardiovascular Disease

## 2016-03-29 ENCOUNTER — Ambulatory Visit (HOSPITAL_COMMUNITY)
Admission: RE | Admit: 2016-03-29 | Discharge: 2016-03-29 | Disposition: A | Discharge status: Home / Self Care | Payer: Self-pay | Source: Ambulatory Visit | Attending: Cardiovascular Disease | Admitting: Cardiovascular Disease

## 2016-03-29 ENCOUNTER — Ambulatory Visit (HOSPITAL_BASED_OUTPATIENT_CLINIC_OR_DEPARTMENT_OTHER)
Admission: RE | Admit: 2016-03-29 | Discharge: 2016-03-29 | Disposition: A | Discharge status: Home / Self Care | Payer: Self-pay | Source: Ambulatory Visit | Attending: Cardiology | Admitting: Cardiology

## 2016-03-29 ENCOUNTER — Encounter (HOSPITAL_COMMUNITY): Payer: Self-pay | Admitting: *Deleted

## 2016-03-29 DIAGNOSIS — G459 Transient cerebral ischemic attack, unspecified: Secondary | ICD-10-CM | POA: Insufficient documentation

## 2016-03-29 DIAGNOSIS — H539 Unspecified visual disturbance: Secondary | ICD-10-CM | POA: Insufficient documentation

## 2016-03-29 DIAGNOSIS — G458 Other transient cerebral ischemic attacks and related syndromes: Secondary | ICD-10-CM

## 2016-03-29 DIAGNOSIS — I639 Cerebral infarction, unspecified: Secondary | ICD-10-CM

## 2016-03-29 DIAGNOSIS — I69398 Other sequelae of cerebral infarction: Secondary | ICD-10-CM | POA: Insufficient documentation

## 2016-03-29 DIAGNOSIS — F17200 Nicotine dependence, unspecified, uncomplicated: Secondary | ICD-10-CM | POA: Insufficient documentation

## 2016-03-29 HISTORY — PX: TEE WITHOUT CARDIOVERSION: SHX5443

## 2016-03-29 SURGERY — ECHOCARDIOGRAM, TRANSESOPHAGEAL
Anesthesia: Moderate Sedation

## 2016-03-29 MED ORDER — BUTAMBEN-TETRACAINE-BENZOCAINE 2-2-14 % EX AERO
INHALATION_SPRAY | CUTANEOUS | Status: DC | PRN
  Administered 2016-03-29: 2 via TOPICAL

## 2016-03-29 MED ORDER — SODIUM CHLORIDE 0.9 % IV SOLN
INTRAVENOUS | Status: DC
  Administered 2016-03-29: 500 mL via INTRAVENOUS

## 2016-03-29 MED ORDER — FENTANYL CITRATE (PF) 100 MCG/2ML IJ SOLN
INTRAMUSCULAR | Status: AC
  Filled 2016-03-29: qty 2

## 2016-03-29 MED ORDER — FENTANYL CITRATE (PF) 100 MCG/2ML IJ SOLN
INTRAMUSCULAR | Status: DC | PRN
  Administered 2016-03-29: 25 ug via INTRAVENOUS
  Administered 2016-03-29: 50 ug via INTRAVENOUS

## 2016-03-29 MED ORDER — MIDAZOLAM HCL 10 MG/2ML IJ SOLN
INTRAMUSCULAR | Status: DC | PRN
  Administered 2016-03-29: 1 mg via INTRAVENOUS
  Administered 2016-03-29: 2 mg via INTRAVENOUS
  Administered 2016-03-29 (×2): 1 mg via INTRAVENOUS

## 2016-03-29 MED ORDER — MIDAZOLAM HCL 5 MG/ML IJ SOLN
INTRAMUSCULAR | Status: AC
  Filled 2016-03-29: qty 2

## 2016-03-29 NOTE — CV Procedure (Signed)
    Transesophageal Echocardiogram Note  Erika Wong [id number] March 21, 1966  Procedure: Transesophageal Echocardiogram Indications: CVA  Procedure Details Consent: Obtained Time Out: Verified patient identification, verified procedure, site/side was marked, verified correct patient position, special equipment/implants available, Radiology Safety Procedures followed,  medications/allergies/relevent history reviewed, required imaging and test results available.  Performed  Medications:  During this procedure the patient is administered a total of Versed 5  mg and Fentanyl 75  mcg  to achieve and maintain moderate conscious sedation.  The patient's heart rate, blood pressure, and oxygen saturation are monitored continuously during the procedure. The period of conscious sedation is 30 inutes, of which I was present face-to-face 100% of this time.  Left Ventrical:  Normal LV function   Mitral Valve: trace MR  Aortic Valve: normal AV  Tricuspid Valve: normal   Pulmonic Valve: normal   Left Atrium/ Left atrial appendage: clean, no thrombi  Atrial septum: + PFO.   Significant right to left shunting through a moderate -large   Aorta: normal    Complications: No apparent complications Patient did tolerate procedure well.   Vesta MixerPhilip J. Jovany Disano, Montez HagemanJr., MD, [facility] FoundationFACC 03/29/2016, 11:13 AM

## 2016-03-29 NOTE — Interval H&P Note (Signed)
History and Physical Interval Note:  03/29/2016 10:58 AM  Erika GrandchildLeah Prochazka  has presented today for surgery, with the diagnosis of stroke  The various methods of treatment have been discussed with the patient and family. After consideration of risks, benefits and other options for treatment, the patient has consented to  Procedure(s): TRANSESOPHAGEAL ECHOCARDIOGRAM (TEE) (N/A) as a surgical intervention .  The patient's history has been reviewed, patient examined, no change in status, stable for surgery.  I have reviewed the patient's chart and labs.  Questions were answered to the patient's satisfaction.     Aina Rossbach, Deloris PingPhilip J

## 2016-03-29 NOTE — Discharge Instructions (Signed)
Moderate Conscious Sedation, Adult °Sedation is the use of medicines to promote relaxation and relieve discomfort and anxiety. Moderate conscious sedation is a type of sedation. Under moderate conscious sedation you are less alert than normal but are still able to respond to instructions or stimulation. Moderate conscious sedation is used during short medical and dental procedures. It is milder than deep sedation or general anesthesia and allows you to return to your regular activities sooner. °LET YOUR HEALTH CARE PROVIDER KNOW ABOUT:  °· Any allergies you have. °· All medicines you are taking, including vitamins, herbs, eye drops, creams, and over-the-counter medicines. °· Use of steroids (by mouth or creams). °· Previous problems you or members of your family have had with the use of anesthetics. °· Any blood disorders you have. °· Previous surgeries you have had. °· Medical conditions you have. °· Possibility of pregnancy, if this applies. °· Use of cigarettes, alcohol, or illegal drugs. °RISKS AND COMPLICATIONS °Generally, this is a safe procedure. However, as with any procedure, problems can occur. Possible problems include: °· Oversedation. °· Trouble breathing on your own. You may need to have a breathing tube until you are awake and breathing on your own. °· Allergic reaction to any of the medicines used for the procedure. °BEFORE THE PROCEDURE °· You may have blood tests done. These tests can help show how well your kidneys and liver are working. They can also show how well your blood clots. °· A physical exam will be done.   °· Only take medicines as directed by your health care provider. You may need to stop taking medicines (such as blood thinners, aspirin, or nonsteroidal anti-inflammatory drugs) before the procedure.   °· Do not eat or drink at least 6 hours before the procedure or as directed by your health care provider. °· Arrange for a responsible adult, family member, or friend to take you home  after the procedure. He or she should stay with you for at least 24 hours after the procedure, until the medicine has worn off. °PROCEDURE  °· An intravenous (IV) catheter will be inserted into one of your veins. Medicine will be able to flow directly into your body through this catheter. You may be given medicine through this tube to help prevent pain and help you relax. °· The medical or dental procedure will be done. °AFTER THE PROCEDURE °· You will stay in a recovery area until the medicine has worn off. Your blood pressure and pulse will be checked.   °·  Depending on the procedure you had, you may be allowed to go home when you can tolerate liquids and your pain is under control. °  °This information is not intended to replace advice given to you by your health care provider. Make sure you discuss any questions you have with your health care provider. °  °Document Released: 08/29/2001 Document Revised: 12/25/2014 Document Reviewed: 08/11/2013 °Elsevier Interactive Patient Education ©2016 [employer] ° °

## 2016-03-29 NOTE — H&P (View-Only) (Signed)
  Cardiology Office Note   Date:  03/24/2016   ID:  Erika Wong, DOB 10/31/1966, MRN [id number]  PCP:  No primary care provider on file.  Cardiologist:   Onika Gudiel, MD   Chief Complaint  Patient presents with  . Cerebrovascular Accident      History of Present Illness: Erika Wong is a 50 y.o. female who presents for evaluation after a CVA. She was in the hospital earlier this year. He did review these records. 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 This may be an incidental finding. 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 TTE was unremarkable. The patient has no past cardiac history. Particular she denies any palpitations, presyncope or syncope. She has no chest pressure, neck or arm discomfort.  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 With this she denies any cardiovascular symptoms. The stroke left her with some residual visual problems.  Past Medical History  Diagnosis Date  . Asthma   . Tobacco abuse   . Stroke (HCC)     Past Surgical History  Procedure Laterality Date  . Tubal ligation       Current Outpatient Prescriptions  Medication Sig Dispense Refill  . aspirin 325 MG tablet Take 1 tablet (325 mg total) by mouth daily. 30 tablet 0  . atorvastatin (LIPITOR) 10 MG tablet Take 1 tablet (10 mg total) by mouth daily at 6 PM. 30 tablet 0  . varenicline (CHANTIX CONTINUING MONTH PAK) 1 MG tablet Take 1 tablet (1 mg total) by mouth 2 (two) times daily. 60 tablet 1  . varenicline (CHANTIX STARTING MONTH PAK) 0.5 MG X 11 & 1 MG X 42 tablet Take one 0.5 mg tablet by mouth once daily for 3 days, then increase to one 0.5 mg tablet twice daily for 4 days, then increase to one 1 mg tablet twice daily. 53 tablet 0   No current facility-administered medications for this visit.    Allergies:   Hydrocodone    Social History:  The patient  reports that she has been  smoking Cigarettes.  She has a 35 pack-year smoking history. She does not have any smokeless tobacco history on file. She reports that she drinks alcohol. She reports that she does not use illicit drugs.   Family History:  The patient's family history includes Asthma in her brother, father, and sister; COPD in her father; Congestive Heart Failure in her mother; Diabetes in her mother and sister; Heart attack (age of onset: 52) in her paternal grandmother.    ROS:  Please see the history of present illness.   Otherwise, review of systems are positive for none.   All other systems are reviewed and negative.    PHYSICAL EXAM: VS:  BP 124/84 mmHg  Pulse 87  Ht 5' 3" (1.6 m)  Wt 256 lb 3.2 oz (116.212 kg)  BMI 45.40 kg/m2  LMP 03/09/2016 , BMI Body mass index is 45.4 kg/(m^2). GENERAL:  Well appearing HEENT:  Pupils equal round and reactive, fundi not visualized, oral mucosa unremarkable NECK:  No jugular venous distention, waveform within normal limits, carotid upstroke brisk and symmetric, no bruits, no thyromegaly LYMPHATICS:  No cervical, inguinal adenopathy LUNGS:  Clear to auscultation bilaterally BACK:  No CVA tenderness CHEST:  Unremarkable HEART:  PMI not displaced or sustained,S1 and S2 within normal limits, no S3, no S4, no clicks, no rubs,   no murmurs ABD:  Flat, positive bowel sounds normal in frequency in pitch, no bruits, no rebound, no guarding, no midline pulsatile mass, no hepatomegaly, no splenomegaly EXT:  2 plus pulses throughout, no edema, no cyanosis no clubbing SKIN:  No rashes no nodules NEURO:  Cranial nerves II through XII grossly intact, motor grossly intact throughout PSYCH:  Cognitively intact, oriented to person place and time    EKG:  EKG is not ordered today. The ekg ordered 3/23/17demonstrates sinus rhythm, rate 75, axis within normal limits, intervals within normal limits, no acute ST-T wave changes area   Recent Labs: 03/09/2016: ALT 36; BUN 9;  Creatinine, Ser 0.80; Hemoglobin 16.3*; Platelets 182; Potassium 4.2; Sodium 142    Lipid Panel    Component Value Date/Time   CHOL 146 03/10/2016 0527   TRIG 147 03/10/2016 0527   HDL 39* 03/10/2016 0527   CHOLHDL 3.7 03/10/2016 0527   VLDL 29 03/10/2016 0527   LDLCALC 78 03/10/2016 0527      Wt Readings from Last 3 Encounters:  03/24/16 256 lb 3.2 oz (116.212 kg)  03/09/16 251 lb (113.853 kg)  10/09/12 250 lb (113.399 kg)      Other studies Reviewed: Additional studies/ records that were reviewed today include: Hospital records. Review of the above records demonstrates:  Please see elsewhere in the note.     ASSESSMENT AND PLAN:  CVA:  I will start with a 21 day event monitor but she'll likely need an implanted loop. I will also order a TEE to look for embolic source.  TOBACCO: She does want to try Chantix.  We discussed all of the potential side effects and in particular I reviewed the Black Box Warning.  He has no depression.  He understands and wishes to try this medication.  OBESITY:  The patient understands the need to lose weight with diet and exercise. We have discussed specific strategies for this.    Current medicines are reviewed at length with the patient today.  The patient does not have concerns regarding medicines.  The following changes have been made:    As above  Labs/ tests ordered today include:   Orders Placed This Encounter  Procedures  . Cardiac event monitor  . TRANSESOPHAGEAL ECHOCARDIOGRAM W/O CARDIOVERSION     Disposition:   FU with me after the event monitor.      Signed, Altan Kraai, MD  03/24/2016 8:58 AM    [facility]     

## 2016-03-29 NOTE — Interval H&P Note (Signed)
History and Physical Interval Note:  03/29/2016 8:59 AM  Esaw GrandchildLeah Bambach  has presented today for surgery, with the diagnosis of stroke  The various methods of treatment have been discussed with the patient and family. After consideration of risks, benefits and other options for treatment, the patient has consented to  Procedure(s): TRANSESOPHAGEAL ECHOCARDIOGRAM (TEE) (N/A) as a surgical intervention .  The patient's history has been reviewed, patient examined, no change in status, stable for surgery.  I have reviewed the patient's chart and labs.  Questions were answered to the patient's satisfaction.     Suha Schoenbeck, Deloris PingPhilip J

## 2016-03-30 ENCOUNTER — Telehealth: Payer: Self-pay | Admitting: *Deleted

## 2016-03-30 ENCOUNTER — Encounter: Payer: Self-pay | Admitting: *Deleted

## 2016-03-30 ENCOUNTER — Ambulatory Visit: Payer: Self-pay

## 2016-03-30 ENCOUNTER — Encounter (HOSPITAL_COMMUNITY): Payer: Self-pay | Admitting: Cardiovascular Disease

## 2016-03-30 DIAGNOSIS — G458 Other transient cerebral ischemic attacks and related syndromes: Secondary | ICD-10-CM

## 2016-03-30 DIAGNOSIS — Q2112 Patent foramen ovale: Secondary | ICD-10-CM

## 2016-03-30 DIAGNOSIS — Q211 Atrial septal defect: Secondary | ICD-10-CM

## 2016-03-30 NOTE — Progress Notes (Signed)
Patient ID: [name], female   DOB: May 24, 1966, 50 y.o.   MRN: [id number] Patient given paperwork to apply for the hardship program through [facility] for a cardiac event monitor.  Patient states she will return paperwork to [facility], Monday, 05/03/2016.  If approved for hardship program, patient will need to be scheduled to have monitor applied in our office.  Patient does not have a landline phone.

## 2016-03-30 NOTE — Telephone Encounter (Signed)
-----   Message from Rollene RotundaJames Hochrein, MD sent at 03/30/2016  1:55 PM EDT ----- Echo does not show a source of clot.  There is a PFO.  This is an incidental finding.  However, we need to order bilateral lower extremity venous Dopplers to make sure she has no thrombosis.   Call Ms. Hillmer with the results and send results to [facility],[place] M, FNP

## 2016-03-31 ENCOUNTER — Telehealth: Payer: Self-pay | Admitting: *Deleted

## 2016-03-31 NOTE — Telephone Encounter (Signed)
Spoke with pt as to why doppler was order

## 2016-03-31 NOTE — Telephone Encounter (Signed)
Patient wants to know why she needs this doppler

## 2016-03-31 NOTE — Telephone Encounter (Signed)
Follow Up   Scheduled the Lower venous doppler. Pt req a call back to discuss why it is needed. Please cal

## 2016-03-31 NOTE — Telephone Encounter (Signed)
Left message for patient to call and schedule lower venous doppler

## 2016-04-07 ENCOUNTER — Inpatient Hospital Stay (HOSPITAL_COMMUNITY): Admission: RE | Admit: 2016-04-07 | Payer: Self-pay | Source: Ambulatory Visit

## 2016-04-12 ENCOUNTER — Encounter: Payer: Self-pay | Admitting: *Deleted

## 2016-04-12 NOTE — Progress Notes (Signed)
Patient ID: [name], female   DOB: 1966-07-31, 50 y.o.   MRN: [id number] Patient enrolled with [employer] for a cardiac event monitor pending approval of [employer] hardship program.  Patient will be scheduled to have the monitor applied in our office if hardship approved.

## 2016-04-18 ENCOUNTER — Encounter (HOSPITAL_COMMUNITY): Payer: Self-pay | Admitting: Cardiovascular Disease

## 2016-04-19 ENCOUNTER — Ambulatory Visit (INDEPENDENT_AMBULATORY_CARE_PROVIDER_SITE_OTHER): Payer: Self-pay

## 2016-04-19 DIAGNOSIS — G459 Transient cerebral ischemic attack, unspecified: Secondary | ICD-10-CM

## 2016-04-20 ENCOUNTER — Other Ambulatory Visit (HOSPITAL_COMMUNITY): Payer: Self-pay | Admitting: Neurosurgery

## 2016-04-20 ENCOUNTER — Ambulatory Visit (HOSPITAL_COMMUNITY)
Admission: RE | Admit: 2016-04-20 | Discharge: 2016-04-20 | Disposition: A | Discharge status: Home / Self Care | Payer: Self-pay | Source: Ambulatory Visit | Attending: Neurosurgery | Admitting: Neurosurgery

## 2016-04-20 DIAGNOSIS — J45909 Unspecified asthma, uncomplicated: Secondary | ICD-10-CM | POA: Insufficient documentation

## 2016-04-20 DIAGNOSIS — Z7982 Long term (current) use of aspirin: Secondary | ICD-10-CM | POA: Insufficient documentation

## 2016-04-20 DIAGNOSIS — I671 Cerebral aneurysm, nonruptured: Secondary | ICD-10-CM | POA: Insufficient documentation

## 2016-04-20 DIAGNOSIS — I729 Aneurysm of unspecified site: Secondary | ICD-10-CM

## 2016-04-20 DIAGNOSIS — F17210 Nicotine dependence, cigarettes, uncomplicated: Secondary | ICD-10-CM | POA: Insufficient documentation

## 2016-04-20 DIAGNOSIS — Z885 Allergy status to narcotic agent status: Secondary | ICD-10-CM | POA: Insufficient documentation

## 2016-04-20 DIAGNOSIS — Z79899 Other long term (current) drug therapy: Secondary | ICD-10-CM | POA: Insufficient documentation

## 2016-04-20 DIAGNOSIS — Z8673 Personal history of transient ischemic attack (TIA), and cerebral infarction without residual deficits: Secondary | ICD-10-CM | POA: Insufficient documentation

## 2016-04-20 HISTORY — PX: IR GENERIC HISTORICAL: IMG1180011

## 2016-04-20 LAB — BASIC METABOLIC PANEL
ANION GAP: 10 (ref 5–15)
BUN: 14 mg/dL (ref 6–20)
CO2: 23 mmol/L (ref 22–32)
Calcium: 9.1 mg/dL (ref 8.9–10.3)
Chloride: 107 mmol/L (ref 101–111)
Creatinine, Ser: 0.81 mg/dL (ref 0.44–1.00)
GFR calc Af Amer: >60 mL/min (ref >60)
GFR calc non Af Amer: >60 mL/min (ref >60)
GLUCOSE: 127 mg/dL — AB (ref 65–99)
POTASSIUM: 3.8 mmol/L (ref 3.5–5.1)
Sodium: 140 mmol/L (ref 135–145)

## 2016-04-20 LAB — CBC WITH DIFFERENTIAL/PLATELET
BASOS ABS: 0 10*3/uL (ref 0.0–0.1)
Basophils Relative: 0 %
Eosinophils Absolute: 0.2 10*3/uL (ref 0.0–0.7)
Eosinophils Relative: 3 %
HEMATOCRIT: 42.2 % (ref 36.0–46.0)
Hemoglobin: 13.9 g/dL (ref 12.0–15.0)
LYMPHS PCT: 62 %
Lymphs Abs: 4.1 10*3/uL — ABNORMAL HIGH (ref 0.7–4.0)
MCH: 30.4 pg (ref 26.0–34.0)
MCHC: 32.9 g/dL (ref 30.0–36.0)
MCV: 92.3 fL (ref 78.0–100.0)
MONO ABS: 0.4 10*3/uL (ref 0.1–1.0)
MONOS PCT: 6 %
NEUTROS ABS: 2 10*3/uL (ref 1.7–7.7)
Neutrophils Relative %: 29 %
Platelets: 198 10*3/uL (ref 150–400)
RBC: 4.57 MIL/uL (ref 3.87–5.11)
RDW: 14.6 % (ref 11.5–15.5)
WBC: 6.7 10*3/uL (ref 4.0–10.5)

## 2016-04-20 LAB — PROTIME-INR
INR: 1.1 (ref 0.00–1.49)
Prothrombin Time: 14.4 seconds (ref 11.6–15.2)

## 2016-04-20 LAB — APTT: aPTT: 39 seconds — ABNORMAL HIGH (ref 24–37)

## 2016-04-20 MED ORDER — MIDAZOLAM HCL 2 MG/2ML IJ SOLN
INTRAMUSCULAR | Status: AC | PRN
  Administered 2016-04-20: 1 mg via INTRAVENOUS

## 2016-04-20 MED ORDER — SODIUM CHLORIDE 0.9 % IV SOLN: INTRAVENOUS | Status: DC

## 2016-04-20 MED ORDER — FENTANYL CITRATE (PF) 100 MCG/2ML IJ SOLN
INTRAMUSCULAR | Status: AC | PRN
  Administered 2016-04-20: 25 ug via INTRAVENOUS

## 2016-04-20 MED ORDER — FENTANYL CITRATE (PF) 100 MCG/2ML IJ SOLN
INTRAMUSCULAR | Status: AC
  Filled 2016-04-20: qty 2

## 2016-04-20 MED ORDER — LIDOCAINE HCL 1 % IJ SOLN
INTRAMUSCULAR | Status: AC
  Administered 2016-04-20: 10 mL
  Filled 2016-04-20: qty 20

## 2016-04-20 MED ORDER — HEPARIN SODIUM (PORCINE) 1000 UNIT/ML IJ SOLN
INTRAMUSCULAR | Status: AC
  Filled 2016-04-20: qty 2

## 2016-04-20 MED ORDER — IOPAMIDOL (ISOVUE-300) INJECTION 61%
INTRAVENOUS | Status: AC
  Administered 2016-04-20: 60 mL
  Filled 2016-04-20: qty 150

## 2016-04-20 MED ORDER — SODIUM CHLORIDE 0.9 % IV SOLN
INTRAVENOUS | Status: AC | PRN
  Administered 2016-04-20: 10 mL/h via INTRAVENOUS

## 2016-04-20 MED ORDER — HEPARIN SODIUM (PORCINE) 1000 UNIT/ML IJ SOLN
INTRAMUSCULAR | Status: AC | PRN
  Administered 2016-04-20: 2000 [IU] via INTRAVENOUS

## 2016-04-20 MED ORDER — MIDAZOLAM HCL 2 MG/2ML IJ SOLN
INTRAMUSCULAR | Status: AC
  Filled 2016-04-20: qty 2

## 2016-04-20 MED ORDER — IOPAMIDOL (ISOVUE-300) INJECTION 61%
INTRAVENOUS | Status: AC
  Administered 2016-04-20: 21 mL
  Filled 2016-04-20: qty 50

## 2016-04-20 NOTE — Progress Notes (Signed)
Cardiology Office Note   Date:  04/21/2016   ID:  Erika Wong, DOB 1966/10/01, MRN [id number]  PCP:  Massie Maroon, FNP  Cardiologist:   Rollene Rotunda, MD   Chief Complaint  Patient presents with  . Follow-up    FOLLOW UP AFTER MONITOR      History of Present Illness: Erika Wong is a 50 y.o. female who presents for evaluation after a CVA. She was in the hospital earlier this year. He did review these records. 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 This may be an incidental finding. She had an angiogram yesterday and is awaiting the plan for treatment.  Prior to the last appt she had had an ischemic right cerebellar infarct thought to be embolic. TTE was unremarkable. The patient has no past cardiac history. She now has no residua from the stroke although initially she had visual changes.  Since I last saw her she had a TEE without source of clot.  There was a PFO.  She is wearing an event monitor. She was supposed to have lower extremity venous Doppler and this has not yet happened.    The patient denies any new symptoms such as chest discomfort, neck or arm discomfort. There has been no new shortness of breath, PND or orthopnea. There have been no reported palpitations, presyncope or syncope.  She is walking most days.   Past Medical History  Diagnosis Date  . Asthma   . Tobacco abuse   . Stroke [facility])     Past Surgical History  Procedure Laterality Date  . Tubal ligation    . Tee without cardioversion N/A 03/29/2016    Procedure: TRANSESOPHAGEAL ECHOCARDIOGRAM (TEE);  Surgeon: [name], MD;  Location: [facility] ENDOSCOPY;  Service: Cardiovascular;  Laterality: N/A;     Current Outpatient Prescriptions  Medication Sig Dispense Refill  . aspirin 325 MG tablet Take 1 tablet (325 mg total) by mouth daily. 30 tablet 0  . varenicline (CHANTIX CONTINUING MONTH PAK) 1 MG tablet Take 1 tablet (1 mg total) by mouth 2 (two) times daily. 60 tablet 1  . varenicline  (CHANTIX STARTING MONTH PAK) 0.5 MG X 11 & 1 MG X 42 tablet Take one 0.5 mg tablet by mouth once daily for 3 days, then increase to one 0.5 mg tablet twice daily for 4 days, then increase to one 1 mg tablet twice daily. 53 tablet 0   No current facility-administered medications for this visit.    Allergies:   Hydrocodone    ROS:  Please see the history of present illness.   Otherwise, review of systems are positive for none.   All other systems are reviewed and negative.    PHYSICAL EXAM: VS:  BP 116/78 mmHg  Pulse 86  Ht  (1.6 m)  Wt 252 lb (114.306 kg)  BMI 44.65 kg/m2  LMP  (Within Years) , BMI Body mass index is 44.65 kg/(m^2). GENERAL:  Well appearing NECK:  No jugular venous distention, waveform within normal limits, carotid upstroke brisk and symmetric, no bruits, no thyromegaly LYMPHATICS:  No cervical, inguinal adenopathy LUNGS:  Clear to auscultation bilaterally CHEST:  Unremarkable HEART:  PMI not displaced or sustained,S1 and S2 within normal limits, no S3, no S4, no clicks, no rubs, no murmurs ABD:  Flat, positive bowel sounds normal in frequency in pitch, no bruits, no rebound, no guarding, no midline pulsatile mass, no hepatomegaly, no splenomegaly EXT:  2 plus pulses throughout,  no edema, no cyanosis no clubbing   EKG:  EKG is not ordered today.   Recent Labs: 03/27/2016: ALT 23; TSH 0.50 04/20/2016: BUN 14; Creatinine, Ser 0.81; Hemoglobin 13.9; Platelets 198; Potassium 3.8; Sodium 140    Lipid Panel    Component Value Date/Time   CHOL 146 03/10/2016 0527   TRIG 147 03/10/2016 0527   HDL 39* 03/10/2016 0527   CHOLHDL 3.7 03/10/2016 0527   VLDL 29 03/10/2016 0527   LDLCALC 78 03/10/2016 0527      Wt Readings from Last 3 Encounters:  04/21/16 252 lb (114.306 kg)  04/20/16 250 lb (113.399 kg)  03/27/16 254 lb (115.214 kg)      Other studies Reviewed: Additional studies/ records that were reviewed today include: None    ASSESSMENT AND  PLAN:  CVA:  She will continue to wear the event monitor and I will get the venous Dopplers to make sure there is no evidence of DVT. No change in therapy otherwise is suggested.   TOBACCO: She does not want to try Chantix.  We talked about this.  She is weaning down but has a quit date.   OBESITY:  The patient understands the need to lose weight with diet and exercise. We have discussed specific strategies for this.    Current medicines are reviewed at length with the patient today.  The patient does not have concerns regarding medicines.  The following changes have been made:    As above  Labs/ tests ordered today include:   No orders of the defined types were placed in this encounter.     Disposition:   FU with me after the event monitor.      Signed, Rollene RotundaJames Tenoch Mcclure, MD  04/21/2016 4:11 PM    [facility]

## 2016-04-20 NOTE — Discharge Instructions (Signed)
Cerebral Angiogram Cerebral angiogram, also called cerebral angiography, is an imaging test. It uses X-ray or digital images and colored dye (iodine solution) that is called contrast. An angiogram is done to look at blood vessels in the brain and neck. It helps to detect any abnormalities in the vessels that might affect blood flow. During the procedure, the physician will inject medicine into an area in your groin or arm to numb it. The physician will make a tiny incision in the groin or arm. A thin tube (catheter) will be slipped into an artery and moved to the brain or neck. The contrast will then be injected into the catheter and images will be taken of the area.  LET [facility] CARE PROVIDER KNOW ABOUT:  Any allergies you have, particularly shellfish.  All medicines you are taking, including vitamins, herbs, eye drops, creams, and over-the-counter medicines.  Previous problems you or members of your family have had with the use of anesthetics.  Any reactions you have had to contrast dye or iodine.  Any blood disorders you have.   Previous surgeries you have had.   Medical conditions you have, including pregnancy or the possibility of pregnancy.   If you are currently breastfeeding.  RISKS AND COMPLICATIONS Generally, this is a safe procedure. However, problems can occur and include:  Allergic reaction to contrast material or anesthesia.  Damage to surrounding nerves, tissues, or structures.  Bleeding or bruising.  Blood clot.  Infection.  Inability to remember what happened (amnesia) (usually temporary).  Weakness, numbness, speech, or vision problems (usually temporary).  Stroke.  Kidney injury. BEFORE THE PROCEDURE  You may have:  A physical exam.  Blood and urine tests.  Imaging tests, including X-rays or MRIs.  Ask your health care provider about:  Changing or stopping your regular medicines. This is especially important if you are taking diabetes  medicines or blood thinners.  Taking medicines such as aspirin or ibuprofen. These medicines can thin your blood. Do not take these medicines before your procedure if your health care provider asks you not to.  Do not eat or drink anything after midnight on the night before the procedure or as directed by your health care provider. PROCEDURE   You will lie on your back on an imaging bed with a C-shaped machine around you.  Your head will be secured to the bed with a strap or device to help you keep still.  You will be given medicine to help you relax (sedative) through an IV in your arm.  Your heart rate and other vital signals will be watched carefully.  Cleaning and numbing medicine (local anesthetic) will be applied to your skin where the insertion will take place. This is usually your groin, leg, or arm.  The radiologist will make a small incision.  The catheter will be inserted into an artery that leads to the head. You may feel slight pressure.  The catheter will be moved through the body all the way up to your neck and brain. Images will help your health care provider bring the catheter to the correct location.  The dye will be injected into the catheter and will travel to your head or neck area. You may feel a warming or burning sensation or notice a strange taste as the dye goes through your system.  Images will be taken of how the dye flows through the area. It is normal to hear beeping noises and machines during the procedure.  While the images are  being taken, you may be given instructions on breathing, swallowing, moving, or talking.  When the images are finished, the catheter will be slowly removed.  Pressure will be applied to the skin to stop any bleeding. A tight bandage (dressing) or seal will be applied to the skin.  Your IV line will be removed. AFTER THE PROCEDURE   You will stay in a recovery area until the medicine has worn off. Your blood pressure and pulse  will be observed.  You will be asked to lie flat and to keep your arm or leg straight in the recovery room.  The insertion site will be watched for bleeding and you will be checked often.  You may feel tired.  You may feel tenderness or notice bruising at the insertion site.   This information is not intended to replace advice given to you by your health care provider. Make sure you discuss any questions you have with your health care provider.   Document Released: 04/20/2014 Document Reviewed: 04/20/2014 Elsevier Interactive Patient Education 2016 Elsevier Inc. Cerebral Angiogram, Care After Refer to this sheet in the next few weeks. These instructions provide you with information on caring for yourself after your procedure. Your health care provider may also give you more specific instructions. Your treatment has been planned according to current medical practices, but problems sometimes occur. Call your health care provider if you have any problems or questions after your procedure. WHAT TO EXPECT AFTER THE PROCEDURE After your procedure, it is typical to have the following:  Bruising at the catheter insertion site that usually fades within 1-2 weeks.  Blood collecting in the tissue (hematoma) that may be painful to the touch. It should usually decrease in size and tenderness within 1-2 weeks.  A mild headache. HOME CARE INSTRUCTIONS  Take medicines only as directed by your health care provider.  You may shower 24-48 hours after the procedure or as directed by your health care provider. Remove the bandage (dressing) and gently wash the site with plain soap and water. Pat the area dry with a clean towel. Do not rub the site, because this may cause bleeding.  Do not take baths, swim, or use a hot tub until your health care provider approves.  Check your insertion site every day for redness, swelling, or drainage.  Do not apply powder or lotion to the site.  Do not lift over 10  lb (4.5 kg) for 5 days after your procedure or as directed by your health care provider.  Ask your health care provider when it is okay to:  Return to work or school.  Resume usual physical activities or sports.  Resume sexual activity.  Do not drive home if you are discharged the same day as the procedure. Have someone else drive you.  You may drive 24 hours after the procedure unless otherwise instructed by your health care provider.  Do not operate machinery or power tools for 24 hours after the procedure or as directed by your health care provider.  If your procedure was done as an outpatient procedure, which means that you went home the same day as your procedure, a responsible adult should be with you for the first 24 hours after you arrive home.  Keep all follow-up visits as directed by your health care provider. This is important. SEEK MEDICAL CARE IF:  You have a fever.  You have chills.  You have increased bleeding from the catheter insertion site. Hold pressure on the site. SEEK  IMMEDIATE MEDICAL CARE IF:  You have vision changes or loss of vision.  You have numbness or weakness on one side of your body.  You have difficulty talking, or you have slurred speech or cannot speak (aphasia).  You feel confused or have difficulty remembering.  You have unusual pain at the catheter insertion site.  You have redness, warmth, or swelling at the catheter insertion site.  You have drainage (other than a small amount of blood on the dressing) from the catheter insertion site.  The catheter insertion site is bleeding, and the bleeding does not stop after 30 minutes of holding steady pressure on the site. These symptoms may represent a serious problem that is an emergency. Do not wait to see if the symptoms will go away. Get medical help right away. Call your local emergency services (911 in [place] Do not drive yourself to the hospital.   This information is not intended to  replace advice given to you by your health care provider. Make sure you discuss any questions you have with your health care provider.   Document Released: 04/20/2014 Document Revised: 09/22/2014 Document Reviewed: 04/20/2014 Elsevier Interactive Patient Education [employer]

## 2016-04-20 NOTE — H&P (Signed)
CC:  Aneurysm  HPI: Erika Wong is a 50 y.o. female recently admitted to the hospital with transient right arm and leg weakness which resolved. MRI/MRA were done as part of the workup, demonstrated an approximately 10mm left Pcom aneurysm. After recover from the TIA, she was seen in the outpatient clinic and now presents for diagnostic angiogram for further workup.  PMH: Past Medical History  Diagnosis Date  . Asthma   . Tobacco abuse   . Stroke [facility](HCC)     PSH: Past Surgical History  Procedure Laterality Date  . Tubal ligation    . Tee without cardioversion N/A 03/29/2016    Procedure: TRANSESOPHAGEAL ECHOCARDIOGRAM (TEE);  Surgeon: Vesta MixerPhilip J Nahser, MD;  [facility];  Service: Cardiovascular;  Laterality: N/A;    SH: Social History  Substance Use Topics  . Smoking status: Current Every Day Smoker -- 0.50 packs/day for 35 years    Types: Cigarettes  . Smokeless tobacco: Never Used  . Alcohol Use: No     Comment: occ    MEDS: Prior to Admission medications   Medication Sig Start Date End Date Taking? Authorizing Provider  aspirin 325 MG tablet Take 1 tablet (325 mg total) by mouth daily. 03/10/16  Yes Nishant Dhungel, MD  atorvastatin (LIPITOR) 10 MG tablet Take 1 tablet (10 mg total) by mouth daily at 6 PM. 03/10/16  Yes Nishant Dhungel, MD  varenicline (CHANTIX CONTINUING MONTH PAK) 1 MG tablet Take 1 tablet (1 mg total) by mouth 2 (two) times daily. Patient not taking: Reported on 03/27/2016 03/24/16   Rollene RotundaJames Hochrein, MD  varenicline (CHANTIX STARTING MONTH PAK) 0.5 MG X 11 & 1 MG X 42 tablet Take one 0.5 mg tablet by mouth once daily for 3 days, then increase to one 0.5 mg tablet twice daily for 4 days, then increase to one 1 mg tablet twice daily. Patient not taking: Reported on 03/27/2016 03/24/16   Rollene RotundaJames Hochrein, MD    ALLERGY: Allergies  Allergen Reactions  . Hydrocodone Other (See Comments)    "Makes me feel funny"    ROS: ROS  NEUROLOGIC EXAM: Awake,  alert, oriented Memory and concentration grossly intact Speech fluent, appropriate CN grossly intact Motor exam: Upper Extremities Deltoid Bicep Tricep Grip  Right 5/5 5/5 5/5 5/5  Left 5/5 5/5 5/5 5/5   Lower Extremity IP Quad PF DF EHL  Right 5/5 5/5 5/5 5/5 5/5  Left 5/5 5/5 5/5 5/5 5/5   Sensation grossly intact to LT  IMGAING: MRA demonstrates posteriorly projecting ~2610mm left Pcom aneurysm  IMPRESSION: - 50 y.o. female with likely incidental, although cannot completely r/o aneurysm as source of emboli, 10mm left Pcom  PLAN: - Proceed with diagnotic cerebral angiogram.  I have reviewed the procedure, indications, risks, and benefits with the patient in the office. All questions were answered and consent was obtained.

## 2016-04-20 NOTE — Sedation Documentation (Signed)
5 Fr. Exoseal to right groin 

## 2016-04-20 NOTE — Op Note (Addendum)
DIAGNOSTIC CEREBRAL ANGIOGRAM    OPERATOR:   Dr. Lisbeth Renshaw, MD  HISTORY:   The patient is a 50 y.o. yo female with a history of TIA a few weeks ago with right arm and leg numbness and weakness. This resolved spontaneously, however workup included MRA which demonstrated a large left Pcom aneurysm. She presents for further workup with diagnostic cerebral angiogram.  APPROACH:   The technical aspects of the procedure as well as its potential risks and benefits were reviewed with the patient. These risks included but were not limited bleeding, infection, allergic reaction, damage to organs/vital structures, stroke, non-diagnostic procedure, and the catastrophic outcomes of heart attack, coma, and death. With an understanding of these risks, informed consent was obtained and witnessed.    The patient was placed in the supine position on the angiography table and the skin of right groin prepped in the usual sterile fashion. The procedure was performed under local anesthesia (1%-solution of bicarbonate-bufferred Lidoacaine) and conscious sedation with Versed and fentanyl monitored by the in-suite nurse.    A 5- French sheath was introduced in the right common femoral artery using Seldinger technique.  A fluorophase sequence was used to document the sheath position.    HEPARIN: 2000 Units total.   CONTRAST AGENT: 80cc, Omnipaque 300   FLUOROSCOPY TIME: See IR records    CATHETER(S) AND WIRE(S):    5-French JB-1 glidecatheter   5-French Gwyneth Revels catheter 5-French Simmons-2 glidecatheter 0.035" glidewire    VESSELS CATHETERIZED:   Right internal carotid   Left internal carotid   Right vertebral   Left vertebral   Right common femoral  VESSELS STUDIED:   Right common carotid, neck Right internal carotid, head Right internal carotid, 3D rotational angiogram Right vertebral Left common carotid, head Left vertebral Right femoral  PROCEDURAL NARRATIVE:   A 5-Fr JB-1 terumo glide  catheter was advanced over a 0.035 glidewire into the aortic arch. The right vertebral, carotid, and left vertebral vessels were then sequentially catheterized and cervical/cerebral angiograms taken. After review of images, the catheter was removed without incident.  The left internal was catheterized with the Simmons-2 and cerebral angiograms taken. The catheter was then removed without incident.  INTERPRETATION:   Right common carotid: neck:   There is no significant stenosis, occlusion, aneurysm or plaque visualized on this injection.    Right internal carotid: head:   Injection reveals the presence of a widely patent ICA, M1, and A1 segments and their branches. There is no significant stenosis, occlusion, aneurysm or high flow vascular malformation visualized.  There is a large right posterior communicating artery with infundibular origin without true aneurysm. The parenchymal and venous phases are normal. The venous sinuses are widely patent.    Left internal carotid: head:   Injection reveals the presence of a widely patent ICA, A1, and M1 segments and their branches. There is a large posteriorly directed bilobed aneurysm of the posterior communicating artery. This measures 10.9 x 7.1 x 8.57mm in maximal dimension. The parenchymal and venous phases are normal. The venous sinuses are widely patent.    Left internal carotid, 3D rotation: Using a separate and distinct workstation at a distinct time from any previous procedures, CT angiographic images were reviewed. This included multiplanar and 3D reformatted images. This further delineates the above described posterior communicating artery aneurysm. Of note, the aneurysm neck occurs from the left Pcom proper, and not from the internal carotid artery.  Left vertebral:   Injection reveals the presence of a widely  patent vertebral artery. This leads to a widely patent basilar artery that terminates in bilateral P1. The basilar apex is patulous, but  without aneurysm. There is no significant stenosis, occlusion, aneurysm, or vascular malformation visualized. The parenchymal and venous phases are normal. The venous sinuses are widely patent.    Right vertebral:    Normal vessel. No PICA aneurysm. See basilar description above.    Right femoral:    Normal vessel. No significant atherosclerotic disease. Arterial sheath in adequate position.   DISPOSITION:  Upon completion of the study, the femoral sheath was removed and hemostasis obtained using a 5-Fr ExoSeal closure device. Good proximal and distal lower extremity pulses were documented upon achievement of hemostasis.    The procedure was well tolerated and no early complications were observed.       The patient was transferred back to the holding area to be positioned flat in bed for 3 hours of observation.    IMPRESSION:  1. 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  The preliminary results of this procedure were shared with the patient and the patient's family.

## 2016-04-21 ENCOUNTER — Ambulatory Visit (INDEPENDENT_AMBULATORY_CARE_PROVIDER_SITE_OTHER): Payer: Self-pay | Admitting: Cardiology

## 2016-04-21 ENCOUNTER — Encounter: Payer: Self-pay | Admitting: Cardiology

## 2016-04-21 VITALS — BP 116/78 | HR 86 | Ht 63.0 in | Wt 252.0 lb

## 2016-04-21 DIAGNOSIS — I639 Cerebral infarction, unspecified: Secondary | ICD-10-CM

## 2016-04-21 NOTE — Patient Instructions (Signed)
Your physician wants you to follow-up in: 6 Months You will receive a reminder letter in the mail two months in advance. If you don't receive a letter, please call our office to schedule the follow-up appointment.  

## 2016-04-23 DIAGNOSIS — I639 Cerebral infarction, unspecified: Secondary | ICD-10-CM | POA: Insufficient documentation

## 2016-04-23 HISTORY — DX: Cerebral infarction, unspecified: I63.9

## 2016-05-01 ENCOUNTER — Ambulatory Visit (HOSPITAL_COMMUNITY)
Admission: RE | Admit: 2016-05-01 | Discharge: 2016-05-01 | Disposition: A | Discharge status: Home / Self Care | Payer: Self-pay | Source: Ambulatory Visit | Attending: Cardiology | Admitting: Cardiology

## 2016-05-01 DIAGNOSIS — G458 Other transient cerebral ischemic attacks and related syndromes: Secondary | ICD-10-CM

## 2016-05-01 DIAGNOSIS — Q211 Atrial septal defect: Secondary | ICD-10-CM

## 2016-05-01 DIAGNOSIS — Z720 Tobacco use: Secondary | ICD-10-CM | POA: Insufficient documentation

## 2016-05-01 DIAGNOSIS — Q2112 Patent foramen ovale: Secondary | ICD-10-CM

## 2016-05-03 ENCOUNTER — Other Ambulatory Visit: Payer: Self-pay | Admitting: Neurosurgery

## 2016-05-30 NOTE — Pre-Procedure Instructions (Addendum)
Erika GrandchildLeah Arbuthnot  05/30/2016      [facility] - [place] ([place]), [place] - [address] [place] ([place]) [place] [id number] Phone: [phone] Fax: [phone]    Your procedure is scheduled on Friday, June 23rd, 2017.  [facility] Ad[facility] at 5:30 A.M.   Call this number if you have problems the morning of surgery:  [phone]   Remember:  Do not eat food or drink liquids after midnight.  Take these medicines the morning of surgery with A SIP OF WATER none  STOP all herbel meds, nsaids (aleve,naproxen,advil,ibuprofen) 5 days prior to surgery including  vitamins     Continue aspirin until told otherwise except do not take morning of surgery/ take monitor back to dr    Do not wear jewelry, make-up or nail polish.  Do not wear lotions, powders, or perfumes.    Do not shave 48 hours prior to surgery.    Do not bring valuables to the hospital.   [facility] is not responsible for any belongings or valuables.  Contacts, dentures or bridgework may not be worn into surgery.  Leave your suitcase in the car.  After surgery it may be brought to your room.  For patients admitted to the hospital, discharge time will be determined by your treatment team.  Patients discharged the day of surgery will not be allowed to drive home.   Special instructions: Special Instructions: [facility] - Preparing for Surgery  Before surgery, you can play an important role.  Because skin is not sterile, your skin needs to be as free of germs as possible.  You can reduce the number of germs on you skin by washing with CHG (chlorahexidine gluconate) soap before surgery.  CHG is an antiseptic cleaner which kills germs and bonds with the skin to continue killing germs even after washing.  Please DO NOT use if you have an allergy to CHG or antibacterial soaps.  If your skin becomes reddened/irritated stop using the CHG and inform your nurse when you arrive at  Short Stay.  Do not shave (including legs and underarms) for at least 48 hours prior to the first CHG shower.  You may shave your face.  Please follow these instructions carefully:   1.  Shower with CHG Soap the night before surgery and the morning of Surgery.  2.  If you choose to wash your hair, wash your hair first as usual with your normal shampoo.  3.  After you shampoo, rinse your hair and body thoroughly to remove the Shampoo.  4.  Use CHG as you would any other liquid soap.  You can apply chg directly  to the skin and wash gently with scrungie or a clean washcloth.  5.  Apply the CHG Soap to your body ONLY FROM THE NECK DOWN.  Do not use on open wounds or open sores.  Avoid contact with your eyes ears, mouth and genitals (private parts).  Wash genitals (private parts)       with your normal soap.  6.  Wash thoroughly, paying special attention to the area where your surgery will be performed.  7.  Thoroughly rinse your body with warm water from the neck down.  8.  DO NOT shower/wash with your normal soap after using and rinsing off the CHG Soap.  9.  Pat yourself dry with a clean towel.            10.  Wear clean pajamas.            11.  Place clean sheets on your bed the night of your first shower and do not sleep with pets.  Day of Surgery  Do not apply any lotions/deodorants the morning of surgery.  Please wear clean clothes to the hospital/surgery center.  Please read over the following fact sheets that you were given. Pain Booklet, MRSA Information and Surgical Site Infection Prevention

## 2016-05-31 ENCOUNTER — Encounter (HOSPITAL_COMMUNITY)
Admission: RE | Admit: 2016-05-31 | Discharge: 2016-05-31 | Disposition: A | Discharge status: Home / Self Care | Payer: Self-pay | Source: Ambulatory Visit | Attending: Neurosurgery | Admitting: Neurosurgery

## 2016-05-31 ENCOUNTER — Encounter (HOSPITAL_COMMUNITY): Payer: Self-pay

## 2016-05-31 DIAGNOSIS — Z01812 Encounter for preprocedural laboratory examination: Secondary | ICD-10-CM | POA: Insufficient documentation

## 2016-05-31 DIAGNOSIS — I671 Cerebral aneurysm, nonruptured: Secondary | ICD-10-CM | POA: Insufficient documentation

## 2016-05-31 DIAGNOSIS — F17200 Nicotine dependence, unspecified, uncomplicated: Secondary | ICD-10-CM | POA: Insufficient documentation

## 2016-05-31 DIAGNOSIS — Z7982 Long term (current) use of aspirin: Secondary | ICD-10-CM | POA: Insufficient documentation

## 2016-05-31 DIAGNOSIS — J45909 Unspecified asthma, uncomplicated: Secondary | ICD-10-CM | POA: Insufficient documentation

## 2016-05-31 DIAGNOSIS — Z01818 Encounter for other preprocedural examination: Secondary | ICD-10-CM | POA: Insufficient documentation

## 2016-05-31 DIAGNOSIS — Z8673 Personal history of transient ischemic attack (TIA), and cerebral infarction without residual deficits: Secondary | ICD-10-CM | POA: Insufficient documentation

## 2016-05-31 DIAGNOSIS — Z0183 Encounter for blood typing: Secondary | ICD-10-CM | POA: Insufficient documentation

## 2016-05-31 HISTORY — DX: Headache, unspecified: R51.9

## 2016-05-31 HISTORY — DX: Headache: R51

## 2016-05-31 LAB — ABO/RH: ABO/RH(D): A POS

## 2016-05-31 LAB — SURGICAL PCR SCREEN
MRSA, PCR: NEGATIVE
Staphylococcus aureus: POSITIVE — AB

## 2016-05-31 LAB — BASIC METABOLIC PANEL
ANION GAP: 7 (ref 5–15)
BUN: 13 mg/dL (ref 6–20)
CHLORIDE: 110 mmol/L (ref 101–111)
CO2: 23 mmol/L (ref 22–32)
Calcium: 9.3 mg/dL (ref 8.9–10.3)
Creatinine, Ser: 0.7 mg/dL (ref 0.44–1.00)
GFR calc non Af Amer: >60 mL/min (ref >60)
Glucose, Bld: 109 mg/dL — ABNORMAL HIGH (ref 65–99)
POTASSIUM: 4.1 mmol/L (ref 3.5–5.1)
SODIUM: 140 mmol/L (ref 135–145)

## 2016-05-31 LAB — CBC
HEMATOCRIT: 43.3 % (ref 36.0–46.0)
HEMOGLOBIN: 14.7 g/dL (ref 12.0–15.0)
MCH: 30.3 pg (ref 26.0–34.0)
MCHC: 33.9 g/dL (ref 30.0–36.0)
MCV: 89.3 fL (ref 78.0–100.0)
PLATELETS: 182 10*3/uL (ref 150–400)
RBC: 4.85 MIL/uL (ref 3.87–5.11)
RDW: 14.9 % (ref 11.5–15.5)
WBC: 6.2 10*3/uL (ref 4.0–10.5)

## 2016-06-01 NOTE — Progress Notes (Signed)
Anesthesia Chart Review:  Pt is a 50 year old female scheduled for craniotomy - left pterional for clipping of aneurysm on 06/09/2016 with Dr. Conchita ParisNundkumar.   PMH includes:  Stroke (02/2016), PFO, asthma. Current smoker. BMI 44  Medication: ASA. Pt to stop ASA 06/02/16  Preoperative labs reviewed.    EKG 03/09/16: NSR. Non-specific ST-t changes  TEE 03/29/16:  - Left ventricle: The cavity size was normal. Wall thickness was normal. Systolic function was normal. - Aortic valve: No evidence of vegetation. - Mitral valve: No evidence of vegetation. - Left atrium: No evidence of thrombus in the atrial cavity or appendage. - Atrial septum: There was a medium-sized patent foramen ovale. - Pulmonic valve: No evidence of vegetation.  Echo 03/10/16:  - Left ventricle: The cavity size was normal. Wall thickness was normal. Systolic function was normal. The estimated ejection fraction was in the range of 60% to 65%. Wall motion was normal; there were no regional wall motion abnormalities. There was no evidence of elevated ventricular filling pressure by Doppler parameters. - Impressions: No cardiac source of emboli was indentified.  Carotid duplex 03/10/16:  - The vertebral arteries appear patent with antegrade flow. - Findings consistent with 1- 39 percent stenosis involving the right internal carotid artery and the left internal carotid artery. - Incidental finding: bilateral enlarged thyroid with nodules.  Pt had a 21 day cardiac event monitor placed at the end of April. Pt has not yet turned it in. PAT RN instructed pt to contact cardiologist's office and turn in monitor for interpretation.   Pt has not had neurology follow up since stroke, but she was not instructed to follow up with neuro per discharge instructions/in-hospital neuro notes.   Surgery is scheduled for exactly 3 months after stroke.   Reviewed case with Dr. Mable FillB. Judd.   If no changes, I anticipate pt can proceed with surgery as  scheduled.   Rica Mastngela Avanthika Dehnert, FNP-BC [facility]/Anesthesiology Phone: [phone] 06/01/2016 11:42 AM

## 2016-06-08 ENCOUNTER — Encounter (HOSPITAL_COMMUNITY): Payer: Self-pay | Admitting: Anesthesiology

## 2016-06-08 MED ORDER — CEFAZOLIN SODIUM-DEXTROSE 2-4 GM/100ML-% IV SOLN
2.0000 g | INTRAVENOUS | Status: DC
  Filled 2016-06-08: qty 100

## 2016-06-08 NOTE — Anesthesia Preprocedure Evaluation (Addendum)
Anesthesia Evaluation  Patient identified by MRN, date of birth, ID band Patient awake    Reviewed: Allergy & Precautions, NPO status , Patient's Chart, lab work & pertinent test results  Airway Mallampati: II  TM Distance: >3 FB Neck ROM: Full    Dental no notable dental hx.    Pulmonary asthma , Current Smoker,    Pulmonary exam normal breath sounds clear to auscultation       Cardiovascular + Peripheral Vascular Disease  Normal cardiovascular exam Rhythm:Regular Rate:Normal  Cardiology office note (04-21-16) reviewed.  ECHO 3-24-17Study Conclusions  - Left ventricle: The cavity size was normal. Wall thickness was  normal. Systolic function was normal. The estimated ejection  fraction was in the range of 60% to 65%. Wall motion was normal;  there were no regional wall motion abnormalities. There was no  evidence of elevated ventricular filling pressure by Doppler  parameters.  Impressions:  - No cardiac source of emboli was indentified.    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, Residual Symptoms negative psych ROS   GI/Hepatic negative GI ROS, Neg liver ROS,   Endo/Other  Morbid obesity  Renal/GU negative Renal ROS  negative genitourinary   Musculoskeletal negative musculoskeletal ROS (+)   Abdominal (+) + obese,   Peds negative pediatric ROS (+)  Hematology negative hematology ROS (+)   Anesthesia Other Findings   Reproductive/Obstetrics negative OB ROS                          Anesthesia Physical Anesthesia Plan  ASA: III  Anesthesia Plan: General   Post-op Pain Management:    Induction: Intravenous  Airway Management Planned: Oral ETT  Additional Equipment:   Intra-op Plan:   Post-operative Plan: Extubation in OR  Informed Consent: I have reviewed the patients History and Physical, chart, labs and discussed the procedure including the  risks, benefits and alternatives for the proposed anesthesia with the patient or authorized representative who has indicated his/her understanding and acceptance.   Dental advisory given  Plan Discussed with: CRNA  Anesthesia Plan Comments:         Anesthesia Quick Evaluation

## 2016-06-09 ENCOUNTER — Inpatient Hospital Stay (HOSPITAL_COMMUNITY): Payer: Self-pay | Admitting: Certified Registered Nurse Anesthetist

## 2016-06-09 ENCOUNTER — Inpatient Hospital Stay (HOSPITAL_COMMUNITY): Payer: Self-pay | Admitting: Vascular Surgery

## 2016-06-09 ENCOUNTER — Encounter (HOSPITAL_COMMUNITY): Payer: Self-pay | Admitting: Urology

## 2016-06-09 ENCOUNTER — Inpatient Hospital Stay (HOSPITAL_COMMUNITY): Payer: Self-pay

## 2016-06-09 ENCOUNTER — Inpatient Hospital Stay (HOSPITAL_COMMUNITY)
Admission: RE | Admit: 2016-06-09 | Discharge: 2016-06-15 | DRG: 026 | Disposition: A | Discharge status: Home Health | Payer: Self-pay | Source: Ambulatory Visit | Attending: Neurosurgery | Admitting: Neurosurgery

## 2016-06-09 ENCOUNTER — Encounter (HOSPITAL_COMMUNITY)
Admission: RE | Disposition: A | Discharge status: Home Health | Payer: Self-pay | Source: Ambulatory Visit | Attending: Neurosurgery

## 2016-06-09 DIAGNOSIS — F17210 Nicotine dependence, cigarettes, uncomplicated: Secondary | ICD-10-CM | POA: Diagnosis present

## 2016-06-09 DIAGNOSIS — G8191 Hemiplegia, unspecified affecting right dominant side: Secondary | ICD-10-CM | POA: Diagnosis not present

## 2016-06-09 DIAGNOSIS — I10 Essential (primary) hypertension: Secondary | ICD-10-CM | POA: Diagnosis present

## 2016-06-09 DIAGNOSIS — J45909 Unspecified asthma, uncomplicated: Secondary | ICD-10-CM | POA: Diagnosis present

## 2016-06-09 DIAGNOSIS — I671 Cerebral aneurysm, nonruptured: Principal | ICD-10-CM | POA: Diagnosis present

## 2016-06-09 DIAGNOSIS — Z91048 Other nonmedicinal substance allergy status: Secondary | ICD-10-CM

## 2016-06-09 DIAGNOSIS — R0682 Tachypnea, not elsewhere classified: Secondary | ICD-10-CM | POA: Diagnosis present

## 2016-06-09 DIAGNOSIS — Z7982 Long term (current) use of aspirin: Secondary | ICD-10-CM

## 2016-06-09 DIAGNOSIS — Z885 Allergy status to narcotic agent status: Secondary | ICD-10-CM

## 2016-06-09 DIAGNOSIS — Z452 Encounter for adjustment and management of vascular access device: Secondary | ICD-10-CM

## 2016-06-09 DIAGNOSIS — Z6841 Body Mass Index (BMI) 40.0 and over, adult: Secondary | ICD-10-CM

## 2016-06-09 DIAGNOSIS — R001 Bradycardia, unspecified: Secondary | ICD-10-CM | POA: Diagnosis present

## 2016-06-09 DIAGNOSIS — Z8673 Personal history of transient ischemic attack (TIA), and cerebral infarction without residual deficits: Secondary | ICD-10-CM

## 2016-06-09 HISTORY — PX: CRANIOTOMY: SHX93

## 2016-06-09 LAB — PREPARE RBC (CROSSMATCH)

## 2016-06-09 SURGERY — CRANIOTOMY INTRACRANIAL ANEURYSM FOR CAROTID
Anesthesia: General | Laterality: Left

## 2016-06-09 MED ORDER — DEXAMETHASONE SODIUM PHOSPHATE 10 MG/ML IJ SOLN
INTRAMUSCULAR | Status: DC | PRN
  Administered 2016-06-09: 10 mg via INTRAVENOUS

## 2016-06-09 MED ORDER — ONDANSETRON HCL 4 MG PO TABS: 4.0000 mg | ORAL_TABLET | ORAL | Status: DC | PRN

## 2016-06-09 MED ORDER — SUGAMMADEX SODIUM 200 MG/2ML IV SOLN
INTRAVENOUS | Status: DC | PRN
  Administered 2016-06-09: 200 mg via INTRAVENOUS

## 2016-06-09 MED ORDER — LACTATED RINGERS IV SOLN
INTRAVENOUS | Status: DC | PRN
  Administered 2016-06-09: 07:00:00 via INTRAVENOUS

## 2016-06-09 MED ORDER — MIDAZOLAM HCL 2 MG/2ML IJ SOLN
INTRAMUSCULAR | Status: AC
  Filled 2016-06-09: qty 2

## 2016-06-09 MED ORDER — NICARDIPINE HCL IN NACL 20-0.86 MG/200ML-% IV SOLN
3.0000 mg/h | INTRAVENOUS | Status: DC
  Filled 2016-06-09: qty 200

## 2016-06-09 MED ORDER — ONDANSETRON HCL 4 MG/2ML IJ SOLN: 4.0000 mg | INTRAMUSCULAR | Status: DC | PRN

## 2016-06-09 MED ORDER — ONDANSETRON HCL 4 MG/2ML IJ SOLN
INTRAMUSCULAR | Status: DC | PRN
  Administered 2016-06-09: 4 mg via INTRAVENOUS

## 2016-06-09 MED ORDER — ACETAMINOPHEN 10 MG/ML IV SOLN
INTRAVENOUS | Status: AC
  Administered 2016-06-09: 1000 mg via INTRAVENOUS
  Filled 2016-06-09: qty 100

## 2016-06-09 MED ORDER — 0.9 % SODIUM CHLORIDE (POUR BTL) OPTIME
TOPICAL | Status: DC | PRN
  Administered 2016-06-09 (×3): 1000 mL

## 2016-06-09 MED ORDER — BACITRACIN ZINC 500 UNIT/GM EX OINT
TOPICAL_OINTMENT | CUTANEOUS | Status: DC | PRN
  Administered 2016-06-09 (×2): 1 via TOPICAL

## 2016-06-09 MED ORDER — ROCURONIUM BROMIDE 50 MG/5ML IV SOLN
INTRAVENOUS | Status: AC
  Filled 2016-06-09: qty 1

## 2016-06-09 MED ORDER — PROMETHAZINE HCL 25 MG PO TABS: 12.5000 mg | ORAL_TABLET | ORAL | Status: DC | PRN

## 2016-06-09 MED ORDER — DEXAMETHASONE 4 MG PO TABS
4.0000 mg | ORAL_TABLET | Freq: Three times a day (TID) | ORAL | Status: AC
  Administered 2016-06-11 – 2016-06-12 (×3): 4 mg via ORAL
  Filled 2016-06-09 (×3): qty 1

## 2016-06-09 MED ORDER — SODIUM CHLORIDE 0.9 % IV SOLN
INTRAVENOUS | Status: DC
  Administered 2016-06-09: 16:00:00 via INTRAVENOUS
  Administered 2016-06-10: 1000 mL via INTRAVENOUS
  Administered 2016-06-10: 75 mL via INTRAVENOUS
  Administered 2016-06-12: 11:00:00 via INTRAVENOUS

## 2016-06-09 MED ORDER — FENTANYL CITRATE (PF) 100 MCG/2ML IJ SOLN: 25.0000 ug | INTRAMUSCULAR | Status: DC | PRN

## 2016-06-09 MED ORDER — PHENYLEPHRINE HCL 10 MG/ML IJ SOLN
INTRAMUSCULAR | Status: DC | PRN
  Administered 2016-06-09 (×2): 80 ug via INTRAVENOUS

## 2016-06-09 MED ORDER — HEMOSTATIC AGENTS (NO CHARGE) OPTIME
TOPICAL | Status: DC | PRN
  Administered 2016-06-09: 1 via TOPICAL

## 2016-06-09 MED ORDER — ACETAMINOPHEN 325 MG PO TABS: 650.0000 mg | ORAL_TABLET | ORAL | Status: DC | PRN

## 2016-06-09 MED ORDER — LIDOCAINE HCL (PF) 1 % IJ SOLN
INTRAMUSCULAR | Status: DC | PRN
  Administered 2016-06-09 (×2): 10 mL

## 2016-06-09 MED ORDER — PROMETHAZINE HCL 25 MG/ML IJ SOLN: 6.2500 mg | INTRAMUSCULAR | Status: DC | PRN

## 2016-06-09 MED ORDER — PHENYLEPHRINE HCL 10 MG/ML IJ SOLN
10.0000 mg | INTRAVENOUS | Status: DC | PRN
  Administered 2016-06-09: 40 ug/min via INTRAVENOUS
  Administered 2016-06-09: 25 ug/min via INTRAVENOUS

## 2016-06-09 MED ORDER — LABETALOL HCL 5 MG/ML IV SOLN: 10.0000 mg | INTRAVENOUS | Status: DC | PRN

## 2016-06-09 MED ORDER — MANNITOL 25 % IV SOLN
INTRAVENOUS | Status: DC | PRN
  Administered 2016-06-09: 25 g via INTRAVENOUS

## 2016-06-09 MED ORDER — NITROGLYCERIN IN D5W 200-5 MCG/ML-% IV SOLN
0.0000 ug/min | INTRAVENOUS | Status: DC
  Filled 2016-06-09: qty 250

## 2016-06-09 MED ORDER — ACETAMINOPHEN 650 MG RE SUPP: 650.0000 mg | RECTAL | Status: DC | PRN

## 2016-06-09 MED ORDER — SENNA 8.6 MG PO TABS
1.0000 | ORAL_TABLET | Freq: Two times a day (BID) | ORAL | Status: DC
  Administered 2016-06-09 – 2016-06-15 (×10): 8.6 mg via ORAL
  Filled 2016-06-09 (×10): qty 1

## 2016-06-09 MED ORDER — FENTANYL CITRATE (PF) 250 MCG/5ML IJ SOLN
INTRAMUSCULAR | Status: AC
  Filled 2016-06-09: qty 5

## 2016-06-09 MED ORDER — PROPOFOL 10 MG/ML IV BOLUS
INTRAVENOUS | Status: AC
  Filled 2016-06-09: qty 20

## 2016-06-09 MED ORDER — BISACODYL 10 MG RE SUPP: 10.0000 mg | Freq: Every day | RECTAL | Status: DC | PRN

## 2016-06-09 MED ORDER — SODIUM CHLORIDE 0.9 % IV SOLN
0.0500 ug/kg/min | INTRAVENOUS | Status: DC
  Filled 2016-06-09: qty 5000

## 2016-06-09 MED ORDER — BUPIVACAINE HCL 0.5 % IJ SOLN
INTRAMUSCULAR | Status: DC | PRN
  Administered 2016-06-09 (×2): 10 mL

## 2016-06-09 MED ORDER — PANTOPRAZOLE SODIUM 20 MG PO TBEC
20.0000 mg | DELAYED_RELEASE_TABLET | Freq: Every day | ORAL | Status: DC
  Administered 2016-06-10 – 2016-06-15 (×5): 20 mg via ORAL
  Filled 2016-06-09 (×8): qty 1

## 2016-06-09 MED ORDER — THROMBIN 20000 UNITS EX SOLR
CUTANEOUS | Status: DC | PRN
  Administered 2016-06-09: 10:00:00 via TOPICAL

## 2016-06-09 MED ORDER — CEFAZOLIN SODIUM-DEXTROSE 2-4 GM/100ML-% IV SOLN
2.0000 g | Freq: Three times a day (TID) | INTRAVENOUS | Status: AC
  Administered 2016-06-09 (×2): 2 g via INTRAVENOUS
  Filled 2016-06-09 (×2): qty 100

## 2016-06-09 MED ORDER — SODIUM CHLORIDE 0.9 % IV SOLN
1000.0000 mg | Freq: Two times a day (BID) | INTRAVENOUS | Status: DC
  Administered 2016-06-09: 1000 mg via INTRAVENOUS
  Filled 2016-06-09 (×3): qty 10

## 2016-06-09 MED ORDER — ROCURONIUM BROMIDE 100 MG/10ML IV SOLN
INTRAVENOUS | Status: DC | PRN
  Administered 2016-06-09 (×2): 50 mg via INTRAVENOUS

## 2016-06-09 MED ORDER — MORPHINE SULFATE (PF) 2 MG/ML IV SOLN
1.0000 mg | INTRAVENOUS | Status: DC | PRN
  Administered 2016-06-09 – 2016-06-10 (×2): 2 mg via INTRAVENOUS
  Filled 2016-06-09 (×2): qty 1

## 2016-06-09 MED ORDER — ARTIFICIAL TEARS OP OINT
TOPICAL_OINTMENT | OPHTHALMIC | Status: DC | PRN
Start: 2016-06-09 — End: 2016-06-09
  Administered 2016-06-09: 1 via OPHTHALMIC

## 2016-06-09 MED ORDER — THROMBIN 5000 UNITS EX SOLR
CUTANEOUS | Status: DC | PRN
  Administered 2016-06-09: 10:00:00 via TOPICAL

## 2016-06-09 MED ORDER — SODIUM CHLORIDE 0.9 % IR SOLN
Status: DC | PRN
  Administered 2016-06-09: 10:00:00

## 2016-06-09 MED ORDER — HYDROMORPHONE HCL 1 MG/ML IJ SOLN: 0.2500 mg | INTRAMUSCULAR | Status: DC | PRN

## 2016-06-09 MED ORDER — PROPOFOL 10 MG/ML IV BOLUS
INTRAVENOUS | Status: DC | PRN
  Administered 2016-06-09: 50 mg via INTRAVENOUS
  Administered 2016-06-09: 150 mg via INTRAVENOUS

## 2016-06-09 MED ORDER — LIDOCAINE HCL (CARDIAC) 20 MG/ML IV SOLN
INTRAVENOUS | Status: DC | PRN
  Administered 2016-06-09: 60 mg via INTRAVENOUS

## 2016-06-09 MED ORDER — FENTANYL CITRATE (PF) 100 MCG/2ML IJ SOLN
INTRAMUSCULAR | Status: DC | PRN
  Administered 2016-06-09: 50 ug via INTRAVENOUS
  Administered 2016-06-09: 100 ug via INTRAVENOUS

## 2016-06-09 MED ORDER — SODIUM CHLORIDE 0.9 % IV SOLN
2000.0000 ug | INTRAVENOUS | Status: DC | PRN
  Administered 2016-06-09: .2 ug/kg/min via INTRAVENOUS

## 2016-06-09 MED ORDER — GLYCOPYRROLATE 0.2 MG/ML IJ SOLN
INTRAMUSCULAR | Status: DC | PRN
  Administered 2016-06-09: 0.2 mg via INTRAVENOUS

## 2016-06-09 MED ORDER — DOCUSATE SODIUM 100 MG PO CAPS
100.0000 mg | ORAL_CAPSULE | Freq: Two times a day (BID) | ORAL | Status: DC
  Administered 2016-06-09 – 2016-06-15 (×10): 100 mg via ORAL
  Filled 2016-06-09 (×10): qty 1

## 2016-06-09 MED ORDER — MIDAZOLAM HCL 5 MG/5ML IJ SOLN
INTRAMUSCULAR | Status: DC | PRN
  Administered 2016-06-09: 2 mg via INTRAVENOUS

## 2016-06-09 MED ORDER — DEXAMETHASONE 4 MG PO TABS
4.0000 mg | ORAL_TABLET | Freq: Four times a day (QID) | ORAL | Status: AC
  Administered 2016-06-10 – 2016-06-11 (×4): 4 mg via ORAL
  Filled 2016-06-09 (×4): qty 1

## 2016-06-09 MED ORDER — DEXAMETHASONE 6 MG PO TABS
6.0000 mg | ORAL_TABLET | Freq: Four times a day (QID) | ORAL | Status: AC
  Administered 2016-06-09 – 2016-06-10 (×4): 6 mg via ORAL
  Filled 2016-06-09 (×4): qty 1

## 2016-06-09 MED ORDER — INDOCYANINE GREEN 25 MG IV SOLR
5.0000 mg | Freq: Once | INTRAVENOUS | Status: DC
  Filled 2016-06-09: qty 25

## 2016-06-09 MED ORDER — SODIUM CHLORIDE 0.9 % IV SOLN
500.0000 mg | Freq: Two times a day (BID) | INTRAVENOUS | Status: DC
  Administered 2016-06-09 – 2016-06-10 (×2): 500 mg via INTRAVENOUS
  Filled 2016-06-09 (×5): qty 5

## 2016-06-09 MED ORDER — SODIUM CHLORIDE 0.9 % IV SOLN: Freq: Once | INTRAVENOUS | Status: DC

## 2016-06-09 SURGICAL SUPPLY — 113 items
APL SKNCLS STERI-STRIP NONHPOA (GAUZE/BANDAGES/DRESSINGS)
BANDAGE GAUZE 4  KLING STR (GAUZE/BANDAGES/DRESSINGS) ×2 IMPLANT
BATTERY IQ STERILE (MISCELLANEOUS) ×2 IMPLANT
BENZOIN TINCTURE PRP APPL 2/3 (GAUZE/BANDAGES/DRESSINGS) IMPLANT
BIT DRILL WIRE PASS 1.3MM (BIT) IMPLANT
BLADE SAW GIGLI 16 STRL (MISCELLANEOUS) IMPLANT
BLADE SURG 15 STRL LF DISP TIS (BLADE) ×1 IMPLANT
BLADE SURG 15 STRL SS (BLADE) ×3
BLADE ULTRA TIP 2M (BLADE) IMPLANT
BNDG GAUZE ELAST 4 BULKY (GAUZE/BANDAGES/DRESSINGS) ×4 IMPLANT
BRUSH SCRUB EZ 1% IODOPHOR (MISCELLANEOUS) ×3 IMPLANT
BRUSH SCRUB EZ PLAIN DRY (MISCELLANEOUS) ×3 IMPLANT
BTRY SRG DRVR 1.5 IQ (MISCELLANEOUS) ×1
BUR ACORN 6.0 PRECISION (BURR) ×2 IMPLANT
BUR ACORN 6.0MM PRECISION (BURR) ×1
BUR ADDG 1.1 (BURR) IMPLANT
BUR ADDG 1.1MM (BURR)
BUR MATCHSTICK NEURO 3.0 LAGG (BURR) ×2 IMPLANT
BUR ROUND FLUTED 4 SOFT TCH (BURR) ×1 IMPLANT
BUR ROUND FLUTED 4MM SOFT TCH (BURR) ×1
BUR SPIRAL ROUTER 2.3 (BUR) ×1 IMPLANT
BUR SPIRAL ROUTER 2.3MM (BUR) ×1
CANISTER SUCT 3000ML PPV (MISCELLANEOUS) ×3 IMPLANT
CLIP ANEURY TI PERM STD BAYO12 (Clip) ×2 IMPLANT
CLIP ANEURY TI PERM STD CVD 8M (Clip) ×2 IMPLANT
CLIP TI MEDIUM 6 (CLIP) IMPLANT
CORDS BIPOLAR (ELECTRODE) ×3 IMPLANT
COVER BACK TABLE 60X90IN (DRAPES) ×2 IMPLANT
COVER MAYO STAND STRL (DRAPES) IMPLANT
DECANTER SPIKE VIAL GLASS SM (MISCELLANEOUS) ×3 IMPLANT
DRAIN SNY WOU 7FLT (WOUND CARE) IMPLANT
DRAPE MICROSCOPE LEICA (MISCELLANEOUS) ×3 IMPLANT
DRAPE NEUROLOGICAL W/INCISE (DRAPES) ×3 IMPLANT
DRAPE PROXIMA HALF (DRAPES) ×6 IMPLANT
DRAPE WARM FLUID 44X44 (DRAPE) ×3 IMPLANT
DRILL WIRE PASS 1.3MM (BIT) ×3
DRSG ADAPTIC 3X8 NADH LF (GAUZE/BANDAGES/DRESSINGS) ×3 IMPLANT
DRSG TELFA 3X8 NADH (GAUZE/BANDAGES/DRESSINGS) ×3 IMPLANT
DURAMATRIX ONLAY 3X3 (Plate) ×2 IMPLANT
DURAPREP 6ML APPLICATOR 50/CS (WOUND CARE) ×5 IMPLANT
ELECT REM PT RETURN 9FT ADLT (ELECTROSURGICAL) ×3
ELECTRODE REM PT RTRN 9FT ADLT (ELECTROSURGICAL) ×1 IMPLANT
EVACUATOR SILICONE 100CC (DRAIN) IMPLANT
FORCEPS BIPOLAR SPETZLER 8 1.0 (NEUROSURGERY SUPPLIES) ×2 IMPLANT
GAUZE SPONGE 4X4 12PLY STRL (GAUZE/BANDAGES/DRESSINGS) ×2 IMPLANT
GAUZE SPONGE 4X4 16PLY XRAY LF (GAUZE/BANDAGES/DRESSINGS) ×2 IMPLANT
GLOVE BIOGEL PI IND STRL 7.0 (GLOVE) IMPLANT
GLOVE BIOGEL PI IND STRL 7.5 (GLOVE) ×1 IMPLANT
GLOVE BIOGEL PI INDICATOR 7.0 (GLOVE) ×2
GLOVE BIOGEL PI INDICATOR 7.5 (GLOVE) ×4
GLOVE ECLIPSE 7.0 STRL STRAW (GLOVE) ×6 IMPLANT
GLOVE ECLIPSE 8.5 STRL (GLOVE) ×2 IMPLANT
GLOVE EXAM NITRILE LRG STRL (GLOVE) IMPLANT
GLOVE EXAM NITRILE MD LF STRL (GLOVE) IMPLANT
GLOVE EXAM NITRILE XL STR (GLOVE) IMPLANT
GLOVE EXAM NITRILE XS STR PU (GLOVE) IMPLANT
GOWN STRL REUS W/ TWL LRG LVL3 (GOWN DISPOSABLE) ×2 IMPLANT
GOWN STRL REUS W/ TWL XL LVL3 (GOWN DISPOSABLE) IMPLANT
GOWN STRL REUS W/TWL 2XL LVL3 (GOWN DISPOSABLE) IMPLANT
GOWN STRL REUS W/TWL LRG LVL3 (GOWN DISPOSABLE) ×6
GOWN STRL REUS W/TWL XL LVL3 (GOWN DISPOSABLE)
HEMOSTAT POWDER KIT SURGIFOAM (HEMOSTASIS) ×2 IMPLANT
HEMOSTAT SURGICEL 2X14 (HEMOSTASIS) ×3 IMPLANT
HOOK DURA (MISCELLANEOUS) ×3 IMPLANT
HOOK DURA 1/2IN (MISCELLANEOUS) ×3 IMPLANT
KIT BASIN OR (CUSTOM PROCEDURE TRAY) ×3 IMPLANT
KIT DRAIN CSF ACCUDRAIN (MISCELLANEOUS) IMPLANT
KIT ROOM TURNOVER OR (KITS) ×3 IMPLANT
KNIFE ARACHNOID DISP AM-24-S (MISCELLANEOUS) ×2 IMPLANT
MARKER SKIN DUAL TIP RULER LAB (MISCELLANEOUS) ×2 IMPLANT
NDL HYPO 25GX1X1/2 BEV (NEEDLE) IMPLANT
NDL HYPO 25X1 1.5 SAFETY (NEEDLE) ×1 IMPLANT
NDL SPNL 25GX3.5 QUINCKE BL (NEEDLE) IMPLANT
NEEDLE HYPO 25GX1X1/2 BEV (NEEDLE) IMPLANT
NEEDLE HYPO 25X1 1.5 SAFETY (NEEDLE) ×9 IMPLANT
NEEDLE SPNL 25GX3.5 QUINCKE BL (NEEDLE) IMPLANT
NS IRRIG 1000ML POUR BTL (IV SOLUTION) ×7 IMPLANT
PACK CRANIOTOMY (CUSTOM PROCEDURE TRAY) ×3 IMPLANT
PAD ARMBOARD 7.5X6 YLW CONV (MISCELLANEOUS) ×7 IMPLANT
PAD DRESSING TELFA 3X8 NADH (GAUZE/BANDAGES/DRESSINGS) ×1 IMPLANT
PATTIES SURGICAL .25X.25 (GAUZE/BANDAGES/DRESSINGS) IMPLANT
PATTIES SURGICAL .5 X.5 (GAUZE/BANDAGES/DRESSINGS) ×2 IMPLANT
PATTIES SURGICAL .5 X3 (DISPOSABLE) IMPLANT
PATTIES SURGICAL 1/4 X 3 (GAUZE/BANDAGES/DRESSINGS) IMPLANT
PATTIES SURGICAL 1X1 (DISPOSABLE) IMPLANT
PIN MAYFIELD SKULL DISP (PIN) ×2 IMPLANT
PLATE 1.5  2HOLE LNG NEURO (Plate) ×4 IMPLANT
PLATE 1.5 2HOLE LNG NEURO (Plate) IMPLANT
PLATE 1.5 4HOLE LONG STRAIGHT (Plate) ×2 IMPLANT
PLATE 1.5/0.5 35MM NEURO GAP (Plate) ×2 IMPLANT
RUBBERBAND STERILE (MISCELLANEOUS) ×6 IMPLANT
SCREW EMERGENCY BONE 1.8X4MM (Screw) ×2 IMPLANT
SCREW SELF DRILL HT 1.5/4MM (Screw) ×16 IMPLANT
SPONGE NEURO XRAY DETECT 1X3 (DISPOSABLE) IMPLANT
SPONGE SURGIFOAM ABS GEL 100 (HEMOSTASIS) ×3 IMPLANT
SPONGE SURGIFOAM ABS GEL 100C (HEMOSTASIS) IMPLANT
STAPLER VISISTAT 35W (STAPLE) ×5 IMPLANT
STOCKINETTE 6  STRL (DRAPES) ×2
STOCKINETTE 6 STRL (DRAPES) ×1 IMPLANT
SUT ETHILON 3 0 FSL (SUTURE) IMPLANT
SUT NURALON 4 0 TR CR/8 (SUTURE) ×4 IMPLANT
SUT VIC AB 0 CT1 18XCR BRD8 (SUTURE) ×2 IMPLANT
SUT VIC AB 0 CT1 8-18 (SUTURE) ×6
SUT VIC AB 3-0 SH 8-18 (SUTURE) ×7 IMPLANT
SYR CONTROL 10ML LL (SYRINGE) ×3 IMPLANT
SYR TB 1ML 25GX5/8 (SYRINGE) IMPLANT
TOWEL OR 17X24 6PK STRL BLUE (TOWEL DISPOSABLE) ×3 IMPLANT
TOWEL OR 17X26 10 PK STRL BLUE (TOWEL DISPOSABLE) ×3 IMPLANT
TRAY FOLEY W/METER SILVER 16FR (SET/KITS/TRAYS/PACK) ×2 IMPLANT
TUBE CONNECTING 12'X1/4 (SUCTIONS) ×1
TUBE CONNECTING 12X1/4 (SUCTIONS) ×1 IMPLANT
UNDERPAD 30X30 INCONTINENT (UNDERPADS AND DIAPERS) IMPLANT
WATER STERILE IRR 1000ML POUR (IV SOLUTION) ×3 IMPLANT

## 2016-06-09 NOTE — Op Note (Signed)
PREOP DIAGNOSIS: Left Posterior communicating artery aneurysm  POSTOP DIAGNOSIS: Same  PROCEDURE: 1. Left fronto-temporal craniotomy for clipping of posterior communicating artery aneurysm. 2. Use of operating microscope for microdissection  SURGEON: Dr. Lisbeth RenshawNeelesh Markiya Keefe, MD  ASSISTANT: Dr. Julio SicksHenry Pool, MD  ANESTHESIA: General Endotracheal  EBL: 300cc  SPECIMENS: None  DRAINS: None  COMPLICATIONS: None immediate  CONDITION: Hemodynamically stable to PACU  HISTORY: Erika Wong is a 50 y.o. female initially presenting a few months ago with cerebellar strokes. Workup incidentally discovered a large left Pcom aneurysm. She was further worked up electively with diagnostic cerebral angiogram. Treatment options were discussed, and she elected to proceed with surgical clipping. After risks, benefits and alternatives were discussed and all questions were answered, consent was obtained.  PROCEDURE IN DETAIL: After informed consent was obtained and witnessed, the patient was brought to the operating room. After induction of general anesthesia, the patient was positioned on the operative table in the [place] in the prone position. All pressure points were meticulously padded. Skin incision was then marked out and prepped and draped in the usual sterile fashion.  After time-out was conducted, skin incision was made sharply and Bovie electrocautery was used to dissect the subcutaneous tissue and galea. Raney clips were then used to secure hemostasis on the skin edges. Bovie electrocautery was used to dissect through the pericranium.  Interfascial dissection was carried out along the deep temporal fascia, and the skin flap was reflected anteriorly. Temporalis was then incised and reflected inferiorly.  Bur holes were then created in the pterion, above the root of the zygoma, and the superior temporal line. These are then connected with the craniotome and a standard pterional craniotomy flap was  elevated. Hemostasis was achieved on the bone edges, and a high-speed drill was used to drill down the lesser wing of the sphenoid.  The dura was then opened in cruciate fashion and good hemostasis was achieved on the dural edges. At this point the microscope was draped and brought into the field and the remainder of the case was done under the microscope using microdissection.  The optic nerve and the optico-carotid cistern were then incised and dissection was carried laterally until the internal carotid artery was identified. Our attention was then turned to dissection of the internal carotid artery nearly to the level of the ICA bifurcation. At this point the distal sylvian fissure was split in standard fashion. The origin of the posterior communicating artery was then identified, and the neck of the aneurysm was identified. Initially, a standard curved Aesculap aneurysm clip was selected and placed across the neck of the aneurysm. Further dissection was carried out, and it appeared that the distal clip blades did not completely clip the neck. The clip was therefore removed, and a 12mm bayoneted straight clip was again placed across the neck. This did appear to fully clip the neck. We were unable to fully visualize the distal Pcom without undue traction on the carotid, and we therefore did not feel ICG would be of any benefit.  At this point the wound is irrigated with copious amounts of normal saline irrigation. Good hemostasis was confirmed on the brain surface. The dura was then reapproximated using a combination of interrupted 4-0 Nurolon stitches. A piece of Duramatrix was then placed over the dura. Bone flap was replaced with standard titanium plates and screws. Muscle was then closed using interrupted 0 Vicryl stitches, and the galea was closed using interrupted 3-0 Vicryl sutures. The skin was closed using standard  surgical skin staples. Sterile dressing was then applied after the [place] head  holder was removed. The patient was then transferred to the stretcher and taken to the PACU in stable hemodynamic condition.  At the end of the case all sponge, needle, cottonoid, and instrument counts were correct.

## 2016-06-09 NOTE — Anesthesia Postprocedure Evaluation (Addendum)
Anesthesia Post Note  Patient: Erika Wong  Procedure(s) Performed: Procedure(s) (LRB): Craniotomy - left pterional for clipping of aneurysm (Left)  Patient location during evaluation: PACU Anesthesia Type: General Level of consciousness: lethargic and patient cooperative Pain management: pain level controlled Vital Signs Assessment: post-procedure vital signs reviewed and stable Respiratory status: spontaneous breathing, nonlabored ventilation, respiratory function stable and patient connected to nasal cannula oxygen Cardiovascular status: blood pressure returned to baseline and stable Postop Assessment: no signs of nausea or vomiting Anesthetic complications: no Comments: At the time of discharge from PACU, she responds to commands including moving her right side, states her name and her location. CXR reviewed: no pneumothorax. Predominant interstitial disease. Possible pulmonary edema discussed with Dr. Conchita ParisNundkumar. Oxygen saturations stable on nasal cannula.    Last Vitals:  Filed Vitals:   06/09/16 1600 06/09/16 1700  BP: 127/103   Pulse: 53 51  Temp:    Resp: 18 22    Last Pain:  Filed Vitals:   06/09/16 1706  PainSc: Asleep                 Forrester Blando J

## 2016-06-09 NOTE — Anesthesia Procedure Notes (Addendum)
Procedure Name: Intubation Date/Time: 06/09/2016 8:04 AM Performed by: Tillman AbideHAWKINS, JOSHUA B Pre-anesthesia Checklist: Patient identified, Emergency Drugs available, Suction available and Patient being monitored Patient Re-evaluated:Patient Re-evaluated prior to inductionOxygen Delivery Method: Circle System Utilized Preoxygenation: Pre-oxygenation with 100% oxygen Intubation Type: IV induction Ventilation: Mask ventilation without difficulty Laryngoscope Size: Mac and 3 Grade View: Grade I Tube type: Oral Tube size: 7.0 mm Number of attempts: 1 Airway Equipment and Method: Stylet Placement Confirmation: ETT inserted through vocal cords under direct vision,  positive ETCO2 and breath sounds checked- equal and bilateral Secured at: 22 cm Tube secured with: Tape Dental Injury: Teeth and Oropharynx as per pre-operative assessment     Central Venous Catheter Insertion 06/09/2016 7:15 AM Patient location: Pre-op. Preanesthetic checklist: patient identified, IV checked, site marked, risks and benefits discussed, surgical consent, monitors and equipment checked, pre-op evaluation, timeout performed and anesthesia consent Position: supine Lidocaine 1% used for infiltration Landmarks identified and Seldinger technique used Central line was placed.Double lumen Procedure performed using ultrasound guided technique. Attempts: 2 Following insertion, line sutured, dressing applied and Biopatch. Post procedure assessment: blood return through all ports, free fluid flow and no air. Patient tolerated the procedure well with no immediate complications. Additional procedure comments: Right IJ double lumen CVC placed. First attempt successful with wire placement, however, dilator would not pas (resistance). Second attempt successful with free flow of venous blood. Tolerated well..Marland Kitchen

## 2016-06-09 NOTE — Care Management Note (Signed)
Case Management Note  Patient Details  Name: Esaw GrandchildLeah Duford MRN: [id number] Date of Birth: 1966-06-27  Subjective/Objective:  Pt admitted on 06/09/16 s/p craniotomy for clipping of aneurysm.  PTA, pt independent, lives with spouse.                      Action/Plan: Will follow post op for discharge planning needs.    Expected Discharge Date:                  Expected Discharge Plan:  Home/Self Care  In-House Referral:     Discharge planning Services  CM Consult  Post Acute Care Choice:    Choice offered to:     DME Arranged:    DME Agency:     HH Arranged:    HH Agency:     Status of Service:  In process, will continue to follow  If discussed at Long Length of Stay Meetings, dates discussed:    Additional Comments:  Quintella BatonJulie W. Jady Braggs, RN, BSN  Trauma/Neuro ICU Case Manager [phone]

## 2016-06-09 NOTE — H&P (Signed)
CC:  Aneurysm  HPI: This Erika Wong is a 50 year old woman I have initially seen in consultation as an inpatient a few months ago, when she presented with right sided numbness and weakness. MRI and MRA were done as part of a stroke workup which did demonstrate multiple right cerebellar strokes likely responsible for her symptoms. 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 During her hospitalization, her symptoms rapidly improved back to baseline. She was discharged, and she was subsequently seen in the office. We have done a diagnostic angiogram for further workup, and the decision was made to proceed with surgical clipping. Of note, she does have a medical history of hypertension, family history of intracranial ruptured aneurysm in her aunt, and she is a one pack per day smoker.   PMH: Past Medical History  Diagnosis Date  . Asthma   . Tobacco abuse   . Stroke (HCC)   . Headache     hx    PSH: Past Surgical History  Procedure Laterality Date  . Tee without cardioversion N/A 03/29/2016    Procedure: TRANSESOPHAGEAL ECHOCARDIOGRAM (TEE);  Surgeon: Vesta MixerPhilip J Nahser, MD;  [facility];  Service: Cardiovascular;  Laterality: N/A;  . Tubal ligation  5887    SH: Social History  Substance Use Topics  . Smoking status: Current Every Day Smoker -- 0.50 packs/day for 35 years    Types: Cigarettes  . Smokeless tobacco: Never Used  . Alcohol Use: 0.0 oz/week    0 Standard drinks or equivalent per week     Comment: last 1/17    MEDS: Prior to Admission medications   Medication Sig Start Date End Date Taking? Authorizing Provider  aspirin 325 MG tablet Take 1 tablet (325 mg total) by mouth daily. 03/10/16  Yes Nishant Dhungel, MD    ALLERGY: Allergies  Allergen Reactions  . Adhesive [Tape] Rash    Heart monitor electrodes   . Codeine Other (See Comments)    "makes me feel funny"  . Hydrocodone Other (See Comments)   "Makes me feel funny"    ROS: ROS  NEUROLOGIC EXAM: Awake, alert, oriented Memory and concentration grossly intact Speech fluent, appropriate CN grossly intact Motor exam: Upper Extremities Deltoid Bicep Tricep Grip  Right 5/5 5/5 5/5 5/5  Left 5/5 5/5 5/5 5/5   Lower Extremity IP Quad PF DF EHL  Right 5/5 5/5 5/5 5/5 5/5  Left 5/5 5/5 5/5 5/5 5/5   Sensation grossly intact to LT  [facility] Diagnostic cerebral angiogram was reviewed, which demonstrates a approximately 11 mm aneurysm arising from the left posterior communicating artery itself. Aneurysm was bilobed.  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 Unfortunately, I don't think flow diversion is an option for this aneurysm as it arises directly from the posterior indicating artery itself, and not the internal carotid artery. In addition, with the size of this aneurysm, I think the chance of aneurysm recurrence with stent supported coiling is actually quite high. I therefore think the best treatment option to preserve the posterior communicating artery, and minimize recurrence risk is surgical clipping.  PLAN: left pterional craniotomy for clipping of posterior communicating artery aneurysm   I have reviewed the angiogram with the patient and her husband in the office. Treatment options were discussed, including the rationale for my recommendation for surgery delineated above. Risks of the surgery were discussed, including the risk of stroke leading to right-sided numbness, weakness,  paralysis, coma, death, bleeding, infection, seizure, hydrocephalus. Risks of anesthesia including DVT, PE, heart attack, and stroke were also discussed. The patient understood our discussion, and is willing to proceed with treatment of the aneurysm. All questions were answered.

## 2016-06-09 NOTE — Transfer of Care (Signed)
Immediate Anesthesia Transfer of Care Note  Patient: Erika Wong  Procedure(s) Performed: Procedure(s): Craniotomy - left pterional for clipping of aneurysm (Left)  Patient Location: PACU  Anesthesia Type:General  Level of Consciousness: lethargic  Airway & Oxygen Therapy: non-rebreather face mask  Post-op Assessment: Post -op Vital signs reviewed and stable and Patient moving all extremities X 4  Post vital signs: stable  Last Vitals:  Filed Vitals:   06/09/16 0626  BP: 116/71  Pulse: 75  Temp: 36.9 C  Resp: 18    Last Pain: There were no vitals filed for this visit.       Complications: No apparent anesthesia complications   Patient transported to PACU.  Placed on Non-Rebreather.  Dr. Council Mechanicenenny at bedside.  Remains sluggish.  Will squint eyes to verbal stimulation.  Moves all extremeties.  Right side weaker than left.

## 2016-06-10 MED ORDER — PNEUMOCOCCAL VAC POLYVALENT 25 MCG/0.5ML IJ INJ
0.5000 mL | INJECTION | INTRAMUSCULAR | Status: AC
  Administered 2016-06-11: 0.5 mL via INTRAMUSCULAR
  Filled 2016-06-10: qty 0.5

## 2016-06-10 MED ORDER — LEVETIRACETAM 500 MG PO TABS
500.0000 mg | ORAL_TABLET | Freq: Two times a day (BID) | ORAL | Status: DC
  Administered 2016-06-10 – 2016-06-15 (×10): 500 mg via ORAL
  Filled 2016-06-10 (×10): qty 1

## 2016-06-10 NOTE — Progress Notes (Signed)
Postop day 1. Patient awakens easily. Complains of some headache. No other problems.  Afebrile. Bradycardia stable. Blood pressure well controlled. Patient awake and alert. She is oriented and appropriate. Her speech is reasonably fluent. Motor examination reveals some very mild weakness on the right side which is improved from initially postop. No current pronator drift. Wound clean and dry. Chest and abdomen benign.  Overall doing well. Begin efforts at mobilization.

## 2016-06-11 NOTE — Evaluation (Addendum)
Physical Therapy Evaluation Patient Details Name: Erika Wong MRN: [id number] DOB: 05-May-1966 Today's Date: 06/11/2016   History of Present Illness  50 y.o. female [facility]/16 for clipping of left posterior communicating artery aneurysm.  Pt is s/p L froto-temporal crainiotomy for clipping of posterior communicating artery aneurysm on 06/09/16.  Pt with significant PMHX of stroke, HA, and HTN.  Clinical Impression  Pt was able to walk assisted with RW down the hallway, two person assit for safety and line management.  Pt is completely unaware of her deficits and has a hard time answering history questions accurately on the first attempt (sometimes she corrects herself, sometimes not).  Pt would benefit from inpatient rehab for further intensive therapy at discharge.   PT to follow acutely for deficits listed below.       Follow Up Recommendations CIR    Equipment Recommendations  Rolling walker with 5" wheels;3in1 (PT)    Recommendations for Other Services Speech consult (cognition/language eval)     Precautions / Restrictions Precautions Precautions: Fall Precaution Comments: mild R sided weakness, decreased awareness of deficits      Mobility  Bed Mobility Overal bed mobility: Needs Assistance Bed Mobility: Supine to Sit     Supine to sit: Min assist;HOB elevated     General bed mobility comments: Min hand held assist for leverage to pull up to sitting EOB.   Transfers Overall transfer level: Needs assistance Equipment used: Rolling walker (2 wheeled) Transfers: Sit to/from Stand Sit to Stand: +2 safety/equipment;Min guard (for lines)         General transfer comment: Min assist to steady pt for balance during transition to stand.  Verbal cues for safe hand placement.   Ambulation/Gait Ambulation/Gait assistance: Min guard Ambulation Distance (Feet): 80 Feet Assistive device: Rolling walker (2 wheeled) Gait Pattern/deviations: Step-through pattern  (leans right) Gait velocity: decreased Gait velocity interpretation: Below normal speed for age/gender General Gait Details: Pt with supinated right hand gripping RW making her trunk lean to the right.  No sigs of right knee buckling or right foot drag.        Modified Rankin (Stroke Patients Only) Modified Rankin (Stroke Patients Only) Pre-Morbid Rankin Score: No symptoms Modified Rankin: Moderately severe disability     Balance Overall balance assessment: Needs assistance Sitting-balance support: Feet supported;Bilateral upper extremity supported Sitting balance-Leahy Scale: Fair     Standing balance support: Bilateral upper extremity supported Standing balance-Leahy Scale: Poor                               Pertinent Vitals/Pain Pain Assessment: No/denies pain    Home Living Family/patient expects to be discharged to:: Private residence Living Arrangements: Spouse/significant other               Additional Comments: difficult historian.  Reported she didn't live with anyone, then later mentioned her spouse and when I asked again, reported she lives with her spouse.      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)     Hand Dominance   Dominant Hand: Right    Extremity/Trunk Assessment   Upper Extremity Assessment: Defer to OT evaluation           Lower Extremity Assessment: RLE deficits/detail RLE Deficits / Details: right leg with 4/5 strength    Cervical / Trunk  Assessment: Normal  Communication   Communication: Expressive difficulties (repeats words at times)  Cognition Arousal/Alertness: Awake/alert Behavior During Therapy: Flat affect Overall Cognitive Status: Impaired/Different from baseline Area of Impairment: Following commands;Safety/judgement;Awareness;Problem solving       Following Commands: Follows one step commands with increased  time;Follows multi-step commands inconsistently Safety/Judgement: Decreased awareness of safety;Decreased awareness of deficits Awareness: Intellectual Problem Solving: Slow processing;Difficulty sequencing;Requires verbal cues;Requires tactile cues General Comments: Pt when asked was not aware of her right sided weakness.               Assessment/Plan    PT Assessment Patient needs continued PT services  PT Diagnosis Difficulty walking;Abnormality of gait;Generalized weakness;Hemiplegia dominant side;Altered mental status   PT Problem List Decreased strength;Decreased activity tolerance;Decreased balance;Decreased mobility;Decreased cognition;Decreased coordination;Decreased knowledge of use of DME;Decreased safety awareness;Decreased knowledge of precautions;Obesity  PT Treatment Interventions DME instruction;Gait training;Stair training;Functional mobility training;Therapeutic activities;Therapeutic exercise;Balance training;Neuromuscular re-education;Cognitive remediation;Patient/family education   PT Goals (Current goals can be found in the Care Plan section) Acute Rehab PT Goals Patient Stated Goal: unable to state PT Goal Formulation: Patient unable to participate in goal setting Time For Goal Achievement: 06/25/16 Potential to Achieve Goals: Good    Frequency Min 4X/week    End of Session Equipment Utilized During Treatment: Gait belt Activity Tolerance: Patient limited by fatigue Patient left: in chair;with call bell/phone within reach;with chair alarm set Nurse Communication: Mobility status         Time: [id number] PT Time Calculation (min) (ACUTE ONLY): 16 min   Charges:   PT Evaluation $PT Eval Moderate Complexity: 1 Procedure          Drenda Sobecki B. Carollynn Pennywell, PT, DPT [id number]#[phone]   06/11/2016, 4:50 PM

## 2016-06-11 NOTE — Progress Notes (Signed)
Less agitated this morning. Patient complains of some headache. No other complaints. Awake and alert. Oriented and appropriate. Motor with very slight right hemiparesis is which continues to improve. Dressing dry.  She is afebrile. Her vitals are stable, bradycardia improving. Fluid balance positive.  Status post craniotomy for aneurysm. Overall stable. Continue observation and supportive care.

## 2016-06-11 NOTE — Progress Notes (Signed)
Posey restraint discontinued at 0900. Order to discontinue was not entered at that time. Patient resting comfortably in chair.

## 2016-06-11 NOTE — Progress Notes (Signed)
Rehab Admissions Coordinator Note:  Patient was screened by Clois DupesBoyette, Tonnie Stillman Godwin for appropriateness for an Inpatient Acute Rehab Consult per PT recommendation. .  At this time, we are recommending await further progress with therpay to determine if an inpt rehab admission will be needed.Clois Dupes.  Shaterica Mcclatchy Godwin 06/11/2016, 7:09 PM  I can be reached at [phone].

## 2016-06-12 DIAGNOSIS — Z8673 Personal history of transient ischemic attack (TIA), and cerebral infarction without residual deficits: Secondary | ICD-10-CM

## 2016-06-12 DIAGNOSIS — Z720 Tobacco use: Secondary | ICD-10-CM

## 2016-06-12 DIAGNOSIS — I671 Cerebral aneurysm, nonruptured: Principal | ICD-10-CM

## 2016-06-12 DIAGNOSIS — R0682 Tachypnea, not elsewhere classified: Secondary | ICD-10-CM | POA: Insufficient documentation

## 2016-06-12 DIAGNOSIS — I10 Essential (primary) hypertension: Secondary | ICD-10-CM | POA: Insufficient documentation

## 2016-06-12 DIAGNOSIS — R001 Bradycardia, unspecified: Secondary | ICD-10-CM | POA: Insufficient documentation

## 2016-06-12 LAB — POCT I-STAT 7, (LYTES, BLD GAS, ICA,H+H)
ACID-BASE DEFICIT: 4 mmol/L — AB (ref 0.0–2.0)
Acid-base deficit: 4 mmol/L — ABNORMAL HIGH (ref 0.0–2.0)
Acid-base deficit: 6 mmol/L — ABNORMAL HIGH (ref 0.0–2.0)
BICARBONATE: 22.6 meq/L (ref 20.0–24.0)
BICARBONATE: 21.4 meq/L (ref 20.0–24.0)
BICARBONATE: 18.9 meq/L — AB (ref 20.0–24.0)
CALCIUM ION: 1.13 mmol/L (ref 1.12–1.23)
CALCIUM ION: 1.15 mmol/L (ref 1.12–1.23)
Calcium, Ion: 1.16 mmol/L (ref 1.12–1.23)
HCT: 39 % (ref 36.0–46.0)
HCT: 40 % (ref 36.0–46.0)
HEMATOCRIT: 40 % (ref 36.0–46.0)
HEMOGLOBIN: 13.6 g/dL (ref 12.0–15.0)
Hemoglobin: 13.3 g/dL (ref 12.0–15.0)
Hemoglobin: 13.6 g/dL (ref 12.0–15.0)
O2 SAT: 88 %
O2 Saturation: 99 %
O2 Saturation: 99 %
PCO2 ART: 42.1 mmHg (ref 35.0–45.0)
PH ART: 7.343 — AB (ref 7.350–7.450)
PO2 ART: 131 mmHg — AB (ref 80.0–100.0)
POTASSIUM: 4.2 mmol/L (ref 3.5–5.1)
Patient temperature: 35.6
Patient temperature: 35.9
Potassium: 4.1 mmol/L (ref 3.5–5.1)
Potassium: 4.2 mmol/L (ref 3.5–5.1)
SODIUM: 140 mmol/L (ref 135–145)
SODIUM: 141 mmol/L (ref 135–145)
Sodium: 140 mmol/L (ref 135–145)
TCO2: 24 mmol/L (ref 0–100)
TCO2: 23 mmol/L (ref 0–100)
TCO2: 20 mmol/L (ref 0–100)
pCO2 arterial: 36.1 mmHg (ref 35.0–45.0)
pCO2 arterial: 34.3 mmHg — ABNORMAL LOW (ref 35.0–45.0)
pH, Arterial: 7.332 — ABNORMAL LOW (ref 7.350–7.450)
pH, Arterial: 7.375 (ref 7.350–7.450)
pO2, Arterial: 153 mmHg — ABNORMAL HIGH (ref 80.0–100.0)
pO2, Arterial: 54 mmHg — ABNORMAL LOW (ref 80.0–100.0)

## 2016-06-12 LAB — TYPE AND SCREEN
ABO/RH(D): A POS
Antibody Screen: NEGATIVE
Unit division: 0
Unit division: 0
Unit division: 0
Unit division: 0

## 2016-06-12 NOTE — Clinical Social Work Note (Signed)
Clinical Social Work Assessment  Patient Details  Name: Erika Wong MRN: [id number] Date of Birth: 1966-07-14  Date of referral:  06/12/16               Reason for consult:  Facility Placement                Permission sought to share information with:  Facility Medical sales representativeContact Representative, Family Supports Permission granted to share information::  No (Patient is disoriented)  Name::     Biochemist, clinicalJerry  Agency::  SNFs  Relationship::  Spouse  Contact Information:  [phone]  Housing/Transportation Living arrangements for the past 2 months:  Single Family Home Source of Information:  Spouse Patient Interpreter Needed:  None Criminal Activity/Legal Involvement Pertinent to Current Situation/Hospitalization:  No - Comment as needed Significant Relationships:  Spouse Lives with:  Spouse Do you feel safe going back to the place where you live?  No Need for family participation in patient care:  Yes (Comment)  Care giving concerns:  CSW received referral for possible SNF placement at time of discharge. Patient is disoriented. CSW spoke with patient's spouse regarding PT recommendation of SNF placement at time of discharge if CIR is unable to admit patient. Per patient's husband, he is currently unable to care for patient at their home given patient's current physical needs and fall risk. He is not able to pay privately for SNF if CIR is unable to admit. His job has reduced his hours by 16. Patient's spouse expressed understanding of PT recommendation and are agreeable to SNF placement at time of discharge. CSW to continue to follow and assist with discharge planning needs.   Social Worker assessment / plan:  CSW spoke with patient's [facility].  Employment status:  Other (Comment) Insurance information:  Self Pay (Medicaid Pending) PT Recommendations:  Inpatient Rehab Consult Information / Referral to community resources:  Skilled Nursing  Facility  Patient/Family's Response to care:  Patient's spouse recognizes need for rehab before returning home and may be agreeable to a SNF if CIR does not admit, but he would need financial assistance.  Patient/Family's Understanding of and Emotional Response to Diagnosis, Current Treatment, and Prognosis:  Patient is realistic regarding therapy needs. No questions/concerns about plan or treatment.    Emotional Assessment Appearance:  Appears stated age Attitude/Demeanor/Rapport:  Unable to Assess Affect (typically observed):  Unable to Assess Orientation:  Oriented to Self, Oriented to Place Alcohol / Substance use:  Not Applicable Psych involvement (Current and /or in the community):  No (Comment)  Discharge Needs  Concerns to be addressed:  Care Coordination Readmission within the last 30 days:  No Current discharge risk:  Inadequate Financial Supports Barriers to Discharge:  Continued Medical Work up   [employer] S Dvonte Gatliff, LCSWA 06/12/2016, 3:16 PM

## 2016-06-12 NOTE — Progress Notes (Signed)
Patient now progressing to 200 ft min assist with PT. Patient's spouse is unable to pay privately for SNF. CSW will sign off. Please re-consult if needed.   Erika Wong LCSWA [phone]

## 2016-06-12 NOTE — Progress Notes (Signed)
No issues overnight. Pt has no significant HA.   EXAM:  BP 120/73 mmHg  Pulse 65  Temp(Src) 98.2 F (36.8 C) (Oral)  Resp 23  Ht 5\' 3"  (1.6 m)  Wt 117.7 kg (259 lb 7.7 oz)  BMI 45.98 kg/m2  SpO2 94%  Awake, alert, oriented  Speech appropriate  CN grossly intact  5/5 LUE/LLE 4+/5 RUE/RLE Wound c/d/i  IMPRESSION:  50 y.o. female POD#3 s/p clipping large left Pcom aneurysm, minimal right hemiparesis, ?mild mixed aphasia  PLAN: - Will get SLP eval - Cont PT/OT, will consult PMR for possible CIR

## 2016-06-12 NOTE — Progress Notes (Signed)
OT Cancellation Note  Patient Details Name: Erika Wong MRN: [id number] DOB: 1966-01-16   Cancelled Treatment:    Reason Eval/Treat Not Completed: Patient at procedure or test/ unavailable. Will see in am.  [facility],HILLARY  Teddrick Mallari, OTR/L  [phone] 06/12/2016 06/12/2016, 4:49 PM

## 2016-06-12 NOTE — Consult Note (Signed)
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 Conchita Wong   HPI: Erika Wong is a 50 y.o. 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 Per chart review patient lives with spouse was independent prior to admission. Husband reportedly works day shift. She has a father and daughter in the area. Admitted 06/09/2016 and underwent left frontotemporal craniotomy for clipping of posterior communicating artery aneurysm per Dr. Conchita Wong. Maintained on Decadron protocol. Tolerating a regular diet. Keppra for seizure prophylaxis. Physical therapy evaluation completed 06/11/2016 with recommendations of physical medicine rehabilitation consult.   Review of Systems  Unable to perform ROS: medical condition   Past Medical History  Diagnosis Date  . Asthma   . Tobacco abuse   . Stroke (HCC)   . Headache     hx   Past Surgical History  Procedure Laterality Date  . Tee without cardioversion N/A 03/29/2016    Procedure: TRANSESOPHAGEAL ECHOCARDIOGRAM (TEE);  Surgeon: Vesta MixerPhilip J Nahser, MD;  [facility];  Service: Cardiovascular;  Laterality: N/A;  . Tubal ligation  5787   Family History  Problem Relation Age of Onset  . Congestive Heart Failure Mother     Died age 52  . Diabetes Mother   . Asthma Father   . Asthma Sister   . Asthma Brother   . COPD Father   . Diabetes Sister   . Heart attack Paternal Grandmother [id number]   Social History:  reports that she has been smoking Cigarettes.  She has a 17.5 pack-year smoking history. She has never used smokeless tobacco. She reports that she drinks alcohol. She reports that she uses illicit drugs (Marijuana). Allergies:  Allergies  Allergen Reactions  . Adhesive [Tape] Rash    Heart monitor  electrodes   . Codeine Other (See Comments)    "makes me feel funny"  . Hydrocodone Other (See Comments)    "Makes me feel funny"   Medications Prior to Admission  Medication Sig Dispense Refill  . aspirin 325 MG tablet Take 1 tablet (325 mg total) by mouth daily. 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  No external assist needed, just extra time to complete task.  Transfers Overall transfer level: Needs assistance Equipment used: Rolling walker (2 wheeled) Transfers: Sit to/from Stand, Stand Pivot Transfers Sit to Stand: +2 safety/equipment, Min assist Stand pivot transfers: +2 physical assistance, Min assist General transfer comment: Two person min assist for safety to power up to standing and to manage lines.  Verbal cues for safe hand placement.  Ambulation/Gait Ambulation/Gait assistance: Min guard Ambulation Distance (Feet): 200 Feet Assistive device: Rolling walker (  2 wheeled) Gait Pattern/deviations: Step-through pattern, Drifts right/left (lists to the right in the RW, right hand slides off ) General Gait Details: Pt continues to have supinated right hand when  walking holding the RW, self corrected x 1, but otherwise needed verbal cues to correct right hand placement.  Pt drifts to the right side of the walker during gait.  Second person used for line management and chair to follow (but not needed during gait) as pt did not sit to rest during gait.  Gait velocity: decreased Gait velocity interpretation: Below normal speed for age/gender    ADL:    Cognition: Cognition Overall Cognitive Status: Impaired/Different from baseline Orientation Level: Oriented to person, Disoriented to time, Disoriented to situation, Oriented to place Cognition Arousal/Alertness: Awake/alert Behavior During Therapy: WFL for tasks assessed/performed (much less flat today, especially interacting with family) Overall Cognitive Status: Impaired/Different from baseline Area of Impairment: Orientation, Following commands, Safety/judgement, Awareness, Problem solving Orientation Level: Disoriented to, Time, Situation Memory: Decreased short-term memory (did not remember walking with me yesterday) Following Commands: Follows one step commands consistently, Follows multi-step commands with increased time Safety/Judgement: Decreased awareness of safety, Decreased awareness of deficits Awareness: Intellectual (still does not recognize R hand weakness) Problem Solving: Slow processing, Difficulty sequencing, Requires verbal cues, Requires tactile cues General Comments: Pt continues to not be able to report right sided weakness, however, she did self correct hand placment on RW today without cues (once).  She also at times seems to get stuck on one word "Aundra MilletMegan" was the word today when asked what month it was.    Blood pressure 120/73, pulse 65, temperature 98.2 F (36.8 C), temperature source Oral, resp. rate 23, height 5\' 3"  (1.6 m), weight 117.7 kg (259 lb 7.7 oz), SpO2 94 %. Physical Exam  Vitals reviewed. Constitutional: She appears well-developed.  Obese  HENT:  Head:  Normocephalic.  Scalp incision  Eyes:  Left eye ptosis. Pupils round and reactive to light  Neck: Normal range of motion. Neck supple. No thyromegaly present.  Cardiovascular: Normal rate and regular rhythm.   Respiratory: Effort normal and breath sounds normal. No respiratory distress.  GI: Soft. Bowel sounds are normal. She exhibits no distension.  Musculoskeletal: She exhibits no edema or tenderness.  Neurological:  Global aphasia Lethargic but arousable.  Delay in processing.  Followed simple commands at times.  Limited awareness of deficits Unable to assess sensation Motor: >/4/5 throughout. Pt unable to perform finger to nose DTRs symmetric  Skin: Skin is warm and dry.  Psychiatric:  Altered mentation    No results found for this or any previous visit (from the past 24 hour(s)). No results found.  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  Records reviewed and summated above.  1. Does the need for close, 24 hr/day medical supervision in concert with the patient's rehab needs make it unreasonable for this patient to be served in a less intensive setting? Potentially  2. Co-Morbidities requiring supervision/potential complications:  tobacco abuse (counsel when appropriate), HTN (monitor and provide prns in accordance with increased physical exertion and pain), cerebellar infarct March 2017, tachypnea (monitor RR and O2 Sats with increased physical exertion), bradycardia (monitor with increased physical activity) 3. Due to safety, medication administration and patient education, does the patient require 24 hr/day rehab nursing? Yes 4. Does the patient require coordinated care of a physician, rehab nurse, PT (1-2 hrs/day, 5 days/week), OT (1-2 hrs/day, 5 days/week) and SLP (1-2 hrs/day, 5  days/week) to address physical and functional deficits in the context of the above medical diagnosis(es)? Yes Addressing  deficits in the following areas: balance, endurance, locomotion, transferring, toileting, cognition, speech, language and psychosocial support 5. Can the patient actively participate in an intensive therapy program of at least 3 hrs of therapy per day at least 5 days per week? Yes 6. The potential for patient to make measurable gains while on inpatient rehab is good 7. Anticipated functional outcomes upon discharge from inpatient rehab are modified independent and supervision  with PT, modified independent with OT, supervision and min assist with SLP. 8. Estimated rehab length of stay to reach the above functional goals is: 15-19 days. 9. Does the patient have adequate social supports and living environment to accommodate these discharge functional goals? Potentially 10. Anticipated D/C setting: Home 11. Anticipated post D/C treatments: HH therapy and Home excercise program 12. Overall Rehab/Functional Prognosis: good  RECOMMENDATIONS: This patient's condition is appropriate for continued rehabilitative care in the following setting: Pt relatively high functioning and making significant progress day to day.  After her acute medical issues stablize, she will likely be even higher functioning and may not have a 2 therapy need nor a medical need. Patient has agreed to participate in recommended program. Potentially Note that insurance prior authorization may be required for reimbursement for recommended care.  Comment: Rehab Admissions Coordinator to follow up.  Maryla Morrow, MD 06/12/2016

## 2016-06-12 NOTE — Progress Notes (Signed)
Pt progressing very well and now min guard assist 200 feet with RW. I will discuss with pt and family tomorrow that unlikely will need an inpt rehab admission at this level. [phone]

## 2016-06-12 NOTE — Progress Notes (Signed)
Physical Therapy Treatment Patient Details Name: Erika Wong MRN: [id number] DOB: [birth year] Today's Date: 06/12/2016    History of Present Illness 50 y.o. female [facility] for clipping of left posterior communicating artery aneurysm.  Pt is s/p L froto-temporal crainiotomy for clipping of posterior communicating artery aneurysm on 06/09/16.  Pt with significant PMHX of stroke, HA, and HTN.    PT Comments    Pt is progressing with her mobility, however, her safety awareness and awareness of deficits make her a higher fall risk.  I would like to progress to a cane or no AD next session.  I continue to recommend SLP for cognitive/language eval.  MD paged with rec.  PT to follow acutely until d/c confirmed.     Follow Up Recommendations  CIR     Equipment Recommendations  Rolling walker with 5" wheels;3in1 (PT)    Recommendations for Other Services Speech consult (for cog/language eval)     Precautions / Restrictions Precautions Precautions: Fall Precaution Comments: mild R sided weakness, decreased awareness of deficits    Mobility  Bed Mobility Overal bed mobility: Needs Assistance Bed Mobility: Supine to Sit     Supine to sit: Supervision;HOB elevated     General bed mobility comments: Supervision for safety as pt is still using bed rail to pull up to sitting.  No external assist needed, just extra time to complete task.   Transfers Overall transfer level: Needs assistance Equipment used: Rolling walker (2 wheeled) Transfers: Sit to/from UGI CorporationStand;Stand Pivot Transfers Sit to Stand: +2 safety/equipment;Min assist Stand pivot transfers: +2 physical assistance;Min assist       General transfer comment: Two person min assist for safety to power up to standing and to manage lines.  Verbal cues for safe hand placement.   Ambulation/Gait Ambulation/Gait assistance: Min guard Ambulation Distance (Feet): 200 Feet Assistive device: Rolling walker (2  wheeled) Gait Pattern/deviations: Step-through pattern;Drifts right/left (lists to the right in the RW, right hand slides off ) Gait velocity: decreased Gait velocity interpretation: Below normal speed for age/gender General Gait Details: Pt continues to have supinated right hand when walking holding the RW, self corrected x 1, but otherwise needed verbal cues to correct right hand placement.  Pt drifts to the right side of the walker during gait.  Second person used for line management and chair to follow (but not needed during gait) as pt did not sit to rest during gait.     Modified Rankin (Stroke Patients Only) Modified Rankin (Stroke Patients Only) Pre-Morbid Rankin Score: No symptoms Modified Rankin: Moderately severe disability     Balance Overall balance assessment: Needs assistance Sitting-balance support: Feet supported;No upper extremity supported Sitting balance-Leahy Scale: Good     Standing balance support: Bilateral upper extremity supported;No upper extremity supported;Single extremity supported Standing balance-Leahy Scale: Fair                      Cognition Arousal/Alertness: Awake/alert Behavior During Therapy: WFL for tasks assessed/performed (much less flat today, especially interacting with family) Overall Cognitive Status: Impaired/Different from baseline Area of Impairment: Orientation;Following commands;Safety/judgement;Awareness;Problem solving Orientation Level: Disoriented to;Time;Situation   Memory: Decreased short-term memory (did not remember walking with me yesterday) Following Commands: Follows one step commands consistently;Follows multi-step commands with increased time Safety/Judgement: Decreased awareness of safety;Decreased awareness of deficits Awareness: Intellectual (still does not recognize R hand weakness) Problem Solving: Slow processing;Difficulty sequencing;Requires verbal cues;Requires tactile cues General Comments: Pt  continues to not be able to  report right sided weakness, however, she did self correct hand placment on RW today without cues (once).  She also at times seems to get stuck on one word "Erika MilletMegan" was the word today when asked what month it was.             Pertinent Vitals/Pain Pain Assessment: No/denies pain    Home Living Family/patient expects to be discharged to:: Private residence Living Arrangements: Spouse/significant other Available Help at Discharge: Family;Available 24 hours/day (brothers and husband) Type of Home: House Home Access: Ramped entrance (with one STE)   Home Layout: One level Home Equipment: Tub bench Additional Comments: Husband and brother reporting today        PT Goals (current goals can now be found in the care plan section) Acute Rehab PT Goals Patient Stated Goal: to get better, husband would like to try for rehab if able Progress towards PT goals: Progressing toward goals    Frequency  Min 4X/week    PT Plan Current plan remains appropriate       End of Session Equipment Utilized During Treatment: Gait belt Activity Tolerance: Patient limited by fatigue Patient left: in chair;with call bell/phone within reach;with chair alarm set;with family/visitor present     Time: [id number] PT Time Calculation (min) (ACUTE ONLY): 43 min  Charges:  $Gait Training: 8-22 mins $Therapeutic Activity: 23-37 mins                      Clemens Lachman B. Abhijay Morriss, PT, DPT [phone]   06/12/2016, 2:34 PM

## 2016-06-13 ENCOUNTER — Encounter (HOSPITAL_COMMUNITY): Payer: Self-pay | Admitting: Neurosurgery

## 2016-06-13 NOTE — Progress Notes (Signed)
Patient transferred to [id number].

## 2016-06-13 NOTE — Evaluation (Addendum)
Speech Language Pathology Evaluation Patient Details Name: Erika Wong MRN: [id number] DOB: 05-19-1966 Today's Date: 06/13/2016 Time: [id number] SLP Time Calculation (min) (ACUTE ONLY): 15 min  Problem List:  Patient Active Problem List   Diagnosis Date Noted  . Benign essential HTN   . Bradycardia   . Tachypnea   . 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 Right sided cerebral infarction (HCC) 04/23/2016  . Transient cerebral ischemia   . History of CVA (cerebrovascular accident) 03/27/2016  . Prediabetes 03/27/2016  . Acute CVA (cerebrovascular accident) (HCC) 03/10/2016  . Aneurysm, cerebral, nonruptured 03/10/2016  . Obesity 03/10/2016  . Asthma 03/09/2016  . UTI (lower urinary tract infection) 03/09/2016  . Tobacco abuse    Past Medical History:  Past Medical History  Diagnosis Date  . Asthma   . Tobacco abuse   . Stroke (HCC)   . Headache     hx   Past Surgical History:  Past Surgical History  Procedure Laterality Date  . Tee without cardioversion N/A 03/29/2016    Procedure: TRANSESOPHAGEAL ECHOCARDIOGRAM (TEE);  Surgeon: Vesta MixerPhilip J Nahser, MD;  [facility];  Service: Cardiovascular;  Laterality: N/A;  . Tubal ligation  1587   HPI:  50 y.o. [facility] for clipping of left posterior communicating artery aneurysm. Pt is s/p L froto-temporal crainiotomy for clipping of posterior communicating artery aneurysm on 06/09/16. Pt with significant PMHX of stroke, HA, and HTN.   Assessment / Plan / Recommendation Clinical Impression  Pt has cognitive-linguistic deficits that include impaired selective attention, decreased storage and subsequently retrieval of new information, and limited insight into acute deficits, which impacts safety awareness. Upon completion of evaluation, pt attempts to get up by herself to use the bathroom. Sitter present to assist. Mod cues needed for recall of 4 words after five-minute interval. Her speech is slow and  monotonous, with perseveration noted primarily with open ended questions. Confrontational naming was [facility]FL across trials, but during divergent naming task she was only able to produce 4 words within a given category. Pt will need additional SLP f/u to maximize cognitive-linguistic recovery and pt safety.  Of note: pt observed coughing after trials of thin liquids while SLP was in room. Corporate treasureritter and RN both deny any other episodes of coughing with PO intake this morning, and when asked to drink additional liquids there were no further overt signs of aspiration observed. Sitter/RN to monitor.    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)   Cognition  Overall Cognitive Status: Impaired/Different from baseline Arousal/Alertness: Awake/alert Orientation Level: Oriented X4 Attention: Selective Selective Attention: Impaired Selective Attention Impairment: Verbal basic Memory: Impaired Memory Impairment: Storage deficit;Retrieval deficit;Decreased recall of new information Awareness: Impaired Awareness Impairment: Intellectual impairment;Emergent impairment;Anticipatory impairment Problem Solving: Impaired Problem Solving Impairment: Verbal basic Safety/Judgment: Impaired    Comprehension       Expression Expression Primary Mode of Expression: Verbal Verbal Expression Overall Verbal Expression: Impaired Initiation: No impairment Level of Generative/Spontaneous Verbalization: Phrase;Sentence Naming: Impairment Confrontation: Within functional  limits Divergent: 25-49% accurate Verbal Errors: Perseveration Pragmatics: Impairment Impairments: Abnormal affect;Eye contact;Monotone Non-Verbal  Means of Communication: Not applicable   Oral / Motor  Motor Speech Overall Motor Speech:  (speech slower, monotonous)   GO                   Maxcine HamLaura Paiewonsky, M.A. CCC-SLP [phone]  Maxcine Hamaiewonsky, [name] 06/13/2016, 9:46 AM

## 2016-06-13 NOTE — Care Management Note (Addendum)
Case Management Note  Patient Details  Name: Erika Wong MRN: [id number] Date of Birth: 01-Jul-1966  Subjective/Objective:     Left fronto-temporal craniotomy for clipping of posterior communicating artery aneurysm               Action/Plan: Discharge Planning:  NCM spoke to pt and husband, Erika Wong [phone] at bedside. Explained pt is not candidate for IP rehab or will have to pay out of pocket for SNF. Husband [facility]. Explained [facility] will follow up to see if she qualifies for their charity program. Husband states she was not on any medications except ASA at home. [facility] clinic) sees Dr Hart RochesterHollis on 06/26/2016 1:30 pm., explained the importance of keeping appt. Pt will need RW and 3n1 prior to dc. Will have dc NCM order DME with [facility] (agency will deliver to room prior to dc home). Pt scheduled to transfer to 5c. [facility]   PCP Julianne HandlerLachina Hollis MD ([facility])    Expected Discharge Date:                 Expected Discharge Plan:  Home w Home Health Services  In-House Referral:  Clinical Social Work  Discharge planning Services  CM Consult, Follow-up appt scheduled, Medication Assistance  Post Acute Care Choice:  Home Health Choice offered to:  Spouse  DME Arranged:  3-N-1, Walker rolling DME Agency:  [facility]  HH Arranged:  PT, Speech Therapy, Nurse's Aide HH Agency:  [facility]  Status of Service:  In process, will continue to follow  If discussed at Long Length of Stay Meetings, dates discussed:    Additional Comments:  Elliot CousinShavis, Con Arganbright Ellen, RN 06/13/2016, 12:00 PM

## 2016-06-13 NOTE — Progress Notes (Signed)
Pt not at a level to need an intense inpt rehab admission at this time. Recommend 24/7 assist of family and HH or OP therapy at this level. [phone]

## 2016-06-13 NOTE — Progress Notes (Signed)
Occupational Therapy Evaluation Patient Details Name: Erika Wong MRN: [id number] DOB: [birth year] Today's Date: 06/13/2016    History of Present Illness 50 y.o. 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  Pt is s/p L froto-temporal crainiotomy for clipping of posterior communicating artery aneurysm on 06/09/16.  Pt with significant PMHX of stroke, HA, and HTN.   Clinical Impression   PTA, pt independent with ADL and mobility and worked with [employer] after[employer] program. Pt perseverating during session, is easily distracted, has difficulty completing tasks, demonstrates poor safety awareness/judgement and awareness of deficits and demonstrates generalized RUE weakness and R inattention. Pt would benefit from rehab at CIR, however, since pt does not qualify for CIR, she will need 24/7 S and  HHOT. Pt will most likely need to transition to outpt OT to facilitate her return to PLOF, including driving and work. Will follow acutely to facilitate safe D/C home with 24/7 S.     Follow Up Recommendations  CIR;Supervision/Assistance - 24 hour    Equipment Recommendations  3 in 1 bedside comode    Recommendations for Other Services       Precautions / Restrictions Precautions Precautions: Fall Restrictions Weight Bearing Restrictions: No      Mobility Bed Mobility Overal bed mobility: Needs Assistance             General bed mobility comments: S  Transfers Overall transfer level: Needs assistance     Sit to Stand: Min guard              Balance     Sitting balance-Leahy Scale: Good       Standing balance-Leahy Scale: Fair                              ADL Overall ADL's : Needs assistance/impaired     Grooming: Set up;Supervision/safety;Standing   Upper Body Bathing: Supervision/ safety;Set up;Sitting   Lower Body Bathing: Minimal assistance;Sit to/from stand   Upper Body Dressing : Minimal  assistance;Sitting   Lower Body Dressing: Minimal assistance;Sit to/from stand   Toilet Transfer: Minimal assistance;BSC;Ambulation   Toileting- Clothing Manipulation and Hygiene: Set up;Sit to/from stand       Functional mobility during ADLs: Minimal assistance General ADL Comments: Pt demonstrate R inattention during functional tasks. Pt is R handed, but continued to reach and try to use KL hand for most activities.      Vision Additional Comments: will further assess. Decreased visual attention   Perception     Praxis Praxis Praxis tested?: Deficits Deficits: Perseveration;Initiation    Pertinent Vitals/Pain Pain Assessment: No/denies pain     Hand Dominance Right   Extremity/Trunk Assessment Upper Extremity Assessment Upper Extremity Assessment: RUE deficits/detail RUE Deficits / Details: generalized RUE weakness. Decreased iin hand manipulation and fine motor/coordination skills RUE Coordination: decreased fine motor;decreased gross motor   Lower Extremity Assessment Lower Extremity Assessment: Defer to PT evaluation   Cervical / Trunk Assessment Cervical / Trunk Assessment: Normal   Communication Communication Communication: Expressive difficulties   Cognition Arousal/Alertness: Awake/alert Behavior During Therapy: Flat affect Overall Cognitive Status: Impaired/Different from baseline Area of Impairment: Attention;Memory;Following commands;Safety/judgement;Awareness;Problem solving   Current Attention Level: Sustained Memory: Decreased short-term memory Following Commands: Follows one step commands consistently Safety/Judgement: Decreased awareness of safety;Decreased awareness of deficits Awareness: Intellectual Problem Solving: Slow processing;Decreased initiation;Requires verbal cues General Comments: Pt perseverating on answers during session. Distracted at times by external stimuli.  Pt given 2 step task to complet and pt only completed 1/2. When asked  "what are you suppose to do next?", pt responded, "I don't know", then perseverated on "I don't know". Pt unable to give correct response when presesnted with choices.   General Comments       Exercises       Shoulder Instructions      Home Living Family/patient expects to be discharged to:: Private residence Living Arrangements: Spouse/significant other Available Help at Discharge: Family;Available 24 hours/day (brothers and husband) Type of Home: House Home Access: Ramped entrance (with one STE)     Home Layout: One level     Bathroom Shower/Tub: Chief Strategy OfficerTub/shower unit   Bathroom Toilet: Standard     Home Equipment: Tub bench   Additional Comments: Husband and brother reporting today      Prior Functioning/Environment Level of Independence: Independent        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    OT Diagnosis: Generalized weakness;Cognitive deficits;Disturbance of vision   OT Problem List: Decreased strength;Decreased activity tolerance;Impaired balance (sitting and/or standing);Impaired vision/perception;Decreased coordination;Decreased cognition;Decreased safety awareness;Decreased knowledge of use of DME or AE;Decreased knowledge of precautions;Obesity;Impaired UE functional use   OT Treatment/Interventions: Self-care/ADL training;Therapeutic exercise;Neuromuscular education;Energy conservation;DME and/or AE instruction;Therapeutic activities;Cognitive remediation/compensation;Visual/perceptual remediation/compensation;Patient/family education;Balance training    OT Goals(Current goals can be found in the care plan section) Acute Rehab OT Goals Patient Stated Goal: Pt unable to staes. Keep repeating "i don't know" Time For Goal Achievement: 06/27/16 Potential to Achieve Goals: Good  OT Frequency: Min 3X/week   Barriers to D/C:            Co-evaluation              End of Session Equipment Utilized During Treatment: Gait belt Nurse Communication:  Mobility status  Activity Tolerance: Patient tolerated treatment well Patient left: in chair;with call bell/phone within reach;with nursing/sitter in room   Time: 1445-1520 OT Time Calculation (min): 35 min Charges:  OT General Charges $OT Visit: 1 Procedure OT Evaluation $OT Eval Moderate Complexity: 1 Procedure OT Treatments $Self Care/Home Management : 8-22 mins G-Codes:    Ronnel Zuercher,HILLARY 06/13/2016, 5:15 PM   [facility] Zulay Corrie, OTR/L  [phone] 06/13/2016

## 2016-06-13 NOTE — Progress Notes (Signed)
Physical Therapy Treatment Patient Details Name: Erika Wong MRN: [id number] DOB: 06/18/1966 Today's Date: 06/13/2016    History of Present Illness 50 y.o. 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  Pt is s/p L froto-temporal crainiotomy for clipping of posterior communicating artery aneurysm on 06/09/16.  Pt with significant PMHX of stroke, HA, and HTN.    PT Comments    Pt could answer questions better today, but not saying as much as she was yesterday. Physical she is getting stronger. She progressed to using no assistive device, but stopped due to being light headed and having nausea. Pt and family were told that she did not qualify for CIR. Pt would benefit from continued PT in order to improve her functional ability.  Follow Up Recommendations  Home health PT     Equipment Recommendations  3in1 (PT)    Recommendations for Other Services Speech consult     Precautions / Restrictions Precautions Precautions: Fall Precaution Comments: mild R sided weakness, decreased awareness of deficits Restrictions Weight Bearing Restrictions: No    Mobility   Transfers Overall transfer level: Modified independent Equipment used: Straight cane Transfers: Sit to/from Stand Sit to Stand: Min guard;+2 safety/equipment         General transfer comment: Two person min guard for safety to manage lines.   Ambulation/Gait Ambulation/Gait assistance: Min guard;+2 safety/equipment Ambulation Distance (Feet): 50 Feet Assistive device: Straight cane (started with cane but progressed to none) Gait Pattern/deviations: Step-through pattern;Drifts right/left Gait velocity: decreased Gait velocity interpretation: Below normal speed for age/gender General Gait Details: Pt started with a straight cane but it became an obstacle she progressed to using nothing.  Pt began feeling light headed and slight nausea. Second person used for line  management and chair to follow and needed to sit down due to lightheadedness. Orthostatic postive BP taken, see vitals.        Modified Rankin (Stroke Patients Only) Modified Rankin (Stroke Patients Only) Pre-Morbid Rankin Score: No symptoms Modified Rankin: Moderately severe disability     Balance Overall balance assessment: Needs assistance Sitting-balance support: Feet supported;No upper extremity supported Sitting balance-Leahy Scale: Good     Standing balance support: No upper extremity supported;During functional activity Standing balance-Leahy Scale: Good                      Cognition Arousal/Alertness: Awake/alert Behavior During Therapy: Flat affect (Better able to answer questions but flat today) Overall Cognitive Status: Impaired/Different from baseline Area of Impairment: Attention;Memory;Safety/judgement;Problem solving       Following Commands: Follows one step commands consistently;Follows multi-step commands with increased time Safety/Judgement: Decreased awareness of safety;Decreased awareness of deficits Awareness: Intellectual Problem Solving: Slow processing;Requires verbal cues General Comments: Pt seemed to be able to understand what was being asked of her and able to answer correctly. Pt was not as talkitive today and tends to use short phrases when speaking. Husband stated her flat affect is not normal for her personality.           Pertinent Vitals/Pain Pain Assessment: No/denies pain           PT Goals (current goals can now be found in the care plan section) Progress towards PT goals: Progressing toward goals    Frequency  Min 4X/week    PT Plan Current plan remains appropriate       End of Session Equipment Utilized During Treatment: Gait belt Activity Tolerance: Other (comment) (Pt limited by  dizziness) Patient left: in chair;with call bell/phone within reach;with family/visitor present     Time: 1217-1245 PT Time  Calculation (min) (ACUTE ONLY): 28 min  Charges:  $Therapeutic Exercise: 8-22 mins $Therapeutic Activity: 8-22 mins                    G CodesKevan Ny:      Deidrea Gaetz, SPTA #[phone]   06/13/2016, 2:35 PM

## 2016-06-13 NOTE — Progress Notes (Signed)
No issues overnight. Pt has no c/o HA today. Ambulated well with PT yesterday.  EXAM:  BP 122/67 mmHg  Pulse 50  Temp(Src) 97.7 F (36.5 C) (Oral)  Resp 23  Ht 5\' 3"  (1.6 m)  Wt 117.7 kg (259 lb 7.7 oz)  BMI 45.98 kg/m2  SpO2 96%  Awake, alert, oriented  Speech fluent, appropriate  CN grossly intact, mild left ptosis  5/5 LUE/LLE 4+/5 RUE/RLE Wound c/d/i  IMPRESSION:  50 y.o. female POD#4 s/p clipping of left Pcom aneurysm, progressing well  PLAN: - will transfer to floor. - cont PT/OT, SLP - d/c planning - ?CIR

## 2016-06-14 NOTE — Care Management Note (Signed)
Case Management Note  Patient Details  Name: Erika Wong MRN: [id number] Date of Birth: Apr 18, 1966  Subjective/Objective:                    Action/Plan: Pt with orders for rolling walker and 3 in 1. CM met with the patient and she does want this equipment. CM notified [facility] and the equipment will be delivered to the room. Pt already has follow up appointment with the [facility] at discharge. CM will continue to follow for further d/c needs.   Expected Discharge Date:                  Expected Discharge Plan:  [place]  In-House Referral:  Clinical Social Work  Discharge planning Services  CM Consult, Follow-up appt scheduled, Medication Assistance  Post Acute Care Choice:  Home Health Choice offered to:  Spouse  DME Arranged:  3-N-1, Walker rolling DME Agency:  [place]:  PT, Speech Therapy, Nurse's Aide [place] Agency:  [place]  Status of Service:  In process, will continue to follow  If discussed at Long Length of Stay Meetings, dates discussed:    Additional Comments:  Pollie Friar, RN 06/14/2016, 10:46 AM

## 2016-06-14 NOTE — Progress Notes (Signed)
No issues overnight. Pt reports minimal HA. Doing well with PT.  EXAM:  BP 122/58 mmHg  Pulse 59  Temp(Src) 97.8 F (36.6 C) (Oral)  Resp 18  Ht 5\' 3"  (1.6 m)  Wt 117.7 kg (259 lb 7.7 oz)  BMI 45.98 kg/m2  SpO2 97%  Awake, alert, oriented  Speech fluent, appropriate  CN grossly intact  5/5 LUE/LLE 4+/5 RUE/RLE  IMPRESSION:  50 y.o. female s/p clipping of L Pcom, mild right weakness  PLAN: - Cont therapy - May consider d/c tomorrow or Friday home with supervision and home PT, outpatient OT

## 2016-06-14 NOTE — Progress Notes (Addendum)
Occupational Therapy Treatment Patient Details Name: Esaw GrandchildLeah Borthwick MRN: [id number] DOB: 08/21/1966 Today's Date: 06/14/2016    History of present illness 50 [facility]. 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  Pt is s/p L froto-temporal crainiotomy for clipping of posterior communicating artery aneurysm on 06/09/16.  Pt with significant PMHX of stroke, HA, and HTN.   OT comments  Pt with significant cognitive deficits, in addition to R inattention and R U/LE weakness and apparent visual deficits affecting her ability to function safely with ADL. On entry to room pt unaware of having BM on sheets and gown. When prompted to get OOB, pt required vc to get OOB on R side of bed where bed rail was down rather then trying to figure out how to go over L bedrail.  Required min physical assist  And moderate verbal cuing to complete ADL tasks due to difficulty with initiation, insight/judgemetn, awareness, attention to task and sequencing. Flat affect. R inattention noted throughout ADL tasks. Pt reports complaints of vision being "edgey". Pt self corrected LOB in room and reaching for rail to hold onto in hallway due to pt feeling "unsteady". Apparent RU/LE weakness. Continue to recommend CIR for rehab due to apparent deficits listed below. If rehab is not an option, pt will need 24/7 S and outpt OT at the neuro outpt center.   Follow Up Recommendations  CIR;Supervision/Assistance - 24 hour ; if not option, then neuro outpt OT   Equipment Recommendations  3 in 1 bedside comode    Recommendations for Other Services Rehab consult    Precautions / Restrictions Precautions Precautions: Fall       Mobility Bed Mobility Overal bed mobility: Needs Assistance             General bed mobility comments: S - cues to get out on side where bed rail was down (Pt trying to get OOB on side of bed with bedrail up)  Transfers Overall transfer level: Needs  assistance   Transfers: Sit to/from Stand;Stand Pivot Transfers Sit to Stand: Supervision Stand pivot transfers: Min guard            Balance Overall balance assessment: Needs assistance           Standing balance-Leahy Scale: Fair Standing balance comment: LOB in room when walking; able to self correct. Pt reaching for rail in hall. Balance appears affected by inattention                   ADL Overall ADL's : Needs assistance/impaired     Grooming: Set up;Supervision/safety;Standing   Upper Body Bathing: Supervision/ safety;Set up;Sitting   Lower Body Bathing: Minimal assistance;Sit to/from stand (unable to reach R foot/lower leg.)   Upper Body Dressing : Minimal assistance;Sitting   Lower Body Dressing: Minimal assistance;Sit to/from stand   Toilet Transfer: Minimal assistance;Grab bars;Comfort height toilet Toilet Transfer Details (indicate cue type and reason): unsafe during ambulation to toilet. Pt reaching for door handlees. when asked why she was holding on she sates "I just feel unsteady" Toileting- Clothing Manipulation and Hygiene: Minimal assistance;Sit to/from stand Toileting - Clothing Manipulation Details (indicate cue type and reason): Pt soiled with BM on sheets, gown, pad and unaware of being soiled. after peeing, pt began to stand up and stated she was finished, however, she was having a BM. After haing a BM, pt began to walk away from the toilet without wiping/cleaning herself.      Functional mobility during  ADLs: Min guard;Cueing for safety;Cueing for sequencing General ADL Comments: Unable to clena self after toileting with dominant R hand. VC to thouroughly complete tasks. Although she is weak with her RU/LE, pt attempting to stand on 1 leg to donn panties.       Vision                 Additional Comments: Pt states she wears glasses. states her vision is different because it is "edgey"   Perception     Praxis      Cognition    Behavior During Therapy: Flat affect Overall Cognitive Status: Impaired/Different from baseline Area of Impairment: Attention;Memory;Following commands;Safety/judgement;Awareness;Problem solving   Current Attention Level: Selective Memory: Decreased recall of precautions;Decreased short-term memory  Following Commands: Follows one step commands consistently Safety/Judgement: Decreased awareness of safety;Decreased awareness of deficits Awareness: Emergent Problem Solving: Decreased initiation;Slow processing;Difficulty sequencing;Requires verbal cues General Comments: Less perseveratioin today. Pt able to follow 1 step commands. Externally distracted in unstructured environments. Unaware of being soiled with BM over her sheets and gown. Poor awareness of deficits.    Extremity/Trunk Assessment   RUE weakness. Difficulty with in hand manipulations skills. Frequently dropping items. Requires vc to use R dominant UE in functional tasks            Exercises     Shoulder Instructions       General Comments      Pertinent Vitals/ Pain       Pain Assessment: Faces Faces Pain Scale: Hurts a little bit Pain Location: head Pain Descriptors / Indicators: Grimacing Pain Intervention(s): Limited activity within patient's tolerance  Home Living                                          Prior Functioning/Environment              Frequency Min 3X/week     Progress Toward Goals  OT Goals(current goals can now be found in the care plan section)  Progress towards OT goals: Progressing toward goals  Acute Rehab OT Goals Patient Stated Goal: none stated Time For Goal Achievement: 06/27/16 Potential to Achieve Goals: Good ADL Goals Pt/caregiver will Perform Home Exercise Program: Increased strength;Right Upper extremity;With written HEP provided;With theraputty;With Supervision Additional ADL Goal #1: Pt will demosntrate ability to initiate and complete ADL  task with S in minimally distracting environmentt Additional ADL Goal #2: pt/family will verbalize understanding of need for S with all IADL tasks  Plan Discharge plan remains appropriate    Co-evaluation                 End of Session Equipment Utilized During Treatment: Gait belt   Activity Tolerance Patient tolerated treatment well   Patient Left in chair;with call bell/phone within reach   Nurse Communication Mobility status;Other (comment) (safety concerns)        Time: [id number] OT Time Calculation (min): 34 min  Charges: OT General Charges $OT Visit: 1 Procedure OT Treatments $Self Care/Home Management : 23-37 mins  Lavaughn Haberle,HILLARY 06/14/2016, 3:49 PM   [facility] Zaide Kardell, OTR/L  [phone] 06/14/2016

## 2016-06-15 MED ORDER — LEVETIRACETAM 500 MG PO TABS: 500.0000 mg | ORAL_TABLET | Freq: Two times a day (BID) | ORAL | Status: DC

## 2016-06-15 MED ORDER — ASPIRIN 325 MG PO TABS: 325.0000 mg | ORAL_TABLET | Freq: Every day | ORAL | Status: DC

## 2016-06-15 NOTE — Care Management (Signed)
Pt discharged home today with walker and 3 in 1. [facility] Lupita [facility]Donna [facility] notified [facility].

## 2016-06-15 NOTE — Progress Notes (Signed)
Speech Language Pathology Treatment: Dysphagia  Patient Details Name: Erika Wong MRN: [id number] DOB: 1966-10-20 Today's Date: 06/15/2016 Time: [id number] SLP Time Calculation (min) (ACUTE ONLY): 16 min  Assessment / Plan / Recommendation Clinical Impression  Pt seen for cognitive treatment getting closer to discharge per chart. Pt's awareness to cognitive-language difficulty decreased and pt required moderate assist in conversation to identify impairments and anticipate needs/barriers. Educated re: memory strategies. One cue needed for divergent naming (7/8 spontaneous). Pt affirms word finding deficits and sister states she "is not talking as much". SLP provided clinical reasoning for site of surgery and language. Recommend pt continue ST for language and cognition from home health services.     HPI HPI: 50 y.o. 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 Pt is s/p L froto-temporal crainiotomy for clipping of posterior communicating artery aneurysm on 06/09/16. Pt with significant PMHX of stroke, HA, and HTN.      SLP Plan  Continue with current plan of care     Recommendations   Home Health ST             Oral Care Recommendations: Oral care BID Follow up Recommendations: Home health SLP Plan: Continue with current plan of care     GO                Erika Wong, Erika Wong 06/15/2016, 2:55 PM  Erika Wong M.Ed [employer]SLP Pager [phone]

## 2016-06-15 NOTE — Discharge Summary (Signed)
Physician Discharge Summary  Patient ID: Erika GrandchildLeah Wong MRN: [id number] DOB/AGE: 52/20/1967 52 y.o.  Admit date: 06/09/2016 Discharge date: 06/15/2016  Admission Diagnoses: Cerebral aneurysm  Discharge Diagnoses: Same Active Problems:   Cerebral aneurysm, nonruptured   Benign essential HTN   Bradycardia   Tachypnea   Discharged Condition: Stable  Hospital Course:  Mrs. Erika Wong is a 50 y.o. female admitted after elective clipping of right Pcom aneurysm. She had mild right weakness postop which did improve somewhat. She was ambulating with minimal assistance and was evaluated by PT, OT, and SLP. She appeared to be safe for d/c home with family supervision and home PT and SLP. She was tolerating diet with minimal pain.  Treatments: Surgery - left craniotomy for clipping of aneurysm  Discharge Exam: Blood pressure 120/66, pulse 62, temperature 98.5 F (36.9 C), temperature source Oral, resp. rate 18, height 5\' 3"  (1.6 m), weight 117.7 kg (259 lb 7.7 oz), SpO2 99 %. Awake, alert, oriented Speech fluent, appropriate CN grossly intact 5/5 LUE/LLE 4+/5 RUE/RLE Wound c/d/i  Disposition: 01-Home or Self Care  Discharge Instructions    Ambulatory referral to Home Health    Complete by:  As directed   Please evaluate Erika Wong for [facility].  Disciplines requested: Physical Therapy and Speech Language Pathology  Services to provide: Strengthening Exercises and Evaluate  Physician to follow patient's care (the person listed here will be responsible for signing ongoing orders): Referring Provider  Requested Start of Care Date: Tomorrow  I certify that this patient is under my care and that I, or a Nurse Practitioner or Physician's Assistant working with me, had a face-to-face encounter that meets the physician face-to-face requirements with patient on 06/15/2016. The encounter with the patient was in whole, or in part for the following medical condition(s) which  is the primary reason for home health care (List medical condition): cerebral aneurysm, right hemiparesis  Does the patient have Medicare or Medicaid?:  No  The encounter with the patient was in whole, or in part, for the following medical condition, which is the primary reason for home health care:  cerebral aneurysm  Reason for Medically Necessary Home Health Services:  Therapy- Therapeutic Exercises to Increase Strength and Endurance  My clinical findings support the need for the above services:  Unable to leave home safely without assistance and/or assistive device  I certify that, based on my findings, the following services are medically necessary home health services:   Physical therapy Speech language pathology    Further, I certify that my clinical findings support that this patient is homebound due to:  Unable to leave home safely without assistance            Medication List    TAKE these medications        aspirin 325 MG tablet  Take 1 tablet (325 mg total) by mouth daily.  Start taking on:  06/16/2016     levETIRAcetam 500 MG tablet  Commonly known as:  KEPPRA  Take 1 tablet (500 mg total) by mouth 2 (two) times daily.           Follow-up Information    Follow up with [facility].   Specialty:  Internal Medicine   Why:  has appointment on 06/26/2016 at 1:30 pm. Please bring a picture ID and your current medications.   Contact information:   [address] [place] [phone]      Follow  up with Noha Milberger, C, MD In 2 weeks.   Specialty:  Neurosurgery   Contact information:   [address] [place] [address] [phone]       Signed: Jackelyn HoehnUNDKUMAR, Sherria Riemann, C 06/15/2016, 4:13 PM

## 2016-06-15 NOTE — Progress Notes (Signed)
Patient is discharged from room 5C09 at this time. Alert and in stable condition. IV site d/c'd and instructions read with understanding verbalized. Left unit via wheelchair with family and all belongings at side.

## 2016-06-26 ENCOUNTER — Ambulatory Visit: Payer: Self-pay | Admitting: Family Medicine

## 2016-07-25 ENCOUNTER — Ambulatory Visit: Payer: Self-pay | Admitting: Family Medicine

## 2016-08-27 IMAGING — MR MR MRA HEAD W/O CM
9 of 11 series · 29 of 48 positions shown · non-contrast
Comparison: Prior CT from earlier the same day.

CLINICAL DATA: Initial evaluation for episode of right-sided
weakness, slurred speech.

EXAM:
MRI HEAD WITHOUT CONTRAST
MRA HEAD WITHOUT CONTRAST
TECHNIQUE: Multiplanar, multiecho pulse sequences of the brain and surrounding
structures were obtained without intravenous contrast. Angiographic
images of the head were obtained using MRA technique without
contrast.

[Series 3: FLAIR · sagittal · 5.0mm · 0.47mm/px · 1 of 23 slices shown (1 of 2)]
[im 1/23]
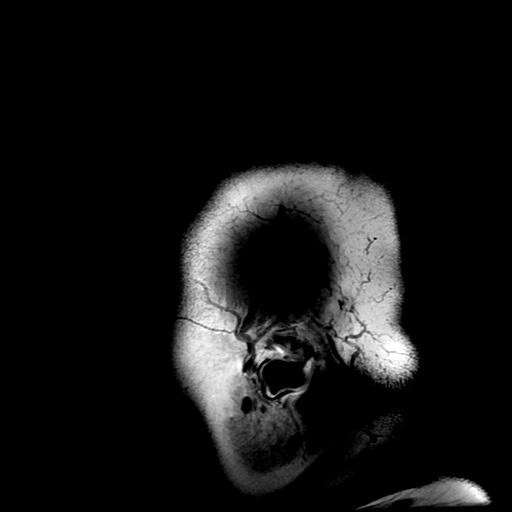

[Series 5: DWI · axial · 3.0mm · 0.94mm/px · z∈[-189,-49]mm · 7 of 98 slices shown (1 of 4)]
[im 1/98]
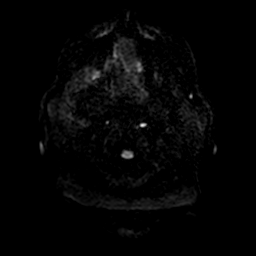
[im 17/98]
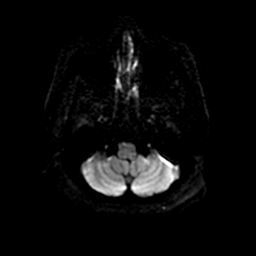
[im 33/98]
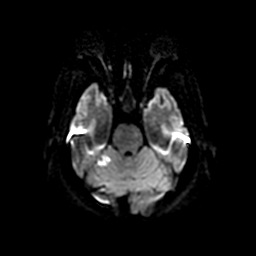
[im 49/98]
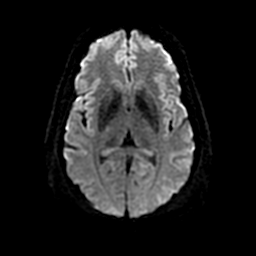
[im 65/98]
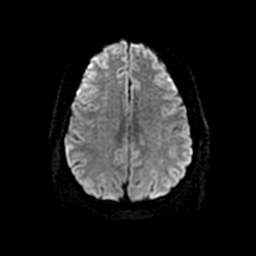
[im 81/98]
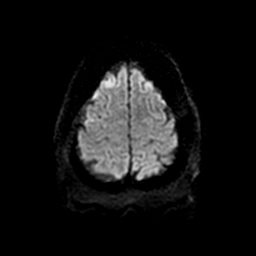
[im 98/98]
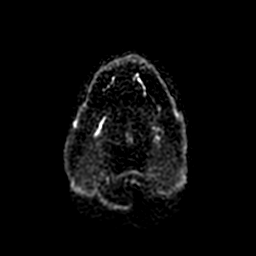

[Series 6: ax (id) 2 · axial · 1.0mm · 0.43mm/px · z∈[-189,-137]mm · 5 of 200 slices shown]
[im 1/200]
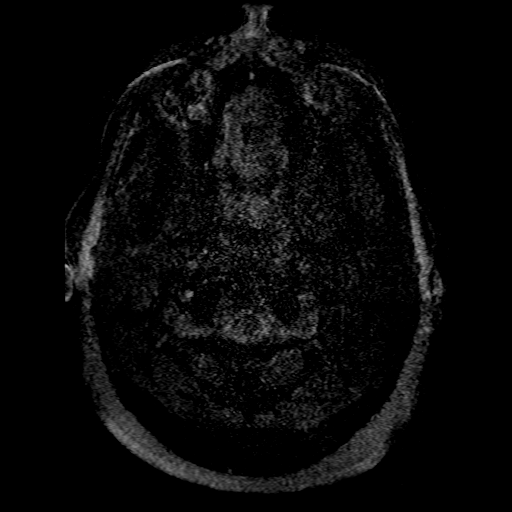
[im 31/200]
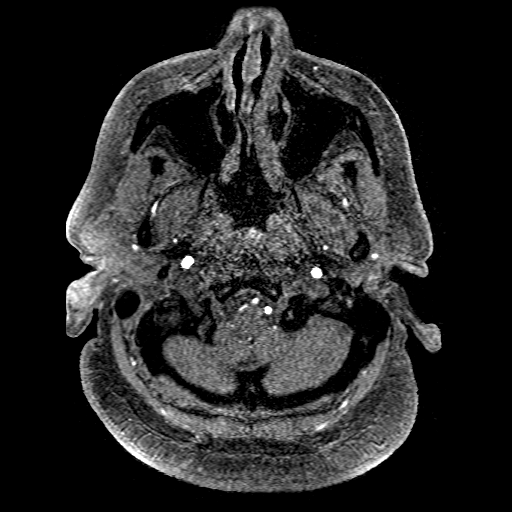
[im 62/200]
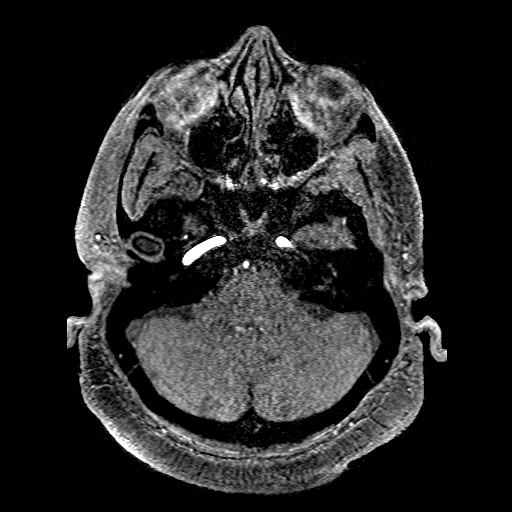
[im 92/200]
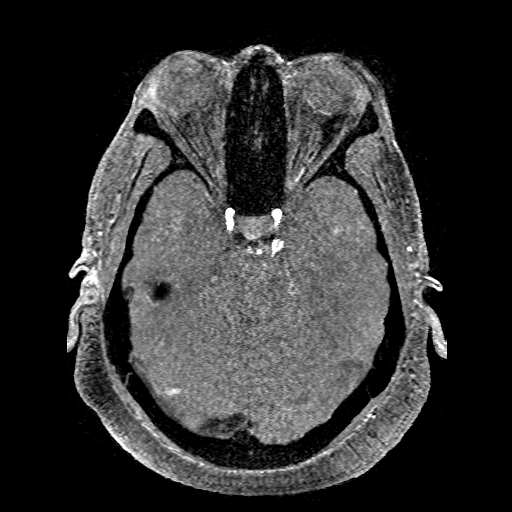
[im 108/200]
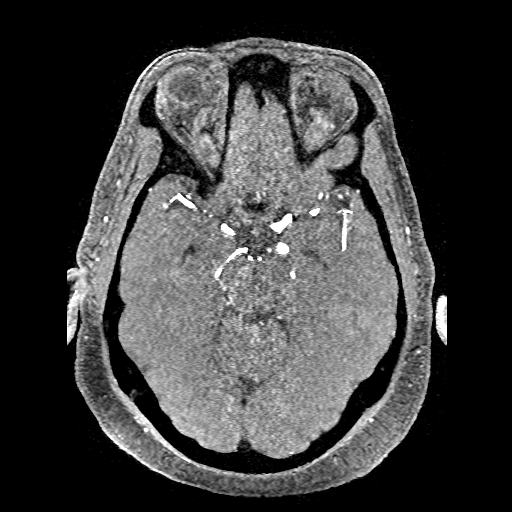

[Series 7: T2 · axial · 5.0mm · 0.43mm/px · z∈[-187,-47]mm · 2 of 25 slices shown (1 of 2)]
[im 1/25]
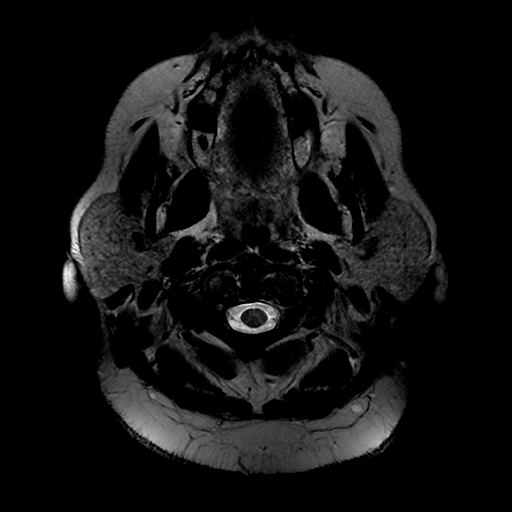
[im 25/25]
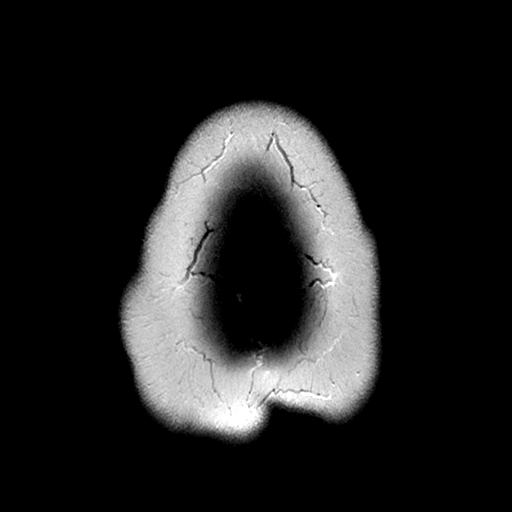

[Series 9: FLAIR · axial · 5.0mm · 0.43mm/px · z∈[-187,-47]mm · 2 of 25 slices shown (2 of 2)]
[im 1/25]
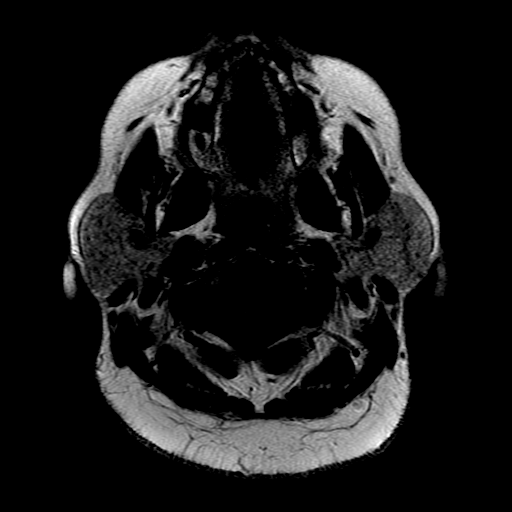
[im 25/25]
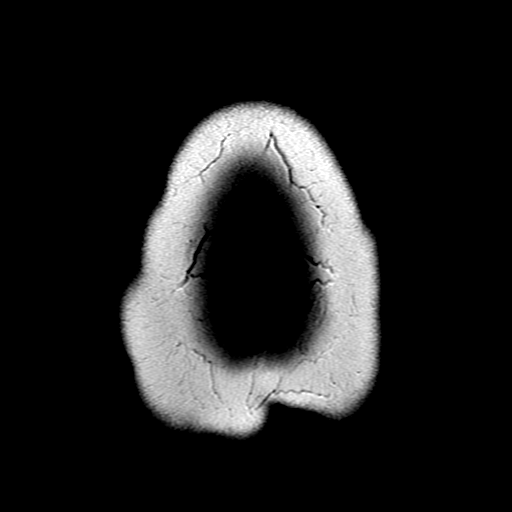

[Series 10: DWI · coronal · 4.0mm · 0.94mm/px · 5 of 72 slices shown (2 of 4)]
[im 1/72]
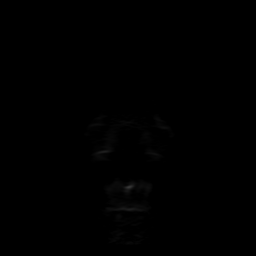
[im 18/72]
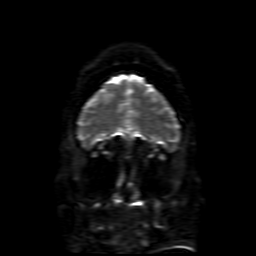
[im 36/72]
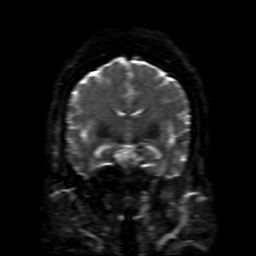
[im 54/72]
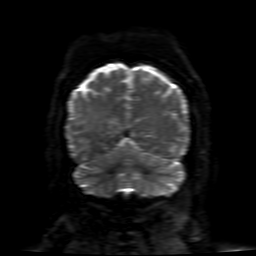
[im 72/72]
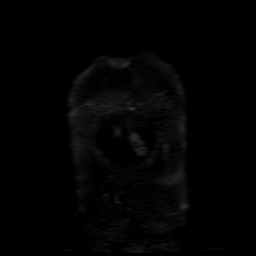

[Series 13: T2 · coronal · 5.0mm · 0.39mm/px · 2 of 30 slices shown (2 of 2)]
[im 1/30]
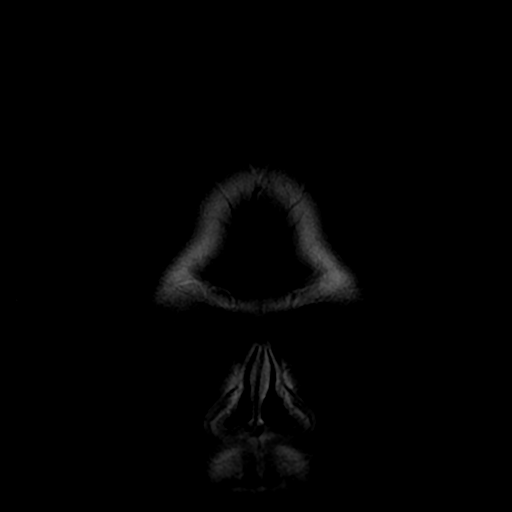
[im 30/30]
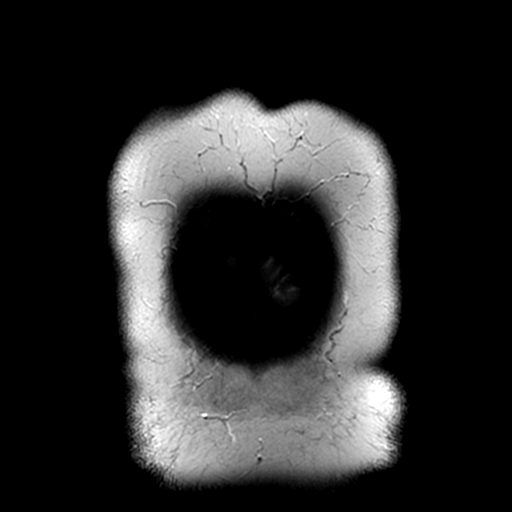

[Series 500: DWI · axial · 3.0mm · 0.94mm/px · z∈[-189,-49]mm · 3 of 49 slices shown (3 of 4)]
[im 1/49]
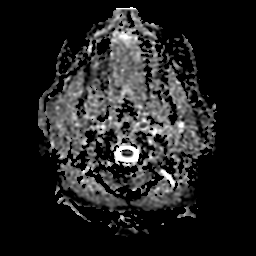
[im 25/49]
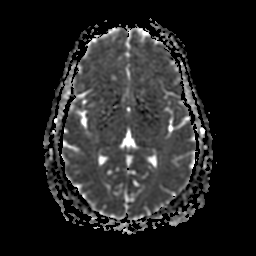
[im 49/49]
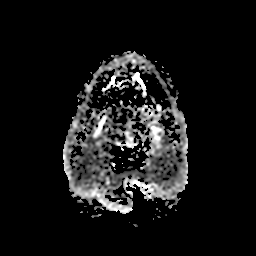

[Series 1000: DWI · coronal · 4.0mm · 0.94mm/px · 2 of 34 slices shown (4 of 4)]
[im 1/34]
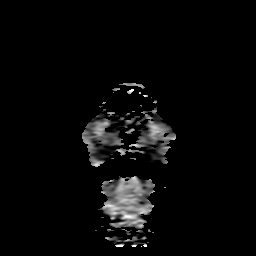
[im 34/34]
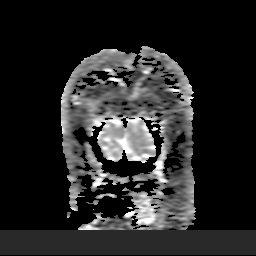

[29 of 48 positions shown; findings below may reference images not displayed]

FINDINGS: MRI HEAD FINDINGS

Cerebral volume normal for patient age. No significant white matter
disease present. Remote lacunar infarct within the left thalamus.

There are patchy acute ischemic nonhemorrhagic infarcts involving
the superior right cerebellar hemisphere within the right superior
cerebellar artery territory. Probable faint associated petechial
hemorrhage on SWI sequence (series 11, image 30) without hemorrhagic
transformation. No significant mass effect. No other infarct.
Gray-white matter deficient in otherwise maintained. Major
intracranial vascular flow voids preserved.

No mass lesion, midline shift, or mass effect. No hydrocephalus. No
extra-axial fluid collection. Major dural sinuses are grossly
patent.

Craniocervical junction normal. Visualized upper cervical spine
unremarkable.

Pituitary gland within normal limits. No acute abnormality about the
orbits.

Paranasal ounces are clear. No mastoid effusion. Inner ear
structures normal.

Bone marrow signal intensity normal. No scalp soft tissue
abnormality.

MRA HEAD FINDINGS

ANTERIOR CIRCULATION:

Visualized distal cervical segments of the internal carotid arteries
are patent with antegrade flow. Petrous, cavernous, and supraclinoid
segments widely patent bilaterally. Ovoid saccular aneurysm
measuring 10 x 6 mm present at the takeoff of the left posterior
communicating artery. A1 segments, anterior communicating artery,
and anterior cerebral arteries well opacified. M1 segments patent
without stenosis or occlusion. MCA bifurcations normal. Distal MCA
branches well opacified and symmetric.

POSTERIOR CIRCULATION:

Vertebral arteries patent to the vertebrobasilar junction para right
vertebral artery is diminutive. Posterior inferior cerebellar
arteries patent. Basilar artery widely patent to its distal aspect.
Superior cerebellar arteries patent proximally. P1 segment somewhat
hypoplastic bilaterally with prominent posterior communicating
arteries. PCAs well opacified to their distal aspects.
IMPRESSION: MRI HEAD IMPRESSION:

1. Acute ischemic right cerebellar infarcts as above. No associated
mass-effect. Associated faint petechial hemorrhage without evidence
for hemorrhagic transformation.
2. Remote lacunar infarct within the left thalamus.

MRA HEAD IMPRESSION:

1. No large or proximal arterial branch occlusion within the
intracranial circulation. No high-grade or correctable stenosis.
2. 10 x 6 mm saccular left PCOM aneurysm. Neurovascular consultation
suggested.

## 2016-09-19 ENCOUNTER — Encounter (HOSPITAL_COMMUNITY): Payer: Self-pay | Admitting: Neurosurgery

## 2017-06-07 ENCOUNTER — Encounter (HOSPITAL_COMMUNITY): Payer: Self-pay | Admitting: Emergency Medicine

## 2017-06-07 ENCOUNTER — Emergency Department (HOSPITAL_COMMUNITY)
Admission: EM | Admit: 2017-06-07 | Discharge: 2017-06-07 | Disposition: A | Discharge status: Home / Self Care | Payer: Self-pay | Attending: Emergency Medicine | Admitting: Emergency Medicine

## 2017-06-07 DIAGNOSIS — Y929 Unspecified place or not applicable: Secondary | ICD-10-CM | POA: Insufficient documentation

## 2017-06-07 DIAGNOSIS — J45909 Unspecified asthma, uncomplicated: Secondary | ICD-10-CM | POA: Insufficient documentation

## 2017-06-07 DIAGNOSIS — W57XXXA Bitten or stung by nonvenomous insect and other nonvenomous arthropods, initial encounter: Secondary | ICD-10-CM | POA: Insufficient documentation

## 2017-06-07 DIAGNOSIS — Y939 Activity, unspecified: Secondary | ICD-10-CM | POA: Insufficient documentation

## 2017-06-07 DIAGNOSIS — F17210 Nicotine dependence, cigarettes, uncomplicated: Secondary | ICD-10-CM | POA: Insufficient documentation

## 2017-06-07 DIAGNOSIS — S50862A Insect bite (nonvenomous) of left forearm, initial encounter: Secondary | ICD-10-CM | POA: Insufficient documentation

## 2017-06-07 DIAGNOSIS — R21 Rash and other nonspecific skin eruption: Secondary | ICD-10-CM | POA: Insufficient documentation

## 2017-06-07 DIAGNOSIS — Z7982 Long term (current) use of aspirin: Secondary | ICD-10-CM | POA: Insufficient documentation

## 2017-06-07 DIAGNOSIS — R600 Localized edema: Secondary | ICD-10-CM | POA: Insufficient documentation

## 2017-06-07 DIAGNOSIS — Y999 Unspecified external cause status: Secondary | ICD-10-CM | POA: Insufficient documentation

## 2017-06-07 MED ORDER — HYDROXYZINE HCL 25 MG PO TABS: 25.0000 mg | ORAL_TABLET | Freq: Four times a day (QID) | ORAL | 0 refills | Status: DC

## 2017-06-07 NOTE — ED Triage Notes (Signed)
Pt comes with complaints of an insect bite on her left arm that she originally thought was a mosquito but it is not going away.  Endorses a rash on her right arm.  Also states she has bilateral leg edema for the past 6 months. Denies having a PCP.  Able to ambulate independently. A&O x4.

## 2017-06-07 NOTE — ED Provider Notes (Signed)
[facility]-EMERGENCY DEPT Provider Note   CSN: [id number] Arrival date & time: 06/07/17  1506     History   Chief Complaint Chief Complaint  Patient presents with  . Insect Bite  . Leg Swelling  . Rash    HPI Erika Wong is a 51 y.o. female.  HPI Patient presents with 3 concerns. Primary concern is new rash on the right posterior shoulder.  Onset is possibly 3 or 4 days ago, the patient is unclear of the exact time per Since onset the patient has had mild itchiness about the area, but no pain, no bleeding, no discharge, no drainage. She notes that [place] area, but denies obvious precipitant, including gardening, hiking. She and her husband notes that the rash looks better today than it was yesterday.  Secondary concern is insect bite/rash. Onset is similar to the first concern, and also since onset it has gotten generally better. However, there is a focal lesion with mild itchiness about the left anterior forearm. No distal loss of sensation or weakness, no difficulty moving her hand, wrist, fingers.  Tertiary concern is lower extremity edema. She notes over the past 6 months without clear precipitant she has had persistent swelling in both legs, also improved today. No new dyspnea, lightheadedness, chest pain. No recent steroid use, medication changes, dietary changes. She states that she is generally well, though she notes a history of elective aneurysm clipping about one year ago.  Past Medical History:  Diagnosis Date  . Asthma   . Headache    hx  . Stroke (HCC)   . Tobacco abuse     Patient Active Problem List   Diagnosis Date Noted  . Benign essential HTN   . Bradycardia   . Tachypnea   . 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 Right sided cerebral infarction (HCC) 04/23/2016  . Transient cerebral ischemia   . History of CVA (cerebrovascular accident) 03/27/2016  . Prediabetes 03/27/2016  . Acute CVA (cerebrovascular accident) (HCC)  03/10/2016  . Aneurysm, cerebral, nonruptured 03/10/2016  . Obesity 03/10/2016  . Asthma 03/09/2016  . UTI (lower urinary tract infection) 03/09/2016  . Tobacco abuse     Past Surgical History:  Procedure Laterality Date  . CRANIOTOMY Left 06/09/2016   Procedure: Craniotomy - left pterional for clipping of aneurysm;  Surgeon: Lisbeth Renshaw, MD;  Location: [facility];  Service: Neurosurgery;  Laterality: Left;  . IR GENERIC HISTORICAL  04/20/2016   IR ANGIO INTRA EXTRACRAN SEL INTERNAL CAROTID BILAT MOD SED 04/20/2016 Lisbeth Renshaw, MD [facility]INTER[facility]  . IR GENERIC HISTORICAL  04/20/2016   IR ANGIO VERTEBRAL SEL VERTEBRAL BILAT MOD SED 04/20/2016 Lisbeth Renshaw, MD [facility]  . IR GENERIC HISTORICAL  04/20/2016   IR 3D INDEPENDENT WKST 04/20/2016 Lisbeth Renshaw, MD [facility]  . TEE WITHOUT CARDIOVERSION N/A 03/29/2016   Procedure: TRANSESOPHAGEAL ECHOCARDIOGRAM (TEE);  Surgeon: [name], MD;  [facility];  Service: Cardiovascular;  Laterality: N/A;  . TUBAL LIGATION  87    OB History    No data available       Home Medications    Prior to Admission medications   Medication Sig Start Date End Date Taking? Authorizing Provider  aspirin 325 MG tablet Take 1 tablet (325 mg total) by mouth daily. 06/16/16  Yes Lisbeth Renshaw, MD  diphenhydrAMINE (BENADRYL) 50 MG capsule Take 50 mg by mouth every 6 (six) hours as needed for itching.   Yes [provider]  hydrOXYzine (ATARAX/VISTARIL) 25 MG tablet Take 1 tablet (25 mg total) by mouth every 6 (six) hours. 06/07/17   Gerhard MunchLockwood, Maddalynn Barnard, MD  levETIRAcetam (KEPPRA) 500 MG tablet Take 1 tablet (500 mg total) by mouth 2 (two) times daily. Patient not taking: Reported on 06/07/2017 06/15/16   Lisbeth RenshawNundkumar, Neelesh, MD    Family History Family History  Problem Relation Age of Onset  . Congestive Heart Failure Mother        Died age 53  . Diabetes Mother   . Asthma Father   . COPD Father   . Asthma Sister    . Asthma Brother   . Diabetes Sister   . Heart attack Paternal Grandmother [phone]    Social History Social History  Substance Use Topics  . Smoking status: Current Every Day Smoker    Packs/day: 0.50    Years: 35.00    Types: Cigarettes  . Smokeless tobacco: Never Used  . Alcohol use 0.0 oz/week     Comment: last 1/17     Allergies   Adhesive [tape]; Codeine; and Hydrocodone   Review of Systems Review of Systems  Constitutional:       Per HPI, otherwise negative  HENT:       Per HPI, otherwise negative  Respiratory:       Per HPI, otherwise negative  Cardiovascular:       Per HPI, otherwise negative  Gastrointestinal: Negative for vomiting.  Endocrine:       Negative aside from HPI  Genitourinary:       Neg aside from HPI   Musculoskeletal:       Per HPI, otherwise negative  Skin: Positive for rash and wound.  Allergic/Immunologic: Negative for immunocompromised state.  Neurological: Negative for syncope.     Physical Exam Updated Vital Signs BP (!) 144/95 (BP Location: Right Arm)   Pulse 85   Temp 98.2 F (36.8 C) (Oral)   Resp 16   Ht 5\' 3"  (1.6 m)   LMP 03/09/2016   SpO2 97%   Physical Exam  Constitutional: She is oriented to person, place, and time. She appears well-developed and well-nourished. No distress.  HENT:  Head: Normocephalic and atraumatic.  Eyes: Conjunctivae and EOM are normal.  Cardiovascular: Normal rate and regular rhythm.   Pulmonary/Chest: Effort normal and breath sounds normal. No stridor. No respiratory distress.  Abdominal: She exhibits no distension.  Musculoskeletal: She exhibits edema.       Right shoulder: Normal.       Left elbow: Normal.       Left wrist: Normal.  Nonpitting symmetric bilateral lower extremity edema, no wheezing, no erythema, no induration, no fluctuance, no pain  Neurological: She is alert and oriented to person, place, and time. No cranial nerve deficit.  Skin: Skin is warm and dry. Rash noted.      Psychiatric: She has a normal mood and affect.  Nursing note and vitals reviewed.    ED Treatments / Results   Procedures Procedures (including critical care time)  Medications Ordered in ED Medications - No data to display   Initial Impression / Assessment and Plan / ED Course  I have reviewed the triage vital signs and the nursing notes.  Pertinent labs & imaging results that were available during my care of the patient were reviewed by me and considered in my medical decision making (see chart for details).  Well-appearing female presents with 3 concerns. No clear etiology for her lower extremity edema, but absent evidence  for congestive heart failure exacerbation, liver failure, kidney failure, clinically, and given her description of the symptoms present for at least 6 months, this is appropriate for further evaluation as an outpatient. Patient's dermatologic concerns for both consistent with inflammatory reaction, with the shoulder lesion being consistent with rash, possible contact dermatitis, and deformed lesion consistent with recent insect bite. No surrounding erythema around either area, no systemic evidence for bacteremia, no indication for antibiotics. Given the patient's itchiness, she was started on appropriate medication. Patient also provided resources for outpatient follow-up.  Final Clinical Impressions(s) / ED Diagnoses   Final diagnoses:  Rash  Lower extremity edema  New Prescriptions Discharge Medication List as of 06/07/2017  4:28 PM    START taking these medications   Details  hydrOXYzine (ATARAX/VISTARIL) 25 MG tablet Take 1 tablet (25 mg total) by mouth every 6 (six) hours., Starting Thu 06/07/2017, Print         Gerhard Munch, MD 06/07/17 [phone]

## 2017-06-07 NOTE — Discharge Instructions (Signed)
As discussed, your evaluation today has been largely reassuring.  But, it is important that you monitor your condition carefully, and do not hesitate to return to the ED if you develop new, or concerning changes in your condition. ? ?Otherwise, please follow-up with your physician for appropriate ongoing care. ? ?

## 2017-06-18 ENCOUNTER — Other Ambulatory Visit: Payer: Self-pay | Admitting: Family Medicine

## 2017-06-18 ENCOUNTER — Encounter: Payer: Self-pay | Admitting: Family Medicine

## 2017-06-18 ENCOUNTER — Ambulatory Visit (INDEPENDENT_AMBULATORY_CARE_PROVIDER_SITE_OTHER): Payer: Self-pay | Admitting: Family Medicine

## 2017-06-18 VITALS — BP 126/68 | HR 86 | Temp 98.5°F | Resp 16 | Ht 63.0 in | Wt 271.0 lb

## 2017-06-18 DIAGNOSIS — R7303 Prediabetes: Secondary | ICD-10-CM

## 2017-06-18 DIAGNOSIS — T63304S Toxic effect of unspecified spider venom, undetermined, sequela: Secondary | ICD-10-CM

## 2017-06-18 DIAGNOSIS — R3915 Urgency of urination: Secondary | ICD-10-CM

## 2017-06-18 DIAGNOSIS — R350 Frequency of micturition: Secondary | ICD-10-CM

## 2017-06-18 DIAGNOSIS — R600 Localized edema: Secondary | ICD-10-CM

## 2017-06-18 DIAGNOSIS — N390 Urinary tract infection, site not specified: Secondary | ICD-10-CM

## 2017-06-18 LAB — COMPLETE METABOLIC PANEL WITH GFR
ALBUMIN: 3.5 g/dL — AB (ref 3.6–5.1)
ALK PHOS: 103 U/L (ref 33–130)
ALT: 33 U/L — ABNORMAL HIGH (ref 6–29)
AST: 53 U/L — ABNORMAL HIGH (ref 10–35)
BUN: 13 mg/dL (ref 7–25)
CALCIUM: 9.1 mg/dL (ref 8.6–10.4)
CO2: 21 mmol/L (ref 20–31)
Chloride: 106 mmol/L (ref 98–110)
Creat: 0.88 mg/dL (ref 0.50–1.05)
GFR, EST AFRICAN AMERICAN: 87 mL/min (ref >=60)
GFR, EST NON AFRICAN AMERICAN: 76 mL/min (ref >=60)
Glucose, Bld: 242 mg/dL — ABNORMAL HIGH (ref 65–99)
POTASSIUM: 4.2 mmol/L (ref 3.5–5.3)
Sodium: 139 mmol/L (ref 135–146)
Total Bilirubin: 0.4 mg/dL (ref 0.2–1.2)
Total Protein: 6.2 g/dL (ref 6.1–8.1)

## 2017-06-18 LAB — POCT URINALYSIS DIP (DEVICE)
Bilirubin Urine: NEGATIVE
GLUCOSE, UA: 100 mg/dL — AB
Ketones, ur: NEGATIVE mg/dL
LEUKOCYTES UA: NEGATIVE
NITRITE: POSITIVE — AB
PROTEIN: NEGATIVE mg/dL
UROBILINOGEN UA: 1 mg/dL (ref 0.0–1.0)
pH: 5.5 (ref 5.0–8.0)

## 2017-06-18 LAB — CBC WITH DIFFERENTIAL/PLATELET
Basophils Absolute: 0 cells/uL (ref 0–200)
Basophils Relative: 0 %
EOS PCT: 3 %
Eosinophils Absolute: 192 cells/uL (ref 15–500)
HCT: 45.5 % — ABNORMAL HIGH (ref 35.0–45.0)
HEMOGLOBIN: 14.9 g/dL (ref 11.7–15.5)
LYMPHS ABS: 3328 {cells}/uL (ref 850–3900)
Lymphocytes Relative: 52 %
MCH: 30.8 pg (ref 27.0–33.0)
MCHC: 32.7 g/dL (ref 32.0–36.0)
MCV: 94 fL (ref 80.0–100.0)
MPV: 13.1 fL — AB (ref 7.5–12.5)
Monocytes Absolute: 320 cells/uL (ref 200–950)
Monocytes Relative: 5 %
NEUTROS PCT: 40 %
Neutro Abs: 2560 cells/uL (ref 1500–7800)
Platelets: 185 10*3/uL (ref 140–400)
RBC: 4.84 MIL/uL (ref 3.80–5.10)
RDW: 14.9 % (ref 11.0–15.0)
WBC: 6.4 10*3/uL (ref 3.8–10.8)

## 2017-06-18 LAB — POCT GLYCOSYLATED HEMOGLOBIN (HGB A1C): Hemoglobin A1C: 6

## 2017-06-18 MED ORDER — SULFAMETHOXAZOLE-TRIMETHOPRIM 800-160 MG PO TABS: 1.0000 | ORAL_TABLET | Freq: Two times a day (BID) | ORAL | 0 refills | Status: AC

## 2017-06-18 MED ORDER — DOXYCYCLINE HYCLATE 100 MG PO TABS: 100.0000 mg | ORAL_TABLET | Freq: Two times a day (BID) | ORAL | 0 refills | Status: DC

## 2017-06-18 MED FILL — SULFAMETHOXAZOLE/TMP DS TAB: 800-160 | 10 days supply | Qty: 20 | Fill #0

## 2017-06-18 NOTE — Patient Instructions (Addendum)
Urinary tract infection:  Start Bactrim 800-160 mg every 12 hours for 10 days. Increase water intake, wipe from front to back.   Bilateral lower extremity swelling:  Will follow up by phone with any abnormal results Will review EKG Recommend a low salt, lowfat diet divided over 5-6 small meals.   Prediabetes:  Hemoglobin a1C is 6.0, Recommend a lowfat, low carbohydrate diet divided over 5-6 small meals, increase water intake to 6-8 glasses, and 150 minutes per week of cardiovascular exercise.   Spider bite:  Reminded patient to clean area with soap and water.  Elevate to prevent swelling Apply ice to bite 20 minutes 4 times per day as needed; uses warm compresses interchangeably.    Prediabetes Eating Plan Prediabetes-also called impaired glucose tolerance or impaired fasting glucose-is a condition that causes blood sugar (blood glucose) levels to be higher than normal. Following a healthy diet can help to keep prediabetes under control. It can also help to lower the risk of type 2 diabetes and heart disease, which are increased in people who have prediabetes. Along with regular exercise, a healthy diet:  Promotes weight loss.  Helps to control blood sugar levels.  Helps to improve the way that the body uses insulin.  What do I need to know about this eating plan?  Use the glycemic index (GI) to plan your meals. The index tells you how quickly a food will raise your blood sugar. Choose low-GI foods. These foods take a longer time to raise blood sugar.  Pay close attention to the amount of carbohydrates in the food that you eat. Carbohydrates increase blood sugar levels.  Keep track of how many calories you take in. Eating the right amount of calories will help you to achieve a healthy weight. Losing about 7 percent of your starting weight can help to prevent type 2 diabetes.  You may want to follow a Mediterranean diet. This diet includes a lot of vegetables, lean meats or fish,  whole grains, fruits, and healthy oils and fats. What foods can I eat? Grains Whole grains, such as whole-wheat or whole-grain breads, crackers, cereals, and pasta. Unsweetened oatmeal. Bulgur. Barley. Quinoa. Brown rice. Corn or whole-wheat flour tortillas or taco shells. Vegetables Lettuce. Spinach. Peas. Beets. Cauliflower. Cabbage. Broccoli. Carrots. Tomatoes. Squash. Eggplant. Herbs. Peppers. Onions. Cucumbers. Brussels sprouts. Fruits Berries. Bananas. Apples. Oranges. Grapes. Papaya. Mango. Pomegranate. Kiwi. Grapefruit. Cherries. Meats and Other Protein Sources Seafood. Lean meats, such as chicken and [place] or lean cuts of pork and beef. Tofu. Eggs. Nuts. Beans. Dairy Low-fat or fat-free dairy products, such as yogurt, cottage cheese, and cheese. Beverages Water. Tea. Coffee. Sugar-free or diet soda. Seltzer water. Milk. Milk alternatives, such as soy or almond milk. Condiments Mustard. Relish. Low-fat, low-sugar ketchup. Low-fat, low-sugar barbecue sauce. Low-fat or fat-free mayonnaise. Sweets and Desserts Sugar-free or low-fat pudding. Sugar-free or low-fat ice cream and other frozen treats. Fats and Oils Avocado. Walnuts. Olive oil. The items listed above may not be a complete list of recommended foods or beverages. Contact your dietitian for more options. What foods are not recommended? Grains Refined white flour and flour products, such as bread, pasta, snack foods, and cereals. Beverages Sweetened drinks, such as sweet iced tea and soda. Sweets and Desserts Baked goods, such as cake, cupcakes, pastries, cookies, and cheesecake. The items listed above may not be a complete list of foods and beverages to avoid. Contact your dietitian for more information. This information is not intended to replace advice given to you  by your health care provider. Make sure you discuss any questions you have with your health care provider. Document Released: 04/20/2015 Document Revised:  05/11/2016 Document Reviewed: 12/30/2014 Elsevier Interactive Patient Education  2017 [employer].

## 2017-06-18 NOTE — Progress Notes (Signed)
Subjective:    Patient ID: Erika Wong, female    DOB: Apr 05, 1966, 51 y.o.   MRN: [id number]  HPI  [name], a 51 year old female with a history of a CVA and aneurysm presents accompanied by husband complaining of lower extremity swelling. 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 She underwent a surgical clipping on 06/09/2016.     [name] says that she has been elevating extremities to heart level while at rest with out relief. She has been sleeping on 2 pillows and has periodic shortness of breath with ambulation. She has a history of hypertension and has not been on antihypertensive medication in greater than 1 year. She endorses fatigue and weight gain. She denies chest pains, heart palpitations, syncope, dizziness, or weakness.   [name] is complaining of urinary frequency and polyuria. She has been getting up frequently throughout the night. She denies dysuria, vaginal discharge, constipation or incontinence of urine or stool.   Review of Systems  HENT: Negative.   Cardiovascular: Negative.   Gastrointestinal: Negative.   Endocrine: Positive for polydipsia, polyphagia and polyuria.  Musculoskeletal: Negative.   Skin: Negative.   Neurological: Positive for weakness and numbness.  Hematological: Negative.   Psychiatric/Behavioral: Negative.        Objective:   Physical Exam  Constitutional: She is oriented to person, place, and time.  HENT:  Head: Normocephalic and atraumatic.  Right Ear: External ear normal.  Left Ear: External ear normal.  Nose: Nose normal.  Mouth/Throat: Oropharynx is clear and moist.  Eyes: Conjunctivae and EOM are normal. Pupils are equal, round, and reactive to light.  Neck: Normal range of motion. Neck supple.  Cardiovascular:  Pulses:      Carotid pulses are 2+ on the right side, and 2+ on the left  side.      Radial pulses are 2+ on the right side, and 2+ on the left side.       Femoral pulses are 2+ on the right side, and 2+ on the left side.      Popliteal pulses are 2+ on the right side, and 2+ on the left side.       Dorsalis pedis pulses are 2+ on the right side, and 2+ on the left side.       Posterior tibial pulses are 2+ on the right side, and 2+ on the left side.  2 + pitting edema to lower extremities   Abdominal: Soft. Bowel sounds are normal.  Abdomen pendolous  Musculoskeletal: Normal range of motion.  Neurological: She is alert and oriented to person, place, and time. She has normal reflexes.  Skin: Skin is warm and dry.     1 cm round, flat ulcer, hyperpigmented center, surrounded by erythematous blisters, non tender to palpation  Psychiatric: She has a normal mood and affect. Her behavior is normal. Judgment and thought content normal.      BP 126/68 (BP Location: Right Arm, Patient Position: Sitting, Cuff Size: Large)   Pulse 86   Temp 98.5 F (36.9 C) (Oral)   Resp 16   Ht 5\' 3"  (1.6 m)   Wt 271 lb (122.9 kg)   LMP 03/09/2016   SpO2 96%   BMI 48.01 kg/m  Assessment & Plan:  1. Bilateral lower extremity edema Elevate lower extremities to heart level while at rest.  Recommend a  low sodium diet.  - COMPLETE METABOLIC PANEL WITH GFR - CBC with Differential - ANA - EKG 12-Lead - Compression stockings  2. Urinary tract infection without hematuria, site unspecified Positive nitrites on urinalysis.  Will treat UTI empirically with broad spectrum antibiotic.  Will obtain a urine culture - sulfamethoxazole-trimethoprim (BACTRIM DS,SEPTRA DS) 800-160 MG tablet; Take 1 tablet by mouth 2 (two) times daily.  Dispense: 20 tablet; Refill: 0 - Urine Culture  3. Urinary frequency - POCT urinalysis dip (device) - Urine Culture  4. Urinary urgency - POCT urinalysis dip (device) - Urine Culture  5. Prediabetes Hemoglobin a1C is 6.0. Discussed diet  modifications at length. Gave written information on prediabetic diet. Recommend a lowfat, low carbohydrate diet divided over 5-6 small meals, increase water intake to 6-8 glasses, and increase activity level.    - HgB A1c  6. Spider bite wound, undetermined intent, sequela Reminded patient to clean area with soap and water.  Elevate to prevent swelling Apply ice to bite 20 minutes 4 times per day as needed; uses warm compresses interchangeably.    RTC: Will follow up by phone with any abnormal laboratory results. Will follow up in 1 month for bilateral lower extremity edema.    Nolon NationsLaChina Moore [name]  MSN, FNP-C [facility] [address]  , [place] [address] [phone]

## 2017-06-19 LAB — ANTI-NUCLEAR AB-TITER (ANA TITER): ANA Titer 1: 1:40 {titer} — ABNORMAL HIGH

## 2017-06-19 LAB — ANA: Anti Nuclear Antibody(ANA): POSITIVE — AB

## 2017-06-20 ENCOUNTER — Telehealth: Payer: Self-pay | Admitting: Family Medicine

## 2017-06-20 DIAGNOSIS — R768 Other specified abnormal immunological findings in serum: Secondary | ICD-10-CM

## 2017-06-20 DIAGNOSIS — R748 Abnormal levels of other serum enzymes: Secondary | ICD-10-CM

## 2017-06-20 NOTE — Telephone Encounter (Signed)
[name] presented in clinic on 06/18/2017 complaining of periodic fatigue, joint pain, swelling to extremities. On laboratory assessment, [name] was found to have a positive ANA and elevated liver enzymes. I added on a comprehensive systemic lupus panel and hepatitis panel to rule out pathology. Will notify patient with care plan after reviewing laboratory results.   Erika NationsLaChina Moore Hollis  MSN, FNP-C [facility] [address]  [place], [place] [address] [phone]

## 2017-06-21 LAB — URINE CULTURE

## 2017-06-22 ENCOUNTER — Telehealth: Payer: Self-pay

## 2017-06-22 LAB — LUPUS(12) PANEL
ANA: NEGATIVE
C3 Complement: 201 mg/dL — ABNORMAL HIGH (ref 83–193)
C4 COMPLEMENT: 45 mg/dL (ref 15–57)
ENA SM Ab Ser-aCnc: <1
RIBOSOMAL P PROTEIN AB: NEGATIVE
SM/RNP: <1
SSA (Ro) (ENA) Antibody, IgG: <1
SSB (LA) (ENA) ANTIBODY, IGG: NEGATIVE
Scleroderma (Scl-70) (ENA) Antibody, IgG: <1

## 2017-06-22 NOTE — Telephone Encounter (Signed)
I have called and spoke with patient. Advised that E.coli was found growing in urine sample. I asked patient to make sure she was taking her antibiotic as prescribed and to finish all doses, asked to increase water intake, and make sure to wipe from front to back when using the rest room. Patient verbalized understanding and had no questions at this time. Thanks!

## 2017-06-22 NOTE — Telephone Encounter (Signed)
-----   Message from Massie MaroonLachina M Hollis, [place] sent at 06/22/2017 11:23 AM EDT ----- Please inform patient that E.coli is growing in urine. Please continue antibiotic as previously prescribed. Increase water intake as discussed.   Thanks ----- Message ----- From: Loney HeringBatten, Avyaan Summer E, LPN Sent: 5/6/21307/01/2017  11:31 AM To: Massie MaroonLachina M Hollis, FNP

## 2017-06-25 LAB — LUPUS(12) PANEL
Anti Nuclear Antibody(ANA): NEGATIVE
ENA SM Ab Ser-aCnc: <1
Ribosomal P Protein Ab: <1
SM/RNP: NEGATIVE
SSA (RO) (ENA) ANTIBODY, IGG: NEGATIVE
SSB (LA) (ENA) ANTIBODY, IGG: NEGATIVE
Scleroderma (Scl-70) (ENA) Antibody, IgG: <1
ds DNA Ab: <1 IU/mL

## 2017-07-04 ENCOUNTER — Encounter: Payer: Self-pay | Admitting: Family Medicine

## 2017-07-04 ENCOUNTER — Telehealth: Payer: Self-pay | Admitting: Family Medicine

## 2017-07-04 NOTE — Telephone Encounter (Signed)
Have left several phone messages for patient to discuss laboratory results. Will send patient a letter.    Nolon NationsLaChina Moore Vivianne Carles  MSN, FNP-C [facility] Patient [facility] [address]  [place], [place] [address] [phone]

## 2017-07-10 ENCOUNTER — Telehealth: Payer: Self-pay

## 2017-07-10 NOTE — Telephone Encounter (Signed)
Patient called back today regarding lab results. Please advise. Thanks!

## 2017-07-11 ENCOUNTER — Telehealth: Payer: Self-pay | Admitting: Family Medicine

## 2017-07-11 NOTE — Telephone Encounter (Signed)
Erika CulverLeah Wong was evaluated in office on 06/18/2017. Contacted patient to discuss laboratory results. Will send a referral to [facility] as payer source becomes available.    Nolon NationsLaChina Moore [name]  MSN, FNP-C [facility] [address]  , [place] [address] [phone]

## 2017-07-19 ENCOUNTER — Ambulatory Visit (INDEPENDENT_AMBULATORY_CARE_PROVIDER_SITE_OTHER): Payer: Self-pay | Admitting: Family Medicine

## 2017-07-19 ENCOUNTER — Encounter: Payer: Self-pay | Admitting: Family Medicine

## 2017-07-19 VITALS — BP 133/67 | HR 85 | Temp 97.9°F | Resp 16 | Ht 63.0 in | Wt 270.0 lb

## 2017-07-19 DIAGNOSIS — R600 Localized edema: Secondary | ICD-10-CM

## 2017-07-19 DIAGNOSIS — R748 Abnormal levels of other serum enzymes: Secondary | ICD-10-CM

## 2017-07-19 DIAGNOSIS — R768 Other specified abnormal immunological findings in serum: Secondary | ICD-10-CM

## 2017-07-19 DIAGNOSIS — R7303 Prediabetes: Secondary | ICD-10-CM

## 2017-07-19 DIAGNOSIS — Z720 Tobacco use: Secondary | ICD-10-CM

## 2017-07-19 LAB — POCT URINALYSIS DIP (DEVICE)
BILIRUBIN URINE: NEGATIVE
Glucose, UA: NEGATIVE mg/dL
KETONES UR: NEGATIVE mg/dL
LEUKOCYTES UA: NEGATIVE
NITRITE: NEGATIVE
PH: 6.5 (ref 5.0–8.0)
Protein, ur: NEGATIVE mg/dL
Specific Gravity, Urine: 1.025 (ref 1.005–1.030)
Urobilinogen, UA: 1 mg/dL (ref 0.0–1.0)

## 2017-07-19 MED ORDER — FUROSEMIDE 20 MG PO TABS: 20.0000 mg | ORAL_TABLET | Freq: Every day | ORAL | 3 refills | Status: DC

## 2017-07-19 MED FILL — FUROSEMIDE 20 MG TABLET: 20 | 30 days supply | Qty: 30 | Fill #0

## 2017-07-19 NOTE — Progress Notes (Signed)
+   Subjective:    Patient ID: Erika Wong, female    DOB: 1966-08-17, 51 y.o.   MRN: [id number]  HPI  [name], a 51 year old female with a history of a CVA and aneurysm presents accompanied by husband for a follow up of chronic conditions. 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 She underwent a surgical clipping on 06/09/2016.  Reviewed lupus panel, complement was slightly elevated and ANA positive. Results are inconclusive. A referral was sent to rheumatology for further workup and evaluation.     [name] says that she has been elevating extremities to heart level while at rest with out relief. She has been sleeping on 2 pillows and has periodic shortness of breath with ambulation. She has a history of hypertension and has not been on antihypertensive medication in greater than 1 year. She endorses fatigue and weight gain. She denies chest pains, heart palpitations, syncope, dizziness, or weakness.    Review of Systems  HENT: Negative.   Cardiovascular: Negative.   Gastrointestinal: Negative.   Endocrine: Positive for polydipsia.  Musculoskeletal: Positive for arthralgias and joint swelling.  Skin: Negative.   Neurological: Positive for weakness and numbness.  Hematological: Negative.   Psychiatric/Behavioral: Negative.        Objective:   Physical Exam  Constitutional: She is oriented to person, place, and time.  HENT:  Head: Normocephalic and atraumatic.  Right Ear: External ear normal.  Left Ear: External ear normal.  Nose: Nose normal.  Mouth/Throat: Oropharynx is clear and moist.  Eyes: Pupils are equal, round, and reactive to light. Conjunctivae and EOM are normal.  Neck: Normal range of motion. Neck supple.  Cardiovascular:  Pulses:      Carotid pulses are 2+ on the right side, and 2+ on the left side.      Radial pulses are 2+  on the right side, and 2+ on the left side.       Femoral pulses are 2+ on the right side, and 2+ on the left side.      Popliteal pulses are 2+ on the right side, and 2+ on the left side.       Dorsalis pedis pulses are 2+ on the right side, and 2+ on the left side.       Posterior tibial pulses are 2+ on the right side, and 2+ on the left side.  2 + pitting edema to lower extremities   Abdominal: Soft. Bowel sounds are normal.  Abdomen pendolous  Musculoskeletal: Normal range of motion.  Neurological: She is alert and oriented to person, place, and time. She has normal reflexes.  Skin: Skin is warm and dry.  1 cm round, flat ulcer, hyperpigmented center, surrounded by erythematous blisters, non tender to palpation  Psychiatric: She has a normal mood and affect. Her behavior is normal. Judgment and thought content normal.      BP 133/67 (BP Location: Left Arm, Patient Position: Sitting, Cuff Size: Large)   Pulse 85   Temp 97.9 F (36.6 C) (Oral)   Resp 16   Ht 5\' 3"  (1.6 m)   Wt 270 lb (122.5 kg)   LMP 03/09/2016   SpO2 98%   BMI 47.83 kg/m  Assessment & Plan:  1. Bilateral lower extremity edema Will start a trial of lasix for lower extremity edema. Elevate extremities to heart level while at rest.  Continue to use compression stockings.  - furosemide (LASIX) 20 MG tablet; Take 1 tablet (20 mg total) by mouth daily.  Dispense: 30 tablet; Refill: 3 - POCT urinalysis dip (device)  2. Prediabetes Recommend a lowfat, low carbohydrate diet divided over 5-6 small meals, increase water intake to 6-8 glasses, and increase daily ac.   - POCT urinalysis dip (device)  3. Positive ANA (antinuclear antibody) Sent a referral to rheumatology for further work up and evaluation  4. Elevated liver enzymes Elevated AST and ALT. Will review hepatitis panel. Advised to refrain from products that contain acetaminophen and alcohol use.  - Hepatitis panel, acute  5. Tobacco abuse Smoking  cessation instruction/counseling given:  counseled patient on the dangers of tobacco use, advised patient to stop smoking, and reviewed strategies to maximize success    RTC: I will send to rheumatology for further workup and evaluation. Will follow up in 1 month for bilateral lower extremity edema   Nolon NationsLaChina Moore Evalie Hargraves  MSN, FNP-C [facility] [address]  [place], [place] [address] [phone]

## 2017-07-20 LAB — HEPATITIS PANEL, ACUTE
HCV Ab: NONREACTIVE
HEP B S AG: NONREACTIVE
Hep A IgM: 0
Hep B C IgM: NONREACTIVE

## 2017-08-15 MED FILL — FUROSEMIDE 20 MG TABLET: 20 | 30 days supply | Qty: 30 | Fill #1

## 2017-09-17 MED FILL — FUROSEMIDE 20 MG TABLET: 20 | 30 days supply | Qty: 30 | Fill #2

## 2017-10-18 ENCOUNTER — Other Ambulatory Visit: Payer: Self-pay | Admitting: Family Medicine

## 2017-10-18 DIAGNOSIS — R600 Localized edema: Secondary | ICD-10-CM

## 2017-10-18 MED FILL — ?FUROSEMIDE 20 MG TABLET: 20 | 30 days supply | Qty: 30 | Fill #3

## 2017-10-19 ENCOUNTER — Ambulatory Visit: Payer: Self-pay | Admitting: Family Medicine

## 2017-10-22 ENCOUNTER — Encounter: Payer: Self-pay | Admitting: Family Medicine

## 2017-10-22 ENCOUNTER — Ambulatory Visit (INDEPENDENT_AMBULATORY_CARE_PROVIDER_SITE_OTHER): Payer: Self-pay | Admitting: Family Medicine

## 2017-10-22 VITALS — BP 118/86 | HR 83 | Temp 98.0°F | Resp 16 | Ht 63.0 in | Wt 272.0 lb

## 2017-10-22 DIAGNOSIS — R7303 Prediabetes: Secondary | ICD-10-CM

## 2017-10-22 DIAGNOSIS — R600 Localized edema: Secondary | ICD-10-CM

## 2017-10-22 DIAGNOSIS — F17200 Nicotine dependence, unspecified, uncomplicated: Secondary | ICD-10-CM

## 2017-10-22 LAB — POCT URINALYSIS DIP (DEVICE)
Bilirubin Urine: NEGATIVE
GLUCOSE, UA: NEGATIVE mg/dL
Ketones, ur: NEGATIVE mg/dL
LEUKOCYTES UA: NEGATIVE
NITRITE: NEGATIVE
PROTEIN: NEGATIVE mg/dL
SPECIFIC GRAVITY, URINE: 1.01 (ref 1.005–1.030)
UROBILINOGEN UA: 0.2 mg/dL (ref 0.0–1.0)
pH: 5 (ref 5.0–8.0)

## 2017-10-22 LAB — POCT GLYCOSYLATED HEMOGLOBIN (HGB A1C): HEMOGLOBIN A1C: 5.9

## 2017-10-22 NOTE — Patient Instructions (Signed)
We will continue furosemide at 20 mg daily for bilateral lower extremity swelling.  Patient is to elevate extremities to heart level while at rest.  Your previous hemoglobin A1c was 6.0, which is consistent with prediabetes.  Recommend a low-fat, carbohydrate modified diet divided over 5-6 small meals.  Given a copy of an 1800-calorie antidiabetic diet.  We will not start medications at this juncture.  Recommend smoking cessation.

## 2017-10-22 NOTE — Progress Notes (Signed)
+   Subjective:    Patient ID: Erika Wong, female    DOB: 27-Jun-1966, 51 y.o.   MRN: [id number]  HPI  Erika Wong, a 51 year old female with a history of a CVA and aneurysm presents for follow-up of chronic conditions.  Patient states that she feels well and is without complaint.  She states that she is taking all of her medications consistently.  She has a history of swelling to lower extremities and states that symptoms have improved on furosemide daily.  She has been watching salt intake and following a low-fat diet.  She states that over the past several weeks she has not had water in her home and has had to eat out consistently.  She noticed mild swelling to her lower extremities after eating out for a week.     Erika Wong also has a history of prediabetes.  Patient patient's most recent hemoglobin A1c was 6.0.  It was recommended that she start a low-fat, low-carb diet divided over 5-6 small meals and low impact cardiovascular activity several days per week.  She currently denies polyuria, polydipsia, or polyphagia.  Patient continues to be in every day tobacco user.  She has attempted to quit smoking in the past without prolonged success.  She has been smoking for 30 years.  Past Medical History:  Diagnosis Date  . Asthma   . Headache    hx  . Stroke (HCC)   . Tobacco abuse    Immunization History  Administered Date(s) Administered  . Pneumococcal Polysaccharide-23 03/27/2016, 06/11/2016  . Tdap 03/27/2016   Social History   Socioeconomic History  . Marital status: Married    Spouse name: Not on file  . Number of children: 3  . Years of education: Not on file  . Highest education level: Not on file  Social Needs  . Financial resource strain: Not on file  . Food insecurity - worry: Not on file  . Food insecurity - inability: Not on file  . Transportation needs - medical: Not on file  . Transportation needs - non-medical: Not on file  Occupational History  .  Occupation: Works at [employer]  Tobacco Use  . Smoking status: Current Every Day Smoker    Packs/day: 0.50    Years: 35.00    Pack years: 17.50    Types: Cigarettes  . Smokeless tobacco: Never Used  Substance and Sexual Activity  . Alcohol use: Yes    Alcohol/week: 0.0 oz    Comment: last 1/17  . Drug use: Yes    Types: Marijuana    Comment: last 05/29/16  . Sexual activity: Not on file  Other Topics Concern  . Not on file  Social History Narrative   17 grandchildren.  Lives with father.     Review of Systems  HENT: Negative.   Cardiovascular: Negative.   Gastrointestinal: Negative.   Endocrine: Negative for polydipsia.  Musculoskeletal: Negative for arthralgias and joint swelling.  Skin: Negative.   Neurological: Negative for weakness and numbness.  Hematological: Negative.   Psychiatric/Behavioral: Negative.        Objective:   Physical Exam  Constitutional: She is oriented to person, place, and time.  HENT:  Head: Normocephalic and atraumatic.  Right Ear: External ear normal.  Left Ear: External ear normal.  Nose: Nose normal.  Mouth/Throat: Oropharynx is clear and moist.  Eyes: Conjunctivae and EOM are normal. Pupils are equal, round, and reactive to light.  Neck: Normal range of motion.  Neck supple.  Cardiovascular: Regular rhythm and normal heart sounds.  Abdominal: Soft. Bowel sounds are normal.  Abdomen pendolous  Musculoskeletal: Normal range of motion.  Neurological: She is alert and oriented to person, place, and time. She has normal reflexes.  Skin: Skin is warm and dry.  Psychiatric: She has a normal mood and affect. Her behavior is normal. Judgment and thought content normal.      BP 118/86 (BP Location: Right Arm, Patient Position: Sitting, Cuff Size: Large) Comment: manually  Pulse 83   Temp 98 F (36.7 C) (Oral)   Resp 16   Ht 5\' 3"  (1.6 m)   Wt 272 lb (123.4 kg)   LMP 03/09/2016   SpO2 100%   BMI 48.18 kg/m  Assessment & Plan:   1. Prediabetes Recommend a lowfat, low carbohydrate diet divided over 5-6 small meals, increase water intake to 6-8 glasses, and 150 minutes per week of cardiovascular exercise.   - HgB A1c  2. Bilateral lower extremity edema We will continue furosemide 20 mg daily as prescribed.  Will review potassium level as results become available. - COMPLETE METABOLIC PANEL WITH GFR  3. Tobacco dependence Smoking cessation instruction/counseling given:  counseled patient on the dangers of tobacco use, advised patient to stop smoking, and reviewed strategies to maximize success   4. Morbid obesity  Body mass index is 48.18 kg/m. Given information on a 1800 calorie, low fat, low carbohydrate diet, discussed at length.  Also, start a low impact cardiovascular exercise regimen.    RTC: 6 months for prediabetes   Nolon Nations  MSN, FNP-C [facility] [address] [place], [place] [address] [phone]   Nolon Nations  MSN, FNP-C Patient [facility] [id number] [place], [place] [address] [phone]

## 2017-10-26 ENCOUNTER — Other Ambulatory Visit: Payer: Self-pay

## 2017-10-26 DIAGNOSIS — R7303 Prediabetes: Secondary | ICD-10-CM

## 2017-10-26 LAB — COMPLETE METABOLIC PANEL WITH GFR
AG RATIO: 1.3 (calc) (ref 1.0–2.5)
ALT: 26 U/L (ref 6–29)
AST: 36 U/L — AB (ref 10–35)
Albumin: 3.5 g/dL — ABNORMAL LOW (ref 3.6–5.1)
Alkaline phosphatase (APISO): 91 U/L (ref 33–130)
BILIRUBIN TOTAL: 0.5 mg/dL (ref 0.2–1.2)
BUN: 16 mg/dL (ref 7–25)
CHLORIDE: 105 mmol/L (ref 98–110)
CO2: 27 mmol/L (ref 20–32)
Calcium: 9.2 mg/dL (ref 8.6–10.4)
Creat: 0.94 mg/dL (ref 0.50–1.05)
GFR, EST AFRICAN AMERICAN: 80 mL/min/{1.73_m2} (ref >=60)
GFR, EST NON AFRICAN AMERICAN: 69 mL/min/{1.73_m2} (ref >=60)
GLOBULIN: 2.6 g/dL (ref 1.9–3.7)
Glucose, Bld: 118 mg/dL — ABNORMAL HIGH (ref 65–99)
POTASSIUM: 4.6 mmol/L (ref 3.5–5.3)
SODIUM: 139 mmol/L (ref 135–146)
TOTAL PROTEIN: 6.1 g/dL (ref 6.1–8.1)

## 2017-10-30 ENCOUNTER — Telehealth: Payer: Self-pay

## 2017-10-30 NOTE — Telephone Encounter (Signed)
-----   Message from Massie MaroonLachina M Hollis, [place] sent at 10/29/2017  8:10 AM EST ----- Please inform patient that liver enzymes have improved.  Recommend that patient refrain from alcohol use and or acetaminophen.  Please follow-up in office as previously scheduled Thanks

## 2017-10-30 NOTE — Telephone Encounter (Signed)
Called and spoke with patient. Advised that liver enzymes have improved and that she needs to avoid alcohol and acetaminophen. Patient verbalized understanding. Thanks!

## 2017-12-19 ENCOUNTER — Other Ambulatory Visit: Payer: Self-pay | Admitting: Family Medicine

## 2017-12-19 DIAGNOSIS — R600 Localized edema: Secondary | ICD-10-CM

## 2018-04-22 ENCOUNTER — Ambulatory Visit: Payer: Self-pay | Admitting: Family Medicine

## 2018-04-30 ENCOUNTER — Encounter (HOSPITAL_COMMUNITY): Payer: Self-pay | Admitting: Emergency Medicine

## 2018-04-30 ENCOUNTER — Emergency Department (HOSPITAL_COMMUNITY)
Admission: EM | Admit: 2018-04-30 | Discharge: 2018-04-30 | Disposition: A | Discharge status: Home / Self Care | Payer: Self-pay | Attending: Emergency Medicine | Admitting: Emergency Medicine

## 2018-04-30 DIAGNOSIS — H5712 Ocular pain, left eye: Secondary | ICD-10-CM | POA: Insufficient documentation

## 2018-04-30 DIAGNOSIS — J45909 Unspecified asthma, uncomplicated: Secondary | ICD-10-CM | POA: Insufficient documentation

## 2018-04-30 DIAGNOSIS — F17210 Nicotine dependence, cigarettes, uncomplicated: Secondary | ICD-10-CM | POA: Insufficient documentation

## 2018-04-30 DIAGNOSIS — Z79899 Other long term (current) drug therapy: Secondary | ICD-10-CM | POA: Insufficient documentation

## 2018-04-30 DIAGNOSIS — Z8673 Personal history of transient ischemic attack (TIA), and cerebral infarction without residual deficits: Secondary | ICD-10-CM | POA: Insufficient documentation

## 2018-04-30 DIAGNOSIS — Z7982 Long term (current) use of aspirin: Secondary | ICD-10-CM | POA: Insufficient documentation

## 2018-04-30 MED ORDER — ERYTHROMYCIN 5 MG/GM OP OINT
TOPICAL_OINTMENT | Freq: Once | OPHTHALMIC | Status: AC
  Administered 2018-04-30: 1 via OPHTHALMIC
  Filled 2018-04-30: qty 3.5

## 2018-04-30 MED ORDER — TETRACAINE HCL 0.5 % OP SOLN
1.0000 [drp] | Freq: Once | OPHTHALMIC | Status: AC
  Administered 2018-04-30: 1 [drp] via OPHTHALMIC
  Filled 2018-04-30: qty 4

## 2018-04-30 MED ORDER — FLUORESCEIN SODIUM 1 MG OP STRP
1.0000 | ORAL_STRIP | Freq: Once | OPHTHALMIC | Status: AC
  Administered 2018-04-30: 1 via OPHTHALMIC
  Filled 2018-04-30: qty 1

## 2018-04-30 NOTE — ED Provider Notes (Signed)
Erika Wong Provider Note   CSN: [id number] Arrival date & time: 04/30/18  1333     History   Chief Complaint Chief Complaint  Patient presents with  . Eye Problem  . Eye Drainage    HPI Erika Wong is a 52 y.o. female who presents to the ED with eye irritation and pain. Patient reports she first felt the pain yesterday like something may be in her eye but then today she had pain. Patient reports it is hard to open her eye due to so much pain.   The history is provided by the patient. No language interpreter was used.  Eye Problem   The current episode started yesterday. The problem occurs constantly. The problem has been gradually worsening. There is a problem in the left eye. There was no injury mechanism. The pain is moderate. There is no history of trauma to the eye. There is no known exposure to pink eye. She does not wear contacts. Associated symptoms include decreased vision, discharge and photophobia. She has tried nothing for the symptoms.    Past Medical History:  Diagnosis Date  . Asthma   . Headache    hx  . Stroke (HCC)   . Tobacco abuse     Patient Active Problem List   Diagnosis Date Noted  . Bradycardia   . Tachypnea   . 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 Right sided cerebral infarction (HCC) 04/23/2016  . Transient cerebral ischemia   . History of CVA (cerebrovascular accident) 03/27/2016  . Prediabetes 03/27/2016  . Acute CVA (cerebrovascular accident) (HCC) 03/10/2016  . Aneurysm, cerebral, nonruptured 03/10/2016  . Morbid obesity (HCC) 03/10/2016  . Asthma 03/09/2016  . UTI (lower urinary tract infection) 03/09/2016  . Tobacco abuse     Past Surgical History:  Procedure Laterality Date  . BRAIN SURGERY     june 23rd. at [facility]   . CRANIOTOMY Left 06/09/2016   Procedure: Craniotomy - left pterional for clipping of aneurysm;  Surgeon: Lisbeth Renshaw, MD;  Location: [facility];  Service:  Neurosurgery;  Laterality: Left;  . IR GENERIC HISTORICAL  04/20/2016   IR ANGIO INTRA EXTRACRAN SEL INTERNAL CAROTID BILAT MOD SED 04/20/2016 Lisbeth Renshaw, MD [facility]  . IR GENERIC HISTORICAL  04/20/2016   IR ANGIO VERTEBRAL SEL VERTEBRAL BILAT MOD SED 04/20/2016 Lisbeth Renshaw, MD [facility]  . IR GENERIC HISTORICAL  04/20/2016   IR 3D INDEPENDENT WKST 04/20/2016 Lisbeth Renshaw, MD [facility]  . TEE WITHOUT CARDIOVERSION N/A 03/29/2016   Procedure: TRANSESOPHAGEAL ECHOCARDIOGRAM (TEE);  Surgeon: [name], MD;  Location[facility] END[facility]COPY;  Service: Cardiovascular;  Laterality: N/A;  . TUBAL LIGATION  87     OB History   None      Home Medications    Prior to Admission medications   Medication Sig Start Date End Date Taking? Authorizing Provider  aspirin 325 MG tablet Take 1 tablet (325 mg total) by mouth daily. 06/16/16  Yes Lisbeth Renshaw, MD  diphenhydrAMINE (BENADRYL) 50 MG capsule Take 50 mg by mouth every 6 (six) hours as needed for itching.   Yes [provider]  furosemide (LASIX) 20 MG tablet TAKE 1 TABLET BY MOUTH DAILY. 10/18/17  Yes Massie Maroon, FNP  furosemide (LASIX) 20 MG tablet TAKE 1 TABLET BY MOUTH DAILY. 12/19/17  Yes Massie Maroon, FNP  hydrOXYzine (ATARAX/VISTARIL) 25 MG tablet Take 1 tablet (25 mg total) by mouth every 6 (six) hours. Patient not taking:  Reported on 07/19/2017 06/07/17   Gerhard Munch, MD    Family History Family History  Problem Relation Age of Onset  . Congestive Heart Failure Mother        Died age 4  . Diabetes Mother   . Asthma Father   . COPD Father   . Asthma Sister   . Asthma Brother   . Diabetes Sister   . Heart attack Paternal Grandmother 72    Social History Social History   Tobacco Use  . Smoking status: Current Every Day Smoker    Packs/day: 0.50    Years: 35.00    Pack years: 17.50    Types: Cigarettes  . Smokeless tobacco: Never Used  Substance Use Topics  . Alcohol use: Yes     Alcohol/week: 0.0 oz    Comment: last 1/17  . Drug use: Yes    Types: Marijuana    Comment: last 05/29/16     Allergies   Adhesive [tape]; Codeine; and Hydrocodone   Review of Systems Review of Systems  Eyes: Positive for photophobia, pain, discharge and visual disturbance.  All other systems reviewed and are negative.    Physical Exam Updated Vital Signs BP 137/85   Pulse 66   Temp 98.1 F (36.7 C) (Oral)   Resp 19   LMP 03/09/2016   SpO2 97%   Physical Exam  Constitutional: She appears well-developed and well-nourished. No distress.  HENT:  Head: Normocephalic.  Eyes: EOM are normal. Lids are everted and swept, no foreign bodies found.  Slit lamp exam:      The left eye shows no fluorescein uptake.  Tried to do slit lamp exam but patient unable to tolerate any light, even the dim blue light . Patient would not open her eye for exam.   Neck: Neck supple.  Cardiovascular: Normal rate.  Pulmonary/Chest: Effort normal.  Abdominal: Soft. There is no tenderness.  Musculoskeletal: Normal range of motion.  Neurological: She is alert.  Skin: Skin is warm and dry.  Psychiatric: She has a normal mood and affect.  Nursing note and vitals reviewed.    ED Treatments / Results  Labs (all labs ordered are listed, but only abnormal results are displayed) Labs Reviewed - No data to display Radiology No results found.  Procedures Procedures (including critical care time)  Medications Ordered in ED Medications  tetracaine (PONTOCAINE) 0.5 % ophthalmic solution 1 drop (1 drop Left Eye Given by Other 04/30/18 1607)  fluorescein ophthalmic strip 1 strip (1 strip Left Eye Given by Other 04/30/18 1607)  erythromycin ophthalmic ointment (1 application Left Eye Given 04/30/18 1708)   4:50 pm Consult with Dr. Sherryll Burger and he will see the patient in the office in the morning at 8 o'clock.Erythromycin Opth ointment every 6 hours until follow up.   Initial Impression / Assessment and  Plan / ED Course  I have reviewed the triage vital signs and the nursing notes.   Final Clinical Impressions(s) / ED Diagnoses   Final diagnoses:  Left eye pain    ED Discharge Orders    None       Kerrie Buffalo [place], [place] 04/30/18 2022    Raeford Razor, MD 05/01/18 [phone]

## 2018-04-30 NOTE — Discharge Instructions (Addendum)
We are treating you with antibiotic eye ointment until you can see Dr. Sherryll Burger in the morning at 8 o'clock. Use the ointment every 6 hours.

## 2018-04-30 NOTE — ED Triage Notes (Signed)
Patient here from home with complaints of left eye pain, drainage, and irritation that started yesterday. Increased today.

## 2018-04-30 NOTE — ED Notes (Signed)
Done by Cheral Almas at 1500

## 2018-09-28 ENCOUNTER — Encounter (HOSPITAL_COMMUNITY): Payer: Self-pay | Admitting: Emergency Medicine

## 2018-09-28 ENCOUNTER — Emergency Department (HOSPITAL_COMMUNITY)
Admission: EM | Admit: 2018-09-28 | Discharge: 2018-09-28 | Disposition: A | Discharge status: Home / Self Care | Payer: Self-pay | Attending: Emergency Medicine | Admitting: Emergency Medicine

## 2018-09-28 DIAGNOSIS — Z7982 Long term (current) use of aspirin: Secondary | ICD-10-CM | POA: Insufficient documentation

## 2018-09-28 DIAGNOSIS — R7303 Prediabetes: Secondary | ICD-10-CM | POA: Insufficient documentation

## 2018-09-28 DIAGNOSIS — R51 Headache: Secondary | ICD-10-CM | POA: Insufficient documentation

## 2018-09-28 DIAGNOSIS — Z8673 Personal history of transient ischemic attack (TIA), and cerebral infarction without residual deficits: Secondary | ICD-10-CM | POA: Insufficient documentation

## 2018-09-28 DIAGNOSIS — F17210 Nicotine dependence, cigarettes, uncomplicated: Secondary | ICD-10-CM | POA: Insufficient documentation

## 2018-09-28 DIAGNOSIS — J45909 Unspecified asthma, uncomplicated: Secondary | ICD-10-CM | POA: Insufficient documentation

## 2018-09-28 DIAGNOSIS — H1033 Unspecified acute conjunctivitis, bilateral: Secondary | ICD-10-CM | POA: Insufficient documentation

## 2018-09-28 MED ORDER — POLYMYXIN B-TRIMETHOPRIM 10000-0.1 UNIT/ML-% OP SOLN
1.0000 [drp] | OPHTHALMIC | 0 refills | Status: DC
Start: 2018-09-28 — End: 2022-03-29

## 2018-09-28 NOTE — ED Provider Notes (Signed)
[facility] EMERGENCY DEPARTMENT Provider Note   CSN: [id number] Arrival date & time: 09/28/18  1259     History   Chief Complaint Chief Complaint  Patient presents with  . Eye Drainage    HPI Erika Wong is a 52 y.o. female ending for evaluation of left eye pain.  Patient states when she woke up this morning, she had left eye pain/drainage.  She is also having some mild pain on the right side.  She reports some mild soreness last night.  Patient denies fevers, chills, ear pain, sore throat, cough.  She does have associated nasal congestion.  Patient works with kindergartners in an afterschool program.  She states she has no medical problems, takes medications daily.  Patient is unable to comment about any visual changes, as she is unwilling to open her eyes. Additional history obtained from chart review.  She was in the ER in May for the same symptoms.  When talking to patient, she states it felt the same at that time.  She never followed up with ophthalmology as instructed. States she does not wear glasses or contacts.  Denies trauma or injury to her eyes.  She has not tried anything for her symptoms.   HPI  Past Medical History:  Diagnosis Date  . Asthma   . Headache    hx  . Stroke (HCC)   . Tobacco abuse     Patient Active Problem List   Diagnosis Date Noted  . Bradycardia   . Tachypnea   . 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 Right sided cerebral infarction (HCC) 04/23/2016  . Transient cerebral ischemia   . History of CVA (cerebrovascular accident) 03/27/2016  . Prediabetes 03/27/2016  . Acute CVA (cerebrovascular accident) (HCC) 03/10/2016  . Aneurysm, cerebral, nonruptured 03/10/2016  . Morbid obesity (HCC) 03/10/2016  . Asthma 03/09/2016  . UTI (lower urinary tract infection) 03/09/2016  . Tobacco abuse     Past Surgical History:  Procedure Laterality Date  . BRAIN SURGERY     june 23rd. at [facility]   . CRANIOTOMY Left 06/09/2016    Procedure: Craniotomy - left pterional for clipping of aneurysm;  Surgeon: Lisbeth Renshaw, MD;  Location: [facility]UR[facility];  Service: Neurosurgery;  Laterality: Left;  . IR GENERIC HISTORICAL  04/20/2016   IR ANGIO INTRA EXTRACRAN SEL INTERNAL CAROTID BILAT MOD SED 04/20/2016 Lisbeth Renshaw, MD [facility]INTER[facility]  . IR GENERIC HISTORICAL  04/20/2016   IR ANGIO VERTEBRAL SEL VERTEBRAL BILAT MOD SED 04/20/2016 Lisbeth Renshaw, MD [facility]  . IR GENERIC HISTORICAL  04/20/2016   IR 3D INDEPENDENT WKST 04/20/2016 Lisbeth Renshaw, MD [facility]  . TEE WITHOUT CARDIOVERSION N/A 03/29/2016   Procedure: TRANSESOPHAGEAL ECHOCARDIOGRAM (TEE);  Surgeon: [name], MD;  Location: [facility] ENDOSCOPY;  Service: Cardiovascular;  Laterality: N/A;  . TUBAL LIGATION  87     OB History   None      Home Medications    Prior to Admission medications   Medication Sig Start Date End Date Taking? Authorizing Provider  aspirin 325 MG tablet Take 1 tablet (325 mg total) by mouth daily. 06/16/16   Lisbeth Renshaw, MD  diphenhydrAMINE (BENADRYL) 50 MG capsule Take 50 mg by mouth every 6 (six) hours as needed for itching.    [provider]  furosemide (LASIX) 20 MG tablet TAKE 1 TABLET BY MOUTH DAILY. 10/18/17   Massie Maroon, FNP  furosemide (LASIX) 20 MG tablet TAKE 1 TABLET BY MOUTH DAILY. 12/19/17  Massie Maroon, FNP  hydrOXYzine (ATARAX/VISTARIL) 25 MG tablet Take 1 tablet (25 mg total) by mouth every 6 (six) hours. Patient not taking: Reported on 07/19/2017 06/07/17   Gerhard Munch, MD  trimethoprim-polymyxin b [facility]) ophthalmic solution Place 1 drop into both eyes every 4 (four) hours. 09/28/18   Kanon Colunga, PA-C    Family History Family History  Problem Relation Age of Onset  . Congestive Heart Failure Mother        Died age 27  . Diabetes Mother   . Asthma Father   . COPD Father   . Asthma Sister   . Asthma Brother   . Diabetes Sister   . Heart attack Paternal  Grandmother 39    Social History Social History   Tobacco Use  . Smoking status: Current Every Day Smoker    Packs/day: 0.50    Years: 35.00    Pack years: 17.50    Types: Cigarettes  . Smokeless tobacco: Never Used  Substance Use Topics  . Alcohol use: Yes    Alcohol/week: 0.0 standard drinks    Comment: last 1/17  . Drug use: Yes    Types: Marijuana    Comment: last 05/29/16     Allergies   Adhesive [tape]; Codeine; and Hydrocodone   Review of Systems Review of Systems  Constitutional: Negative for fever.  HENT: Positive for congestion.   Eyes: Positive for pain and discharge.  Respiratory: Negative for cough.   Cardiovascular: Negative for chest pain.  Gastrointestinal: Negative for nausea and vomiting.     Physical Exam Updated Vital Signs BP (!) 146/63 (BP Location: Right Arm)   Pulse 67   Temp 98.2 F (36.8 C) (Oral)   Resp 16   LMP 03/09/2016   SpO2 97%   Physical Exam  Constitutional: She is oriented to person, place, and time. She appears well-developed and well-nourished. No distress.  HENT:  Head: Normocephalic and atraumatic.  Right Ear: Tympanic membrane, external ear and ear canal normal.  Left Ear: Tympanic membrane, external ear and ear canal normal.  Nose: Nose normal.  Mouth/Throat: Uvula is midline, oropharynx is clear and moist and mucous membranes are normal.  Eyes: Pupils are equal, round, and reactive to light. EOM and lids are normal. Right conjunctiva is injected. Left conjunctiva is injected.  Conjunctival injection bilaterally, worse on the left.  EOMI and PERRLA.  Clear drainage from the left eye.  No surrounding periorbital erythema or edema.  Neck: Normal range of motion.  Cardiovascular: Normal rate, regular rhythm and intact distal pulses.  Pulmonary/Chest: Effort normal and breath sounds normal. No respiratory distress. She has no wheezes.  Abdominal: She exhibits no distension.  Musculoskeletal: Normal range of motion.    Neurological: She is alert and oriented to person, place, and time.  Skin: Skin is warm. No rash noted.  Psychiatric: She has a normal mood and affect.  Nursing note and vitals reviewed.    ED Treatments / Results  Labs (all labs ordered are listed, but only abnormal results are displayed) Labs Reviewed - No data to display  EKG None  Radiology No results found.  Procedures Procedures (including critical care time)  Medications Ordered in ED Medications - No data to display   Initial Impression / Assessment and Plan / ED Course  I have reviewed the triage vital signs and the nursing notes.  Pertinent labs & imaging results that were available during my care of the patient were reviewed by me and considered in  my medical decision making (see chart for details).     Patient presenting for evaluation of bilateral eye pain, redness, drainage.  Physical exam shows patient with bilateral conjunctival injection.  Patient unwilling to open her eyes to complete assessment, including visual acuity or any further testing.  Patient states this feels similar to what she had last time, which was treated successfully in the ER and without follow-up.  I discussed with patient that this is normally something I would want to discuss with ophthalmology and have follow-up, patient states she will not go.  As patient is refusing to follow-up with a specialist, I will tx symptomatically for possible bacterial conjunctivitis and give patient strict return precautions.  Discussed with patient that if her symptoms worsen at all, she needs to return to the emergency room or follow-up with the ophthalmologist she is information was given in the paperwork.  At this time, patient is ready for discharge.  Strict return precautions given.  Patient states she understands and agrees plan.   Final Clinical Impressions(s) / ED Diagnoses   Final diagnoses:  Acute conjunctivitis of both eyes, unspecified acute  conjunctivitis type    ED Discharge Orders         Ordered    trimethoprim-polymyxin b (POLYTRIM) ophthalmic solution  Every 4 hours     09/28/18 1416           Denesia Donelan, PA-C 09/28/18 1727    Mancel Bale, MD 09/29/18 2146

## 2018-09-28 NOTE — ED Triage Notes (Signed)
Pt has had white eye drainage and eye lid swelling since this morning, she also has a runny nose.

## 2018-09-28 NOTE — Discharge Instructions (Addendum)
Use antibiotic drops as prescribed.  Use for 5 days. Use Tylenol or ibuprofen as needed for pain.  Apply cool compress over your eyes to help with swelling. I recommend that you follow-up with ophthalmology for further evaluation.  If your symptoms worsen or not improving in the next 2 days, it is important that you follow-up with ophthalmology. Return to the emergency with any new, worsening, or concerning symptoms.

## 2020-01-15 ENCOUNTER — Ambulatory Visit: Payer: Self-pay | Attending: Internal Medicine

## 2020-01-15 DIAGNOSIS — Z20822 Contact with and (suspected) exposure to covid-19: Secondary | ICD-10-CM | POA: Insufficient documentation

## 2020-01-16 LAB — NOVEL CORONAVIRUS, NAA: SARS-CoV-2, NAA: NOT DETECTED

## 2020-01-19 ENCOUNTER — Telehealth: Payer: Self-pay | Admitting: General Practice

## 2020-01-19 NOTE — Telephone Encounter (Signed)
Gave patient negative covid test results Patient understood 

## 2022-03-29 ENCOUNTER — Encounter: Payer: Self-pay | Admitting: Internal Medicine

## 2022-03-29 ENCOUNTER — Ambulatory Visit (INDEPENDENT_AMBULATORY_CARE_PROVIDER_SITE_OTHER): Payer: Self-pay | Admitting: Internal Medicine

## 2022-03-29 ENCOUNTER — Other Ambulatory Visit (HOSPITAL_COMMUNITY): Payer: Self-pay

## 2022-03-29 VITALS — BP 128/83 | HR 104 | Temp 98.3°F | Wt 271.2 lb

## 2022-03-29 DIAGNOSIS — Z8673 Personal history of transient ischemic attack (TIA), and cerebral infarction without residual deficits: Secondary | ICD-10-CM

## 2022-03-29 DIAGNOSIS — R600 Localized edema: Secondary | ICD-10-CM

## 2022-03-29 DIAGNOSIS — R7303 Prediabetes: Secondary | ICD-10-CM

## 2022-03-29 DIAGNOSIS — Z720 Tobacco use: Secondary | ICD-10-CM

## 2022-03-29 MED ORDER — ASPIRIN EC 81 MG PO TBEC
81.0000 mg | DELAYED_RELEASE_TABLET | Freq: Every day | ORAL | 11 refills | Status: DC
  Filled 2022-03-29 – 2022-04-07 (×2): qty 30, 30d supply, fill #0
  Filled 2022-05-02: qty 30, 30d supply, fill #1

## 2022-03-29 MED ORDER — ATORVASTATIN CALCIUM 40 MG PO TABS
40.0000 mg | ORAL_TABLET | Freq: Every day | ORAL | 11 refills | Status: DC
  Filled 2022-03-29: qty 30, 30d supply, fill #0
  Filled 2022-04-27: qty 30, 30d supply, fill #1
  Filled 2022-05-30 – 2022-06-15 (×2): qty 30, 30d supply, fill #2
  Filled 2022-07-19: qty 30, 30d supply, fill #3
  Filled 2022-08-23: qty 30, 30d supply, fill #4
  Filled 2022-10-02 – 2022-10-12 (×2): qty 30, 30d supply, fill #5
  Filled 2022-11-24: qty 30, 30d supply, fill #6
  Filled 2022-12-22: qty 30, 30d supply, fill #7
  Filled 2023-02-01: qty 30, 30d supply, fill #8
  Filled 2023-03-13: qty 30, 30d supply, fill #9

## 2022-03-29 NOTE — Progress Notes (Signed)
? ?  CC: establish care, R LE swelling ? ?HPI: ? ?[name] is a 56 y.o. female with past medical history as detailed below who presents today to establish care and with additional complaint of RLE swelling. Please see problem based charting for detailed assessment and plan. ? ?Past Medical History:  ?Diagnosis Date  ? Acute CVA (cerebrovascular accident) ([place]) 03/10/2016  ? Aneurysm, cerebral, nonruptured 03/10/2016  ? Asthma   ? Bradycardia   ? Headache   ? hx  ? Right sided cerebral infarction ([place]) 04/23/2016  ? Stroke [facility])   ? Tachypnea   ? Tobacco abuse   ? Transient cerebral ischemia   ? UTI (lower urinary tract infection) 03/09/2016  ? ?Review of Systems:   ?Review of Systems  ?Constitutional:  Negative for malaise/fatigue and weight loss.  ?Respiratory:  Positive for cough. Negative for shortness of breath and wheezing.   ?Cardiovascular:  Positive for orthopnea, leg swelling and PND. Negative for chest pain, palpitations and claudication.  ?Genitourinary:  Positive for frequency.  ?Neurological:  Negative for tremors and sensory change.  ? ? ?Physical Exam: ? ?Vitals:  ? 03/29/22 1353  ?BP: 128/83  ?Pulse: (!) 104  ?Temp: 98.3 ?F (36.8 ?C)  ?TempSrc: Oral  ?SpO2: 99%  ?Weight: 271 lb 3.2 oz (123 kg)  ? ?Constitutional:No acute distress. ?Cardio:Regular rate and rhythm. No murmurs, rubs, gallops. Bilateral PT and DP pulses 2+. ?Pulm:Clear to auscultation bilaterally. Normal work of breathing on room air. ?Abdomen:Soft, nontender, nondistended. ?[id number] bilateral LE pitting edema. Bilateral feet are additionally edematous, appearing to barely fit in shoes. No marked difference in size of LE. No tenderness to palpation. ?Skin:Warm and dry. Flaking of skin noted over distal RLE.  ?Neuro:Alert and oriented x3. No focal deficit noted. Gait is normal. ?Psych:Normal mood and affect. ? ?Assessment & Plan:  ? ?See Encounters Tab for problem based charting. ? ?Patient discussed with Dr. Philipp Ovens ? ?

## 2022-03-29 NOTE — Assessment & Plan Note (Addendum)
Patient suffered from CVA and has residual deficits of memory issues, some generalized weakness, dizziness, "head floating" sensation, and word finding difficulty. Her husband thinks that her speech has been different since the stroke as well, telling her that she doesn't talk as much as she used to. Her deficits are stable and she has not noticed any changes. ?Assessment: Stable at this time. It appears that she has been on aspirin 325 mg daily without other risk-modifying agents such as a statin since that time. BP is at goal, 128/83, without anti-hypertensive medication. She is a smoker with prolonged history which does not help modify her risk of future CVA. ?Plan: Start atorvastatin 40 mg daily for secondary prevention. Change aspirin dosage to 81 mg daily. Once the patient is able to obtain insurance coverage, I will check a lipid panel, HbA1c to assess risk factor profile. Patient counseled on smoking cessation. ?

## 2022-03-29 NOTE — Assessment & Plan Note (Deleted)
Patient suffered from CVA and has residual deficits of memory issues, some generalized weakness, dizziness, "head floating" sensation, and word finding difficulty. Her husband thinks that her speech has been different since the stroke as well, telling her that she doesn't talk as much as she used to. Her deficits are stable and she has not noticed any changes. ?Assessment: Stable at this time. It appears that she has been on aspirin 325 mg daily without other risk-modifying agents such as a statin since that time. BP is at goal, 128/83, without anti-hypertensive medication. She is a smoker with prolonged history which does not help modify her risk of future CVA. ?Plan: Start atorvastatin 40 mg daily for secondary prevention. Change aspirin dosage to 81 mg daily. Once the patient is able to obtain insurance coverage, I will check a lipid panel, HbA1c to assess risk factor profile.  ?

## 2022-03-29 NOTE — Assessment & Plan Note (Signed)
No recent HbA1c; in 2018 this was 5.9%. Recent ED visit CMP in November 2022 revealed glucose of 167 however this was collected at 4 PM so I imagine it was non-fasting. Patient reports frequent urination but denies excessive thirst.  ?Assessment: As she is uninsured, she is unable to pay to have lab work done at this time including POC HBA1C. I do feel strongly that she needs one checked as soon as she has insurance coverage. ?Plan: Obtain HbA1c, urine microalbumin/creatinine ratio once financially able.  ?

## 2022-03-29 NOTE — Assessment & Plan Note (Signed)
Patient reports that she smokes about 1 pack per day and has for 41 years. She denies respiratory symptoms such as shortness of breath or wheezing but does have a dry cough at times that is unchanged. No formal diagnosis of COPD has been made. She has difficulty with ambulation secondary to becoming short of breath at times. She has no breathing treatments prescribed. ? ?Of note, she was noted to have erythrocytosis on labs obtained during ED visit in November 2022 with hemoglobin 17.0 and hematocrit of 50.3%. Though she does not routinely have labs checked, from what I can see these values were normal during prior assessments. Smoking may well be driving the erythrocytosis but unfortunately also increases her risk of blood clots. ? ?Assessment: Patient would benefit tremendously from smoking cessation, both of tobacco products and marijuana.  ?Plan: Counseled patient on risk of blood clots being increased with smoking as well as increased risk of further CVA or other major cardiovascular events because of her smoking. She acknowledged that she needs to cut back and work to wean completely from smoking and understands her increased risk of clots and other adverse events. I have counseled her on when to seek evaluation at the ED for possible DVT/PE including leg pain, difference in size between lower extremities, chest pain, shortness of breath. ?

## 2022-03-29 NOTE — Assessment & Plan Note (Addendum)
Patient endorses complaint of R>L LE edema that has been present since surgery for a brain aneurysm in 2017. She says that it has not gotten worse in this time but that the swelling comes and goes. She denies any pain to the LE or trauma. She denies history of abdominal surgeries and has never had imaging of her abdomen done. ? ?She does endorse requiring three pillows to sleep comfortably at night from a respiratory standpoint however has not had increased head elevation needs recently. Her weight does not fluctuate by more than 1-2 pounds per week. ?Assessment: Physical exam shows trace pitting edema to bilateral LE. No pain to palpation to either LE and there is not a notable size difference between the extremities. PT and DP pulses are 2+ bilaterally. Skin is warm and dry to the extremities with some flaking of the skin noted over the R distal LE. She was on Lasix several years ago which at this time she is unable to recall if it was helpful for her edema. I wonder if she has a component of heart failure given extensive cardiovascular history and LE edema. CMP was done during recent ED visit in November 2022 and fortunately at that time hepatic and renal function were intact. With her recent lab finding of erythrocytosis and smoking there is a chance that she could have several small LE clots however my concern for DVT is low at this time. ?Plan:Once patient obtains insurance coverage, consider echocardiogram and LE ultrasound studies. Monitor for now. ?

## 2022-03-29 NOTE — Patient Instructions (Signed)
Mrs. Neu, ? ?It was a pleasure to meet you today. We discussed your right leg swelling as well as other healthcare maintenance items that you are due for at today's visit. ? ?I do not think your leg swelling is something emergent. If you start to have pain in your calf, notice that the right leg is remarkably larger than the left, or begin having difficulty breathing, please contact our clinic or go to the emergency department so that we can make sure there isn't something more serious going on. ? ?I would like to check sever lab tests on you to check your blood counts, kidney function, liver function, cholesterol levels, and diabetes risk. Please contact our clinic once you get insurance or the [facility] Card so that we can get you in to get those labs. I want to make sure we are taking care of your health from every angle and these labs will help with that. You are also due for several studies including a mammogram, pap smear, and colonoscopy.  ? ?Finally, I have started a medication called atorvastatin. Because of your history of stroke, you are already at an increased risk of recurrent stroke or other adverse cardiovascular event. This can be picked up at the [facility] pharmacy and should be less than $10. ? ?Remember: If you have any questions or concerns, please call our clinic at [phone] between 9am-5pm and after hours call [phone] and ask for the internal medicine resident on call. If you feel you are having a medical emergency please call 911. ? ?Champ Mungo, DO ?

## 2022-03-30 ENCOUNTER — Other Ambulatory Visit (HOSPITAL_COMMUNITY): Payer: Self-pay

## 2022-03-30 NOTE — Progress Notes (Signed)
Internal Medicine Clinic Attending ? ?Case discussed with Dr. Dean  At the time of the visit.  We reviewed the resident?s history and exam and pertinent patient test results.  I agree with the assessment, diagnosis, and plan of care documented in the resident?s note.  ?

## 2022-03-30 NOTE — Addendum Note (Signed)
Addended by: Ihor Dow on: 03/30/2022 05:25 PM ? ? Modules accepted: Level of Service ? ?

## 2022-04-07 ENCOUNTER — Other Ambulatory Visit (HOSPITAL_COMMUNITY): Payer: Self-pay

## 2022-04-27 ENCOUNTER — Other Ambulatory Visit (HOSPITAL_COMMUNITY): Payer: Self-pay

## 2022-05-02 ENCOUNTER — Other Ambulatory Visit (HOSPITAL_COMMUNITY): Payer: Self-pay

## 2022-05-30 ENCOUNTER — Other Ambulatory Visit (HOSPITAL_COMMUNITY): Payer: Self-pay

## 2022-06-08 ENCOUNTER — Other Ambulatory Visit (HOSPITAL_COMMUNITY): Payer: Self-pay

## 2022-06-15 ENCOUNTER — Other Ambulatory Visit (HOSPITAL_COMMUNITY): Payer: Self-pay

## 2022-06-16 ENCOUNTER — Other Ambulatory Visit: Payer: Self-pay | Admitting: Internal Medicine

## 2022-06-16 ENCOUNTER — Other Ambulatory Visit (HOSPITAL_COMMUNITY): Payer: Self-pay

## 2022-06-16 MED ORDER — ASPIRIN 81 MG PO TBEC
81.0000 mg | DELAYED_RELEASE_TABLET | Freq: Every day | ORAL | 2 refills | Status: DC
  Filled 2022-06-16: qty 30, 30d supply, fill #0
  Filled 2022-07-19: qty 30, 30d supply, fill #1
  Filled 2022-08-23: qty 30, 30d supply, fill #2
  Filled 2022-10-02 – 2022-10-12 (×2): qty 30, 30d supply, fill #3
  Filled 2022-11-24: qty 30, 30d supply, fill #4
  Filled 2022-12-22: qty 30, 30d supply, fill #5
  Filled 2023-02-01: qty 30, 30d supply, fill #6
  Filled 2023-03-13: qty 30, 30d supply, fill #7
  Filled 2023-04-24: qty 30, 30d supply, fill #8
  Filled 2023-06-04: qty 30, 30d supply, fill #9

## 2022-07-08 ENCOUNTER — Emergency Department (HOSPITAL_COMMUNITY)
Admission: EM | Admit: 2022-07-08 | Discharge: 2022-07-08 | Disposition: A | Discharge status: Home / Self Care | Payer: Self-pay | Attending: Emergency Medicine | Admitting: Emergency Medicine

## 2022-07-08 ENCOUNTER — Encounter (HOSPITAL_COMMUNITY): Payer: Self-pay | Admitting: Emergency Medicine

## 2022-07-08 DIAGNOSIS — L308 Other specified dermatitis: Secondary | ICD-10-CM | POA: Insufficient documentation

## 2022-07-08 DIAGNOSIS — Z7982 Long term (current) use of aspirin: Secondary | ICD-10-CM | POA: Insufficient documentation

## 2022-07-08 DIAGNOSIS — B372 Candidiasis of skin and nail: Secondary | ICD-10-CM

## 2022-07-08 DIAGNOSIS — R350 Frequency of micturition: Secondary | ICD-10-CM | POA: Insufficient documentation

## 2022-07-08 DIAGNOSIS — B379 Candidiasis, unspecified: Secondary | ICD-10-CM | POA: Insufficient documentation

## 2022-07-08 MED ORDER — FLUCONAZOLE 150 MG PO TABS: 150.0000 mg | ORAL_TABLET | Freq: Once | ORAL | 0 refills | Status: AC

## 2022-07-08 NOTE — ED Triage Notes (Signed)
Patient c/o itching and burning rash to vagina with dysuria x 1-2 weeks. She denies vaginal discharge.

## 2022-07-08 NOTE — ED Provider Notes (Signed)
[facility]-EMERGENCY DEPT Provider Note   CSN: [id number] Arrival date & time: 07/08/22  1235     History  Chief Complaint  Patient presents with   Rash    Lolita Faulds is a 56 y.o. female.  HPI 56 year old female presents today complaining of vaginal rash for the past 2 weeks.  She states that it is on the outside of the vagina.  It causes some burning with urination.  She does have some frequency of urination.  She denies any abnormal vaginal discharge.  States she has not had a period for about 10 years has not been sexually active for about 10 years she denies any abnormal vaginal discharge.  Denies any abdominal pain, fever, chills.  No significant past medical history for her      Home Medications Prior to Admission medications   Medication Sig Start Date End Date Taking? Authorizing Provider  fluconazole (DIFLUCAN) 150 MG tablet Take 1 tablet (150 mg total) by mouth once for 1 dose. 07/08/22 07/08/22 Yes Margarita Grizzle, MD  aspirin EC 81 MG tablet Take 1 tablet (81 mg total) by mouth daily. Swallow whole. 06/16/22 06/16/23  Champ Mungo, DO  atorvastatin (LIPITOR) 40 MG tablet Take 1 tablet (40 mg total) by mouth daily. 03/29/22 03/29/23  Champ Mungo, DO  diphenhydrAMINE (BENADRYL) 50 MG capsule Take 50 mg by mouth every 6 (six) hours as needed for itching.    [provider]      Allergies    Adhesive [tape], Codeine, and Hydrocodone    Review of Systems   Review of Systems  Physical Exam Updated Vital Signs BP 140/83   Pulse 78   Temp (!) 97.5 F (36.4 C) (Oral)   Resp 16   LMP 02/01/2016 Comment: 2 daya  SpO2 99%  Physical Exam Vitals and nursing note reviewed. Exam conducted with a chaperone present.  Constitutional:      General: She is not in acute distress.    Appearance: She is well-developed. She is obese.  HENT:     Head: Normocephalic and atraumatic.     Right Ear: External ear normal.     Left Ear: External ear normal.      Nose: Nose normal.  Eyes:     Conjunctiva/sclera: Conjunctivae normal.     Pupils: Pupils are equal, round, and reactive to light.  Pulmonary:     Effort: Pulmonary effort is normal.  Genitourinary:    Exam position: Lithotomy position.     Pubic Area: Rash present.     Comments: Diffuse beefy rash on the majora and minora due to external vaginal area Musculoskeletal:        General: Normal range of motion.     Cervical back: Normal range of motion and neck supple.  Skin:    General: Skin is warm and dry.  Neurological:     Mental Status: She is alert and oriented to person, place, and time.     Motor: No abnormal muscle tone.     Coordination: Coordination normal.  Psychiatric:        Behavior: Behavior normal.        Thought Content: Thought content normal.     ED Results / Procedures / Treatments   Labs (all labs ordered are listed, but only abnormal results are displayed) Labs Reviewed - No data to display  EKG None  Radiology No results found.  Procedures Procedures    Medications Ordered in ED Medications - No data to  display  ED Course/ Medical Decision Making/ A&P                           Medical Decision Making 56 year old female presents today complaining of vaginal rash for the past 2 weeks.  On exam she has a beefy rash with some whitish discharges consistent with candidiasis Patient denies having regular menses for the past decade, doubt pregnancy Patient denies having intercourse for the past 10 years, doubt any STI. Plan fluconazole Patient follows at the internal medicine clinic at [facility].  She is advised regarding need for follow-up and return precautions voiced understanding.  Risk Prescription drug management.           Final Clinical Impression(s) / ED Diagnoses Final diagnoses:  Yeast dermatitis    Rx / DC Orders ED Discharge Orders          Ordered    fluconazole (DIFLUCAN) 150 MG tablet   Once        07/08/22  1309              Margarita Grizzle, MD 07/08/22 1313

## 2022-07-08 NOTE — ED Notes (Signed)
Patient given discharge instructions, all questions answered. Patient in possession of all belongings, directed to the discharge area  

## 2022-07-19 ENCOUNTER — Other Ambulatory Visit (HOSPITAL_COMMUNITY): Payer: Self-pay

## 2022-08-16 ENCOUNTER — Other Ambulatory Visit: Payer: Self-pay

## 2022-08-16 ENCOUNTER — Other Ambulatory Visit (HOSPITAL_COMMUNITY)
Admission: RE | Admit: 2022-08-16 | Discharge: 2022-08-16 | Disposition: A | Discharge status: Home / Self Care | Payer: No Typology Code available for payment source | Source: Ambulatory Visit | Attending: Internal Medicine | Admitting: Internal Medicine

## 2022-08-16 ENCOUNTER — Ambulatory Visit (INDEPENDENT_AMBULATORY_CARE_PROVIDER_SITE_OTHER): Payer: No Typology Code available for payment source

## 2022-08-16 VITALS — BP 137/81 | HR 87 | Temp 98.6°F | Ht 63.0 in | Wt 256.1 lb

## 2022-08-16 DIAGNOSIS — R21 Rash and other nonspecific skin eruption: Secondary | ICD-10-CM | POA: Insufficient documentation

## 2022-08-16 DIAGNOSIS — N898 Other specified noninflammatory disorders of vagina: Secondary | ICD-10-CM | POA: Insufficient documentation

## 2022-08-16 DIAGNOSIS — R7303 Prediabetes: Secondary | ICD-10-CM

## 2022-08-16 DIAGNOSIS — Z124 Encounter for screening for malignant neoplasm of cervix: Secondary | ICD-10-CM | POA: Diagnosis present

## 2022-08-16 DIAGNOSIS — E119 Type 2 diabetes mellitus without complications: Secondary | ICD-10-CM | POA: Insufficient documentation

## 2022-08-16 DIAGNOSIS — N939 Abnormal uterine and vaginal bleeding, unspecified: Secondary | ICD-10-CM

## 2022-08-16 DIAGNOSIS — R350 Frequency of micturition: Secondary | ICD-10-CM | POA: Diagnosis not present

## 2022-08-16 DIAGNOSIS — E1165 Type 2 diabetes mellitus with hyperglycemia: Secondary | ICD-10-CM

## 2022-08-16 LAB — BASIC METABOLIC PANEL WITH GFR
Anion gap: 11 (ref 5–15)
BUN: 5 mg/dL — ABNORMAL LOW (ref 6–20)
CO2: 25 mmol/L (ref 22–32)
Calcium: 9.3 mg/dL (ref 8.9–10.3)
Chloride: 98 mmol/L (ref 98–111)
Creatinine, Ser: 0.88 mg/dL (ref 0.44–1.00)
GFR, Estimated: >60 mL/min
Glucose, Bld: 606 mg/dL (ref 70–99)
Potassium: 4.1 mmol/L (ref 3.5–5.1)
Sodium: 134 mmol/L — ABNORMAL LOW (ref 135–145)

## 2022-08-16 LAB — POCT GLYCOSYLATED HEMOGLOBIN (HGB A1C): Hemoglobin A1C: 13.1 % — AB (ref 4.0–5.6)

## 2022-08-16 LAB — GLUCOSE, CAPILLARY: Glucose-Capillary: >600 mg/dL (ref 70–99)

## 2022-08-16 MED ORDER — METFORMIN HCL ER 500 MG PO TB24: ORAL_TABLET | ORAL | 0 refills | Status: DC

## 2022-08-16 MED ORDER — FLUCONAZOLE 150 MG PO TABS: 150.0000 mg | ORAL_TABLET | ORAL | 0 refills | Status: DC

## 2022-08-16 MED ORDER — NYSTATIN 100000 UNIT/GM EX POWD: 1.0000 | Freq: Three times a day (TID) | CUTANEOUS | 0 refills | Status: DC

## 2022-08-16 NOTE — Progress Notes (Signed)
   CC: vaginal itching   HPI:  [name] is a 55 y.o. with medical history as below who presents with vaginal rash and itching.   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  Constitutional:      General: She is not in acute distress. HENT:     Head: Normocephalic and atraumatic.  Eyes:     Extraocular Movements: Extraocular movements intact.  Genitourinary:    Comments: Well demarcated discolored plaque without significant erythema involving vulva and surrounding inguinal region. No discharge visualized. Skin:    General: Skin is warm and dry.  Neurological:     Mental Status: She is alert.  Psychiatric:        Mood and Affect: Mood normal.        Behavior: Behavior normal.      Assessment & Plan:   See Encounters Tab for problem based charting.  Uncontrolled type 2 diabetes mellitus with hyperglycemia (HCC) New diagnosis but previously suspected given elevated glucoses (unsure whether fasting or not). Unable to get A1c due to insurance at recent office visits (according to chart). A1c in 2018 at 5.8. Random glucose today >600 and repeat A1c 13.1. Discussed diagnosis with patient. She denies polydipsia but endorses polyuria (3 times an hour). Likely contributing to her current vaginal candidiasis.    -Start metformin (500 mg for 7 days, 1000 for 7  days, and 2000 daily thereafter) -Provided glucometer, pens, and test strips as well as 2019. Practiced finger stick and advised to do so each morning. Log provided. Discussed dietary changes. -F/u in 2-4 weeks to review sugars and management. Would benefit from meeting with diabetes educator.   Vulvar rash Patient was initially seen about a month ago in ED for vaginal pruritis and rash. Received 2 days of fluconazole. Symptoms subsided but never fully remitted and began to worsen again about a week ago. She has been using monistat. Denies pain or discharge. Endorses increased urinary frequency and burning with urination. She is not sexually active and is premenopausal. Exam today consistent with continued yeast infection in the setting of new DMII diagnosis with severely elevated glucose.  -Fluconazole 150 mg twice q72hr -nystatin powder -cervicovaginal swab collected -urinalysis obtained -Instructed patient to call clinic if symptoms do not improve  Pap smear for cervical cancer screening Patient due for Pap smear for cervical cancer screening.  -pap smear performed     Patient seen with Dr. Journalist, newspaper

## 2022-08-16 NOTE — Assessment & Plan Note (Signed)
Patient due for Pap smear for cervical cancer screening.  -pap smear performed

## 2022-08-16 NOTE — Assessment & Plan Note (Addendum)
Patient was initially seen about a month ago in ED for vaginal pruritis and rash. Received 2 days of fluconazole. Symptoms subsided but never fully remitted and began to worsen again about a week ago. She has been using monistat. Denies pain or discharge. Endorses increased urinary frequency and burning with urination. She is not sexually active and is premenopausal. Exam today consistent with continued yeast infection in the setting of new DMII diagnosis with severely elevated glucose.  -Fluconazole 150 mg twice q72hr -nystatin powder -cervicovaginal swab collected -urinalysis obtained -Instructed patient to call clinic if symptoms do not improve

## 2022-08-16 NOTE — Assessment & Plan Note (Addendum)
New diagnosis but previously suspected given elevated glucoses (unsure whether fasting or not). Unable to get A1c due to insurance at recent office visits (according to chart). A1c in 2018 at 5.8. Random glucose today >600 and repeat A1c 13.1. Discussed diagnosis with patient. She denies polydipsia but endorses polyuria (3 times an hour). Likely contributing to her current vaginal candidiasis.    -Start metformin (500 mg for 7 days, 1000 for 7 days, and 2000 daily thereafter) -Provided glucometer, pens, and test strips as well as Journalist, newspaper. Practiced finger stick and advised to do so each morning. Log provided. Discussed dietary changes. -F/u in 2-4 weeks to review sugars and management. Would benefit from meeting with diabetes educator.

## 2022-08-16 NOTE — Assessment & Plan Note (Deleted)
Patient has had elevated glucose in the past but unsure if these were fasting. Last a1c 5.8 in 2018. If diabetic, could be contributing to persistent yeast infections.  -Repeat A1c today -Will begin metformin if glucose >200 and/or A1c >/= 6.5.

## 2022-08-17 DIAGNOSIS — N939 Abnormal uterine and vaginal bleeding, unspecified: Secondary | ICD-10-CM

## 2022-08-17 HISTORY — DX: Abnormal uterine and vaginal bleeding, unspecified: N93.9

## 2022-08-17 LAB — CERVICOVAGINAL ANCILLARY ONLY
Bacterial Vaginitis (gardnerella): NEGATIVE
Candida Glabrata: POSITIVE — AB
Candida Vaginitis: POSITIVE — AB
Chlamydia: NEGATIVE
Comment: NEGATIVE
Comment: NEGATIVE
Comment: NEGATIVE
Comment: NEGATIVE
Comment: NEGATIVE
Comment: NORMAL
Neisseria Gonorrhea: NEGATIVE
Trichomonas: NEGATIVE

## 2022-08-17 LAB — URINALYSIS, ROUTINE W REFLEX MICROSCOPIC
Bilirubin, UA: NEGATIVE
Ketones, UA: NEGATIVE
Leukocytes,UA: NEGATIVE
Nitrite, UA: NEGATIVE
Protein,UA: NEGATIVE
Specific Gravity, UA: >=1.03 — AB (ref 1.005–1.030)
Urobilinogen, Ur: 0.2 mg/dL (ref 0.2–1.0)
pH, UA: 5.5 (ref 5.0–7.5)

## 2022-08-17 LAB — MICROSCOPIC EXAMINATION
Bacteria, UA: NONE SEEN
Casts: NONE SEEN /lpf
Epithelial Cells (non renal): NONE SEEN /hpf (ref 0–10)
WBC, UA: NONE SEEN /hpf (ref 0–5)

## 2022-08-17 NOTE — Assessment & Plan Note (Signed)
Patient noted 2 weeks of ongoing heavier menstrual flow which is a change for her as her menstruation typically last 2-3 days. She was unable to quantify as she does not use pads or tampons. She has had periods a few times a year for several years now. She is not sexually active. Pap was performed but further workup may be warranted including uterine US for assessment of endometrial stripe. New diagnosis of uncontrolled diabetes took precedence but patient following up in 2-4 weeks.  -Discuss symptoms in greater detail at follow up and consider further workup including TVUS -F/u Pap results

## 2022-08-22 NOTE — Progress Notes (Signed)
Internal Medicine Clinic Attending  I saw and evaluated the patient.  I personally confirmed the key portions of the history and exam documented by the resident  and I reviewed pertinent patient test results.  The assessment, diagnosis, and plan were formulated together and I agree with the documentation in the resident's note.   External vaginal exam notable for severe intertrignous rash, during pap smear notable to scant bleeding.  Overall we suspected uncontrolled DM leading to recurrent vaginitis.  Our POC A1c and glucose revealed uncontrolled T2DM.  Other than vaginal symptoms she is minimally symptomatic therfore we decided to take a slower approach to glycemic control.  She will start metformin and titrate up, at follow up in 2 weeks we will consider addition of GLP1-ra.  And later (once vaginal symptoms controlled consider SGLT2i.  Interestingly her results have returned for C. Glabrata which is usually azole resistant.  We will reassess at follow up and consider intravaginal boric acid tx.

## 2022-08-23 ENCOUNTER — Other Ambulatory Visit (HOSPITAL_COMMUNITY): Payer: Self-pay

## 2022-08-23 LAB — CYTOLOGY - PAP: Diagnosis: REACTIVE

## 2022-08-31 ENCOUNTER — Ambulatory Visit (INDEPENDENT_AMBULATORY_CARE_PROVIDER_SITE_OTHER): Payer: No Typology Code available for payment source

## 2022-08-31 ENCOUNTER — Other Ambulatory Visit (HOSPITAL_COMMUNITY): Payer: Self-pay

## 2022-08-31 ENCOUNTER — Ambulatory Visit: Payer: No Typology Code available for payment source | Admitting: Dietician

## 2022-08-31 VITALS — BP 127/75 | HR 96 | Temp 98.2°F | Wt 250.2 lb

## 2022-08-31 DIAGNOSIS — R21 Rash and other nonspecific skin eruption: Secondary | ICD-10-CM

## 2022-08-31 DIAGNOSIS — E1165 Type 2 diabetes mellitus with hyperglycemia: Secondary | ICD-10-CM | POA: Diagnosis not present

## 2022-08-31 DIAGNOSIS — N939 Abnormal uterine and vaginal bleeding, unspecified: Secondary | ICD-10-CM

## 2022-08-31 DIAGNOSIS — Z13220 Encounter for screening for lipoid disorders: Secondary | ICD-10-CM | POA: Diagnosis not present

## 2022-08-31 DIAGNOSIS — Z8673 Personal history of transient ischemic attack (TIA), and cerebral infarction without residual deficits: Secondary | ICD-10-CM

## 2022-08-31 MED ORDER — ACCU-CHEK GUIDE VI STRP
ORAL_STRIP | 12 refills | Status: DC
  Filled 2022-08-31: qty 50, 30d supply, fill #0

## 2022-08-31 MED ORDER — SEMAGLUTIDE(0.25 OR 0.5MG/DOS) 2 MG/3ML ~~LOC~~ SOPN
0.2500 mg | PEN_INJECTOR | SUBCUTANEOUS | 0 refills | Status: DC
  Filled 2022-08-31 – 2022-09-12 (×4): qty 3, 28d supply, fill #0

## 2022-08-31 NOTE — Patient Instructions (Addendum)
Erika Wong, it was a pleasure seeing you today!  Today we discussed: Diabetes: Please begin taking metformin 500 mg twice a day. In 7 days, starting taking 2 pills with breakfast, and one pill with dinner. 7 days later, take 2 pills with breakfast and 2 pills with dinner. We will see you back in 4 weeks and you can bring your glucometer with you. We will also start a new medication that you inject once a week. Erika Wong can educate you on how to do this. I will refer for ophthalmology evaluation. Abnormal bleeding: Will refer to gynecology for transvaginal ultrasound and further workup.    I have ordered the following labs today:  Lab Orders         Lipid Profile         Microalbumin / Creatinine Urine Ratio       Tests ordered today:  none  Referrals ordered today:   Referral Orders         Ambulatory referral to Gynecology         Ambulatory referral to Ophthalmology       I have ordered the following medication/changed the following medications:   Stop the following medications: There are no discontinued medications.   Start the following medications: Meds ordered this encounter  Medications   Semaglutide,0.25 or 0.5MG /DOS, 2 MG/3ML SOPN    Sig: Inject 0.25 mg into the skin once a week.    Dispense:  3 mL    Refill:  0   glucose blood (CONTOUR NEXT TEST) test strip    Sig: Use as instructed    Dispense:  100 each    Refill:  12     Follow-up:  4 weeks    Please make sure to arrive 15 minutes prior to your next appointment. If you arrive late, you may be asked to reschedule.   We look forward to seeing you next time. Please call our clinic at [phone] if you have any questions or concerns. The best time to call is Monday-Friday from 9am-4pm, but there is someone available 24/7. If after hours or the weekend, call the main hospital number and ask for the Internal Medicine Resident On-Call. If you need medication refills, please notify your pharmacy one week in  advance and they will send [place] a request.  Thank you for letting us take part in your care. Wishing you the best!  Thank you, Adron Bene, MD

## 2022-08-31 NOTE — Progress Notes (Signed)
Lab Results  Component Value Date   HGBA1C 13.1 (A) 08/16/2022   HGBA1C 5.9 10/22/2017   HGBA1C 6.0 06/18/2017   HGBA1C 5.8 (H) 03/10/2016    Diabetes Self-Management Education  Visit Type: First/Initial  Appt. Start Time: 1455 Appt. End Time: 1515  08/31/2022  Erika Wong, identified by name and date of birth, is a 56 y.o. female with a diagnosis of Diabetes: Type 2.   ASSESSMENT  Asked by providers to teach patient how to use GLP-1 pen today.   Estimated body mass index is 44.32 kg/m as calculated from the following:   Height as of 08/16/22: 5\' 3"  (1.6 m).   Weight as of an earlier encounter on 08/31/22: 250 lb 3.2 oz (113.5 kg). Wt Readings from Last 10 Encounters:  08/31/22 250 lb 3.2 oz (113.5 kg)  08/16/22 256 lb 1.6 oz (116.2 kg)  03/29/22 271 lb 3.2 oz (123 kg)  10/22/17 272 lb (123.4 kg)  07/19/17 270 lb (122.5 kg)  06/18/17 271 lb (122.9 kg)  06/13/16 259 lb 7.7 oz (117.7 kg)  05/31/16 249 lb 1.6 oz (113 kg)  04/21/16 252 lb (114.3 kg)  04/20/16 250 lb (113.4 kg)      Diabetes Self-Management Education - 08/31/22 1600       Visit Information   Visit Type First/Initial      Initial Visit   Diabetes Type Type 2    Date Diagnosed 06/2022    Are you currently following a meal plan? No    Are you taking your medications as prescribed? Yes      Health Coping   How would you rate your overall health? Fair      Psychosocial Assessment   Patient Belief/Attitude about Diabetes Motivated to manage diabetes    What is the hardest part about your diabetes right now, causing you the most concern, or is the most worrisome to you about your diabetes?   Taking/obtaining medications   injections- she doe snot want to do them   Self-care barriers Lack of material resources    Self-management support Family;Doctor's office    Patient Concerns Medication;Monitoring    Special Needs None    Preferred Learning Style Hands on    Learning Readiness Ready    How often  do you need to have someone help you when you read instructions, pamphlets, or other written materials from your doctor or pharmacy? 3 - Sometimes             Individualized Plan for Diabetes Self-Management Training:   Learning Objective:  Patient will have a greater understanding of diabetes self-management. Patient education plan is to attend individual and/or group sessions per assessed needs and concerns.   Plan:   There are no Patient Instructions on file for this visit.  Expected Outcomes:   better blood sugars and decreased weight Education material provided: Diabetes Resources If problems or questions, patient to contact team via:  Phone  Future DSME appointment:  as needed and by phone 07/2022, RD 08/31/2022 4:26 PM.

## 2022-08-31 NOTE — Progress Notes (Signed)
   CC: DMII f/u  HPI:  Erika Wong is a 56 y.o. with past medical history as below who presents for f/u of her newly diagnosed DMII.  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  Physical Exam:  Vitals:   08/31/22 1439  BP: 127/75  Pulse: 96  Temp: 98.2 F (36.8 C)  TempSrc: Oral  SpO2: 100%  Weight: 250 lb 3.2 oz (113.5 kg)   Physical Exam Constitutional:      General: She is not in acute distress. HENT:     Head: Normocephalic and atraumatic.  Eyes:     Extraocular Movements: Extraocular movements intact.  Cardiovascular:     Rate and Rhythm: Normal rate and regular rhythm.     Pulses:          Dorsalis pedis pulses are 1+ on the right side and 3+ on the left side.  Pulmonary:     Effort: Pulmonary effort is normal.     Breath sounds: Normal breath sounds. No wheezing, rhonchi or rales.  Skin:    General: Skin is warm and dry.  Neurological:     Mental Status: She is alert and oriented to person, place, and time.  Psychiatric:        Mood and Affect: Mood normal.      Assessment & Plan:   See Encounters Tab for problem based charting.  Uncontrolled type 2 diabetes mellitus with hyperglycemia [facility]) Patient presents for follow-up of her newly diagnosed diabetes.  Her sugars initially ranged in the 400s but more most recently at 202 as of 3 days ago when she checked in the morning.  She has only been taking metformin 500 mg daily even though the dose ramp indicated she take twice daily after 7 days.  She denies any symptoms of hypoglycemia.  She states that her polyuria has  improved.  Instructed to bring glucometer at next visit.    -Begin metformin 500 mg twice daily today then dose ramp to 2000 mg daily -Start Ozempic 0.25 mg weekly for 4 weeks -Microalbumin/creatinine ratio today -Follow-up in 4 weeks -Referral to ophthalmology -Foot exam completed today  Vulvar rash Symptoms have resolved since completion of fluconazole.  Hopefully her frequent yeast infections will subside as diabetes comes under control.  Abnormal uterine bleeding (AUB) Patient reports she had regular periods through her mid 40s at which point the frequency decreased to only 1 or 2 periods a year.  Her bleeding also decreased substantially during this time.  However, 2 years ago her bleeding began to increase.  She is currently going on 2 months of heavy bleeding requiring constant use of pads.  Denies cramping.  Denies history of fibroids.  Recent Pap smear benign.  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  Last lipid panel in 2017.  -Repeat lipid panel    Patient seen with Dr. Heide Spark

## 2022-08-31 NOTE — Assessment & Plan Note (Signed)
Symptoms have resolved since completion of fluconazole.  Hopefully her frequent yeast infections will subside as diabetes comes under control.

## 2022-08-31 NOTE — Assessment & Plan Note (Signed)
Patient reports she had regular periods through her mid 13s at which point the frequency decreased to only 1 or 2 periods a year.  Her bleeding also decreased substantially during this time.  However, 2 years ago her bleeding began to increase.  She is currently going on 2 months of heavy bleeding requiring constant use of pads.  Denies cramping.  Denies history of fibroids.  Recent Pap smear benign.  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

## 2022-08-31 NOTE — Assessment & Plan Note (Signed)
Patient is currently on Lipitor 40 mg daily for secondary prevention with history of CVA.  Last lipid panel in 2017.  -Repeat lipid panel

## 2022-08-31 NOTE — Assessment & Plan Note (Addendum)
Patient presents for follow-up of her newly diagnosed diabetes.  Her sugars initially ranged in the 400s but more most recently at 202 as of 3 days ago when she checked in the morning.  She has only been taking metformin 500 mg daily even though the dose ramp indicated she take twice daily after 7 days.  She denies any symptoms of hypoglycemia.  She states that her polyuria has improved.  Instructed to bring glucometer at next visit.    -Begin metformin 500 mg twice daily today then dose ramp to 2000 mg daily -Start Ozempic 0.25 mg weekly for 4 weeks -Microalbumin/creatinine ratio today -Follow-up in 4 weeks -Referral to ophthalmology -Foot exam completed today

## 2022-09-01 ENCOUNTER — Telehealth: Payer: Self-pay

## 2022-09-01 ENCOUNTER — Other Ambulatory Visit (HOSPITAL_COMMUNITY): Payer: Self-pay

## 2022-09-01 LAB — LIPID PANEL
Chol/HDL Ratio: 2.6 ratio (ref 0.0–4.4)
Cholesterol, Total: 120 mg/dL (ref 100–199)
HDL: 47 mg/dL (ref >39)
LDL Chol Calc (NIH): 54 mg/dL (ref 0–99)
Triglycerides: 104 mg/dL (ref 0–149)
VLDL Cholesterol Cal: 19 mg/dL (ref 5–40)

## 2022-09-01 LAB — MICROALBUMIN / CREATININE URINE RATIO
Creatinine, Urine: 248.2 mg/dL
Microalb/Creat Ratio: 6 mg/g creat (ref 0–29)
Microalbumin, Urine: 16.1 ug/mL

## 2022-09-01 MED ORDER — CONTOUR NEXT TEST VI STRP
ORAL_STRIP | Freq: Every day | 12 refills | Status: AC
  Filled 2022-09-01: qty 50, 30d supply, fill #0
  Filled 2022-10-10: qty 50, 30d supply, fill #1
  Filled 2022-11-24: qty 50, 30d supply, fill #2

## 2022-09-01 MED ORDER — CONTOUR NEXT ONE DEVI
Freq: Every day | 0 refills | Status: AC
  Filled 2022-09-01: qty 1, 1d supply, fill #0

## 2022-09-01 MED ORDER — MICROLET LANCETS MISC
Freq: Every day | 0 refills | Status: AC
  Filled 2022-09-01: qty 100, 30d supply, fill #0

## 2022-09-01 NOTE — Addendum Note (Signed)
Addended by: Adron Bene on: 09/01/2022 08:33 AM   Modules accepted: Orders

## 2022-09-01 NOTE — Telephone Encounter (Signed)
Prior Authorization for patient (Ozempic) came through on cover my meds, awaiting next set of questions to complete prior authorization

## 2022-09-04 NOTE — Progress Notes (Signed)
Internal Medicine Clinic Attending   I saw and evaluated the patient.  I personally confirmed the key portions of the history and exam documented by Dr. White and I reviewed pertinent patient test results.  The assessment, diagnosis, and plan were formulated together and I agree with the documentation in the resident's note.  

## 2022-09-11 ENCOUNTER — Other Ambulatory Visit (HOSPITAL_COMMUNITY): Payer: Self-pay

## 2022-09-12 ENCOUNTER — Telehealth: Payer: Self-pay

## 2022-09-12 ENCOUNTER — Other Ambulatory Visit (HOSPITAL_COMMUNITY): Payer: Self-pay

## 2022-09-12 NOTE — Telephone Encounter (Signed)
Pa for pt [facility] ) came through on cover my meds was submitted with last  office notes  and labs .Marland Kitchen Awaiting approval or denial ...        DECISION :    Message from Plan  Approved.  This drug has been approved. Approved quantity: 3 units per 365 day(s).    The drug has been approved from 08/29/2022 to 09/12/2023.    Please call the pharmacy to process your prescription claim. Generic or biosimilar substitution may be required when available and preferred on the formulary.       ( [place] )

## 2022-10-02 ENCOUNTER — Other Ambulatory Visit (HOSPITAL_COMMUNITY): Payer: Self-pay

## 2022-10-10 ENCOUNTER — Other Ambulatory Visit (HOSPITAL_COMMUNITY): Payer: Self-pay

## 2022-10-12 ENCOUNTER — Other Ambulatory Visit (HOSPITAL_COMMUNITY): Payer: Self-pay

## 2022-11-13 ENCOUNTER — Encounter: Payer: No Typology Code available for payment source | Admitting: Internal Medicine

## 2022-11-13 NOTE — Progress Notes (Deleted)
T2DM: Patient was diagnosed with type 2 diabetes mellitus over the last everal months and initiated on the following therapies: metformin 500 mg BID with ramp to 2000 mg daily, ozempic 0.25 mg once weekly. She has not been since since initiating these therapies in September 2023. HbA1c at that time was 13.1%.Today, it is *. Since this visit she has* been checking her CBG*. She is also on atorvastatin 40 mg daily. Plan:

## 2022-11-14 ENCOUNTER — Encounter: Payer: Self-pay | Admitting: Internal Medicine

## 2022-11-20 ENCOUNTER — Encounter: Payer: Self-pay | Admitting: Internal Medicine

## 2022-11-20 ENCOUNTER — Encounter: Payer: No Typology Code available for payment source | Admitting: Internal Medicine

## 2022-11-24 ENCOUNTER — Other Ambulatory Visit (HOSPITAL_COMMUNITY): Payer: Self-pay

## 2022-11-30 ENCOUNTER — Ambulatory Visit (INDEPENDENT_AMBULATORY_CARE_PROVIDER_SITE_OTHER): Payer: Self-pay | Admitting: Internal Medicine

## 2022-11-30 ENCOUNTER — Other Ambulatory Visit (HOSPITAL_COMMUNITY): Payer: Self-pay

## 2022-11-30 VITALS — BP 132/68 | HR 93 | Temp 98.6°F | Wt 248.0 lb

## 2022-11-30 DIAGNOSIS — F17200 Nicotine dependence, unspecified, uncomplicated: Secondary | ICD-10-CM

## 2022-11-30 DIAGNOSIS — F17210 Nicotine dependence, cigarettes, uncomplicated: Secondary | ICD-10-CM

## 2022-11-30 DIAGNOSIS — J069 Acute upper respiratory infection, unspecified: Secondary | ICD-10-CM

## 2022-11-30 DIAGNOSIS — E1165 Type 2 diabetes mellitus with hyperglycemia: Secondary | ICD-10-CM

## 2022-11-30 DIAGNOSIS — Z720 Tobacco use: Secondary | ICD-10-CM

## 2022-11-30 DIAGNOSIS — Z7984 Long term (current) use of oral hypoglycemic drugs: Secondary | ICD-10-CM

## 2022-11-30 LAB — POCT GLYCOSYLATED HEMOGLOBIN (HGB A1C): Hemoglobin A1C: 7 % — AB (ref 4.0–5.6)

## 2022-11-30 LAB — GLUCOSE, CAPILLARY: Glucose-Capillary: 152 mg/dL — ABNORMAL HIGH (ref 70–99)

## 2022-11-30 MED ORDER — METFORMIN HCL 500 MG PO TABS: 500.0000 mg | ORAL_TABLET | Freq: Every day | ORAL | 3 refills | Status: DC

## 2022-11-30 MED ORDER — VARENICLINE TARTRATE 1 MG PO TABS: ORAL_TABLET | ORAL | 0 refills | Status: AC

## 2022-11-30 NOTE — Patient Instructions (Addendum)
Thank you, Ms.Morayo Calais for allowing [place] to provide your care today.   Diabetes The lab work for your diabetes improved to 7 today.  Our goal is to keep this less than 7.  Please continue taking metformin nightly.    Cough At this point I agree with like to treat your cough with nasal spray with Flonase.  Can also use Neti pot with sinus irrigation before using nasal spray.  If symptoms persist for more than 2 weeks then please call clinic.  You may need an antibiotic at that point.  Smoking  Presenting a medication called Chantix.  This is a pill that will help decrease your cravings for smoking.  Please also pick up nicotine patch or gum as this will help with your short-term cravings for smoking.  I have ordered the following labs for you:  Lab Orders         Glucose, capillary         POC Hbg A1C      Referrals ordered today:   Referral Orders  No referral(s) requested today     I have ordered the following medication/changed the following medications:   Stop the following medications: Medications Discontinued During This Encounter  Medication Reason   fluconazole (DIFLUCAN) 150 MG tablet    Semaglutide,0.25 or 0.5MG /DOS, 2 MG/3ML SOPN Discontinued by provider   metFORMIN (GLUCOPHAGE-XR) 500 MG 24 hr tablet Change in therapy     Start the following medications: Meds ordered this encounter  Medications   metFORMIN (GLUCOPHAGE) 500 MG tablet    Sig: Take 1 tablet (500 mg total) by mouth daily.    Dispense:  90 tablet    Refill:  3   varenicline (CHANTIX CONTINUING MONTH PAK) 1 MG tablet    Sig: Take 0.5 tablets (0.5 mg total) by mouth daily for 3 days, THEN 0.5 tablets (0.5 mg total) 2 (two) times daily for 3 days, THEN 1 tablet (1 mg total) 2 (two) times daily.    Dispense:  159 tablet    Refill:  0     Follow up: 3 months  We look forward to seeing you next time. Please call our clinic at [phone] if you have any questions or concerns. The best time to call  is Monday-Friday from 9am-4pm, but there is someone available 24/7. If after hours or the weekend, call the main hospital number and ask for the Internal Medicine Resident On-Call. If you need medication refills, please notify your pharmacy one week in advance and they will send [place] a request.   Thank you for trusting me with your care. Wishing you the best!   Rudene Christians, DO [facility]

## 2022-11-30 NOTE — Progress Notes (Signed)
Subjective:  CC: diabetes follow-up, cough/ sinus congestion for 2 weeks  HPI:  [name] is a 56 y.o. female with a past medical history stated below and presents today for diabetes. She was diagnosed in August of this year and met with Butch Penny for guidance on diet. She has also had sinus congestion with cough for the last 2 weeks. Please see problem based assessment and plan for additional details.  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 Swallow whole. 150 tablet 2   atorvastatin (LIPITOR) 40 MG tablet Take 1 tablet (40 mg total) by mouth daily. 30 tablet 11   Blood Glucose Monitoring Suppl (CONTOUR NEXT ONE) DEVI Use as directed once daily. 1 each 0   diphenhydrAMINE (BENADRYL) 50 MG capsule Take 50 mg by mouth every 6 (six) hours as needed for itching.     glucose blood (CONTOUR NEXT TEST) test strip Use to check blood sugar once daily. 100 each 12   Microlet Lancets MISC Use to check blood sugar once daily. 100 each 0   nystatin powder Apply 1 Application topically 3 (three) times daily. 15 g 0   No current facility-administered medications on file prior to visit.    Family History  Problem Relation Age of Onset   Congestive Heart Failure Mother        Died age 95   Diabetes Mother    Asthma Father    COPD Father    Asthma Sister    Diabetes Sister    Hypertension Sister    Asthma Brother    Prostate cancer Brother    Heart attack Paternal Grandmother 14    Social History    Socioeconomic History   Marital status: Married    Spouse name: Not on file   Number of children: 3   Years of education: Not on file   Highest education level: Not on file  Occupational History   Occupation: Works at [employer]  Tobacco Use   Smoking status: Every Day    Packs/day: 1.00    Years: 41.00    Total pack years: 41.00    Types: Cigarettes   Smokeless tobacco: Never  Vaping Use   Vaping Use: Never used  Substance and Sexual Activity   Alcohol use: Yes   Drug use: Yes    Frequency: 7.0 times per week    Types: Marijuana   Sexual activity: Not on file  Other Topics Concern   Not on file  Social History Narrative   17 grandchildren.  Lives with father.    Social Determinants of Health   Financial Resource Strain: Not on file  Food Insecurity: Not on file  Transportation Needs: Not on file  Physical Activity: Not on file  Stress: Not on file  Social Connections: Not on file  Intimate Partner Violence: Not on file    Review of Systems: ROS negative except for what is noted on the  assessment and plan.  Objective:   Vitals:   11/30/22 1506  BP: 132/68  Pulse: 93  Temp: 98.6 F (37 C)  TempSrc: Oral  SpO2: 97%  Weight: 248 lb (112.5 kg)    Physical Exam: Constitutional: well-appearing  HENT: normocephalic atraumatic, mucous membranes moist, throat is non erythematous Cardiovascular: regular rate and rhythm, no m/r/g Pulmonary/Chest: normal work of breathing on room air, lungs clear to auscultation bilaterally Skin: warm and dry  Assessment & Plan:  Upper respiratory infection Patient with 2 weeks of sinus congestion and productive cough. She denies fevers and chills. She is drinking plenty of fluids with apple juice, cran-apple, sodas, and water. She has not tried anything for her symptoms. A/P: Talked with patient about symptomatic treatment with sinus rinse followed by flonase spray. She can also try OTC therapy with mucinex. If she  does not feel better by next week I encouraged her to call back into clinic as she may need an antibiotic.  Uncontrolled type 2 diabetes mellitus with hyperglycemia ([place]) Patient was diagnosed with diabetes in August.  A1c at that time was 13.1.  She was started on metformin and Ozempic.  She did not start taking Ozempic as she is scared of needles.  She was only able to tolerate 1 tablet of metformin nightly and she was having diarrhea during the day otherwise.  She met with Butch Penny and made changes to her diet to include some vegetables.  She has lost about 8 pounds since her appointment in August.  A: A1c improved to 7. Plan: Diabetes is well-controlled with dietary changes and metformin 500 daily. Congratulated patient on her work and losing weight and making dietary changes.  Semaglutide removed from medication list.  Metformin 500 mg daily, refill sent to pharmacy. F/u in 3 months  Tobacco abuse She is interested in smoking cessation.  She smokes half a pack a day for last 41 years.  She is interested in pharmacotherapy to help manage her cravings. Plan: Start Chantix.  I talked with patient about using nicotine patches versus gum in addition to pill to help with cravings. Follow-up in 3 months    Patient discussed with Dr. Ennis Forts Darnell Jeschke, D.O. [facility]  PGY-2 Pager: [phone]  Phone: [phone] Date 12/01/2022  Time 8:39 AM

## 2022-12-01 DIAGNOSIS — J069 Acute upper respiratory infection, unspecified: Secondary | ICD-10-CM | POA: Insufficient documentation

## 2022-12-01 NOTE — Assessment & Plan Note (Addendum)
Patient was diagnosed with diabetes in August.  A1c at that time was 13.1.  She was started on metformin and Ozempic.  She did not start taking Ozempic as she is scared of needles.  She was only able to tolerate 1 tablet of metformin nightly and she was having diarrhea during the day otherwise.  She met with Butch Penny and made changes to her diet to include some vegetables.  She has lost about 8 pounds since her appointment in August.  A: A1c improved to 7. Plan: Diabetes is well-controlled with dietary changes and metformin 500 daily. Congratulated patient on her work and losing weight and making dietary changes.  Semaglutide removed from medication list.  Metformin 500 mg daily, refill sent to pharmacy. F/u in 3 months

## 2022-12-01 NOTE — Assessment & Plan Note (Signed)
Patient with 2 weeks of sinus congestion and productive cough. She denies fevers and chills. She is drinking plenty of fluids with apple juice, cran-apple, sodas, and water. She has not tried anything for her symptoms. A/P: Talked with patient about symptomatic treatment with sinus rinse followed by flonase spray. She can also try OTC therapy with mucinex. If she does not feel better by next week I encouraged her to call back into clinic as she may need an antibiotic.

## 2022-12-01 NOTE — Assessment & Plan Note (Signed)
She is interested in smoking cessation.  She smokes half a pack a day for last 41 years.  She is interested in pharmacotherapy to help manage her cravings. Plan: Start Chantix.  I talked with patient about using nicotine patches versus gum in addition to pill to help with cravings. Follow-up in 3 months

## 2022-12-04 NOTE — Progress Notes (Signed)
Internal Medicine Clinic Attending  Case discussed with Dr. Masters  At the time of the visit.  We reviewed the resident's history and exam and pertinent patient test results.  I agree with the assessment, diagnosis, and plan of care documented in the resident's note.  

## 2022-12-25 ENCOUNTER — Other Ambulatory Visit (HOSPITAL_COMMUNITY): Payer: Self-pay

## 2022-12-28 ENCOUNTER — Other Ambulatory Visit (HOSPITAL_COMMUNITY): Payer: Self-pay

## 2023-03-01 ENCOUNTER — Encounter: Payer: Self-pay | Admitting: Internal Medicine

## 2023-03-06 ENCOUNTER — Encounter: Payer: Self-pay | Admitting: Student

## 2023-03-09 ENCOUNTER — Other Ambulatory Visit: Payer: Self-pay | Admitting: Student

## 2023-03-09 DIAGNOSIS — Z1231 Encounter for screening mammogram for malignant neoplasm of breast: Secondary | ICD-10-CM

## 2023-03-09 NOTE — Progress Notes (Signed)
Placed an order for screening mammogram.  I will inform staff to call patient to schedule.

## 2023-03-13 ENCOUNTER — Other Ambulatory Visit (HOSPITAL_COMMUNITY): Payer: Self-pay

## 2023-03-20 ENCOUNTER — Other Ambulatory Visit: Payer: Self-pay

## 2023-03-20 ENCOUNTER — Ambulatory Visit (INDEPENDENT_AMBULATORY_CARE_PROVIDER_SITE_OTHER): Payer: Self-pay

## 2023-03-20 ENCOUNTER — Other Ambulatory Visit (HOSPITAL_COMMUNITY): Payer: Self-pay

## 2023-03-20 VITALS — BP 125/62 | HR 89 | Temp 98.2°F | Ht 63.0 in | Wt 260.2 lb

## 2023-03-20 DIAGNOSIS — E1165 Type 2 diabetes mellitus with hyperglycemia: Secondary | ICD-10-CM

## 2023-03-20 DIAGNOSIS — N939 Abnormal uterine and vaginal bleeding, unspecified: Secondary | ICD-10-CM

## 2023-03-20 DIAGNOSIS — Z720 Tobacco use: Secondary | ICD-10-CM

## 2023-03-20 DIAGNOSIS — F17210 Nicotine dependence, cigarettes, uncomplicated: Secondary | ICD-10-CM

## 2023-03-20 DIAGNOSIS — R600 Localized edema: Secondary | ICD-10-CM

## 2023-03-20 DIAGNOSIS — Z7984 Long term (current) use of oral hypoglycemic drugs: Secondary | ICD-10-CM

## 2023-03-20 DIAGNOSIS — R6889 Other general symptoms and signs: Secondary | ICD-10-CM

## 2023-03-20 LAB — POCT GLYCOSYLATED HEMOGLOBIN (HGB A1C): Hemoglobin A1C: 7.1 % — AB (ref 4.0–5.6)

## 2023-03-20 LAB — GLUCOSE, CAPILLARY: Glucose-Capillary: 136 mg/dL — ABNORMAL HIGH (ref 70–99)

## 2023-03-20 MED ORDER — NICOTINE 14 MG/24HR TD PT24
14.0000 mg | MEDICATED_PATCH | TRANSDERMAL | 0 refills | Status: AC
  Filled 2023-03-20: qty 28, 28d supply, fill #0

## 2023-03-20 MED ORDER — METFORMIN HCL 500 MG PO TABS
500.0000 mg | ORAL_TABLET | Freq: Every day | ORAL | 3 refills | Status: DC
  Filled 2023-03-20: qty 30, 30d supply, fill #0
  Filled 2023-04-24: qty 30, 30d supply, fill #1
  Filled 2023-06-04: qty 30, 30d supply, fill #2
  Filled 2023-07-15: qty 30, 30d supply, fill #3
  Filled 2023-08-25 – 2023-09-07 (×2): qty 30, 30d supply, fill #4

## 2023-03-20 NOTE — Assessment & Plan Note (Signed)
Patient had previously reported postmenopausal bleeding. Today, she endorses complete resolution of this. I had sent a referral to gynecology but I do not believe this is needed anymore.

## 2023-03-20 NOTE — Assessment & Plan Note (Addendum)
Patient presents with history of type 2 diabetes.  A1c 7 at last follow-up.  This was with metformin 500 mg daily and dietary changes. However, patient states she was told not to take metformin at last f/u. Denies polyuria, polydipsia. -f/u repeat A1c

## 2023-03-20 NOTE — Assessment & Plan Note (Signed)
Endorses smoking a pack a day for several decades. Reports not having a presciption for Chantix previously and thus has not been taking this. Endorses eagerness to quit smoking. -trial chantix -f/u in 3 months

## 2023-03-20 NOTE — Patient Instructions (Addendum)
[name], it was a pleasure seeing you today!  Today we discussed: Diabetes - Will call with A1c result Vaginal bleeding - call OBGYN to schedule appointment Mammogram - call number provided to schedule Smoking cessation - use nicotine patch once daily. Try getting some nicorette gum at pharmacy as well.  If you get insurance, give [place] a call to schedule appointment earlier than 3 months   I have ordered the following labs today:   Lab Orders         Glucose, capillary         POC Hbg A1C      Tests ordered today:  ABI  Referrals ordered today:   Referral Orders  No referral(s) requested today     I have ordered the following medication/changed the following medications:   Stop the following medications: There are no discontinued medications.   Start the following medications: No orders of the defined types were placed in this encounter.    Follow-up: 3 months   Please make sure to arrive 15 minutes prior to your next appointment. If you arrive late, you may be asked to reschedule.   We look forward to seeing you next time. Please call our clinic at [phone] if you have any questions or concerns. The best time to call is Monday-Friday from 9am-4pm, but there is someone available 24/7. If after hours or the weekend, call the main hospital number and ask for the Internal Medicine Resident On-Call. If you need medication refills, please notify your pharmacy one week in advance and they will send [place] a request.  Thank you for letting us take part in your care. Wishing you the best!  Thank you, Linward Natal, MD

## 2023-03-20 NOTE — Progress Notes (Unsigned)
CC: Diabetes follow-up  HPI:  [name] is a 57 y.o. female with past medical history as below who presents for diabetes follow-up.  Please see detailed assessment and plan for HPI.  Past Medical History:  Diagnosis Date   Acute CVA (cerebrovascular accident) 03/10/2016   Aneurysm, cerebral, nonruptured 03/10/2016   Asthma    Bradycardia    Headache    hx   Right sided cerebral infarction 04/23/2016   Stroke    Tachypnea    Tobacco abuse    Transient cerebral ischemia    UTI (lower urinary tract infection) 03/09/2016   Review of Systems: See detailed assessment and plan for pertinent ROS  Physical Exam:  Vitals:   03/20/23 1502  BP: 125/62  Pulse: 89  Temp: 98.2 F (36.8 C)  TempSrc: Oral  SpO2: 98%  Weight: 260 lb 3.2 oz (118 kg)  Height: 5\' 3"  (1.6 m)   Physical Exam Constitutional:      General: She is not in acute distress. HENT:     Head: Normocephalic and atraumatic.  Eyes:     Extraocular Movements: Extraocular movements intact.  Cardiovascular:     Rate and Rhythm: Normal rate and regular rhythm.     Pulses:          Dorsalis pedis pulses are detected w/ Doppler on the right side and detected w/ Doppler on the left side.     Heart sounds: No murmur heard. Pulmonary:     Effort: Pulmonary effort is normal.     Breath sounds: No wheezing, rhonchi or rales.  Abdominal:     General: There is no distension.     Palpations: Abdomen is soft.     Tenderness: There is no abdominal tenderness.  Musculoskeletal:     Cervical back: Neck supple.     Comments: 1+ pitting edema at right lower extremity. Bilateral lower extremities without erythema, excess warmth, or tenderness to palpation. Venous stasis skin changes at right ankle.  Skin:    General: Skin is warm and dry.  Neurological:     General: No focal deficit present.     Mental Status: She is alert and oriented to person, place, and time.  Psychiatric:        Mood and Affect: Mood normal.         Behavior: Behavior normal.      Assessment & Plan:   See Encounters Tab for problem based charting.  Uncontrolled type 2 diabetes mellitus with hyperglycemia [facility]) Patient presents with history of type 2 diabetes.  A1c 7 at last follow-up.  This was with metformin 500 mg daily and dietary changes. However, patient states she was told not to take metformin at last f/u. Denies polyuria, polydipsia. Repeat A1c today 7.1. -resume metformin 500 mg daily -return in 3 months for f/u  Abnormal uterine bleeding (AUB) Patient had previously reported postmenopausal bleeding. Today, she endorses complete resolution of this. I do believe this is postmenopausal as she reports having gone more than 1 year without bleeding. She has however not bled for over 6 months now. Despite resolution, still feel this warrants further GYN evaluation. -recommended she move forward with scheduling OBGYN appt  Tobacco abuse Endorses smoking a pack a day for several decades. Reports not having a presciption for Chantix previously and thus has not been taking this. Endorses eagerness to quit smoking. -nicotine patch daily. Discussed nicorette gum as well and counseled on proper use.  -f/u in 3 months  Bilateral lower  extremity edema Patient presents with long history of lower leg edema. She states this has been present for at least 7 years. Denies any history of RLE procedures or injuries. Patient does not have history or signs of heart, liver, or kidney failure. Differential includes DVT, venous insufficiency. Favor venous insufficiency given chronicity and lack of pain, erythema or warmth. Advised compression socks and obtained ABIs to rule out PAD before use. Test demonstrated PAD bilaterally. Able to confirm pedal pulses bilaterally with Doppler. Low suspicion for acute limb ischemia. Patient is already on aspirin and lipitor. Would benefit from formal arterial studies but currently without insurance. Given she is  already on PAD therapy (hx of stroke), will defer to future visit for referral. -hold compression socks until possible PAD further evaluated -continue aspirin, statin -discussed return precautions re DVT sxs  Abnormal ankle brachial index (ABI) Obtained ABIs to rule out PAD before recommending compression socks. Test demonstrated PAD bilaterally despite low clinical suspicion otherwise. Able to confirm pedal pulses bilaterally with Doppler. Low suspicion for acute limb ischemia. Patient is already on aspirin and lipitor. Would benefit from formal arterial studies but currently without insurance. Given she is already on PAD therapy (hx of stroke), will defer to future visit for referral.  -continue aspirin, statin -return in 3 months or sooner if obtains insurance -referral for formal arterial studies once insured    Patient discussed with Dr.  Saverio Danker

## 2023-03-21 DIAGNOSIS — R6889 Other general symptoms and signs: Secondary | ICD-10-CM | POA: Insufficient documentation

## 2023-03-21 DIAGNOSIS — I739 Peripheral vascular disease, unspecified: Secondary | ICD-10-CM | POA: Insufficient documentation

## 2023-03-21 NOTE — Progress Notes (Signed)
Internal Medicine Clinic Attending  Case discussed with Dr. White  At the time of the visit.  We reviewed the resident's history and exam and pertinent patient test results.  I agree with the assessment, diagnosis, and plan of care documented in the resident's note.  

## 2023-03-21 NOTE — Assessment & Plan Note (Addendum)
Obtained ABIs to rule out PAD before recommending compression socks. Test demonstrated PAD bilaterally despite low clinical suspicion otherwise. Able to confirm pedal pulses bilaterally with Doppler. Low suspicion for acute limb ischemia. Patient is already on aspirin and lipitor. Would benefit from formal arterial studies but currently without insurance. Given she is already on PAD therapy (hx of stroke), will defer to future visit for referral.  -continue aspirin, statin -return in 3 months or sooner if obtains insurance -referral for formal arterial studies once insured

## 2023-03-21 NOTE — Assessment & Plan Note (Addendum)
Patient presents with long history of lower leg edema. She states this has been present for at least 7 years. Denies any history of RLE procedures or injuries. Patient does not have history or signs of heart, liver, or kidney failure. Differential includes DVT, venous insufficiency. Favor venous insufficiency given chronicity and lack of pain, erythema or warmth. Advised compression socks and obtained ABIs to rule out PAD before use. Test demonstrated PAD bilaterally. Able to confirm pedal pulses bilaterally with Doppler. Low suspicion for acute limb ischemia. Patient is already on aspirin and lipitor. Would benefit from formal arterial studies but currently without insurance. Given she is already on PAD therapy (hx of stroke), will defer to future visit for referral. -hold compression socks until possible PAD further evaluated -continue aspirin, statin -discussed return precautions re DVT sxs

## 2023-04-04 ENCOUNTER — Other Ambulatory Visit: Payer: Self-pay | Admitting: Obstetrics and Gynecology

## 2023-04-04 DIAGNOSIS — Z1231 Encounter for screening mammogram for malignant neoplasm of breast: Secondary | ICD-10-CM

## 2023-04-24 ENCOUNTER — Other Ambulatory Visit: Payer: Self-pay | Admitting: Internal Medicine

## 2023-04-24 MED ORDER — ATORVASTATIN CALCIUM 40 MG PO TABS
40.0000 mg | ORAL_TABLET | Freq: Every day | ORAL | 11 refills | Status: DC
  Filled 2023-04-24: qty 30, 30d supply, fill #0
  Filled 2023-06-04: qty 30, 30d supply, fill #1
  Filled 2023-07-15: qty 30, 30d supply, fill #2
  Filled 2023-08-25 – 2023-09-07 (×2): qty 30, 30d supply, fill #3
  Filled 2023-10-15: qty 30, 30d supply, fill #4
  Filled 2023-11-19 – 2023-12-10 (×2): qty 30, 30d supply, fill #5
  Filled 2024-01-11 – 2024-02-09 (×2): qty 30, 30d supply, fill #6

## 2023-04-25 ENCOUNTER — Other Ambulatory Visit (HOSPITAL_COMMUNITY): Payer: Self-pay

## 2023-05-10 ENCOUNTER — Ambulatory Visit: Payer: Self-pay

## 2023-05-10 ENCOUNTER — Inpatient Hospital Stay: Admission: RE | Admit: 2023-05-10 | Payer: Self-pay | Source: Ambulatory Visit

## 2023-05-30 ENCOUNTER — Other Ambulatory Visit (HOSPITAL_BASED_OUTPATIENT_CLINIC_OR_DEPARTMENT_OTHER): Payer: Self-pay

## 2023-06-07 ENCOUNTER — Ambulatory Visit: Payer: Self-pay

## 2023-06-07 ENCOUNTER — Inpatient Hospital Stay: Admission: RE | Admit: 2023-06-07 | Payer: Self-pay | Source: Ambulatory Visit

## 2023-06-08 ENCOUNTER — Other Ambulatory Visit (HOSPITAL_COMMUNITY): Payer: Self-pay

## 2023-07-15 ENCOUNTER — Other Ambulatory Visit: Payer: Self-pay | Admitting: Internal Medicine

## 2023-07-16 ENCOUNTER — Other Ambulatory Visit (HOSPITAL_COMMUNITY): Payer: Self-pay

## 2023-07-16 MED ORDER — ASPIRIN 81 MG PO TBEC
81.0000 mg | DELAYED_RELEASE_TABLET | Freq: Every day | ORAL | 2 refills | Status: DC
  Filled 2023-07-16: qty 30, 30d supply, fill #0
  Filled 2023-08-25 – 2023-09-07 (×2): qty 30, 30d supply, fill #1
  Filled 2023-10-15: qty 30, 30d supply, fill #2
  Filled 2023-11-19 – 2023-12-10 (×2): qty 30, 30d supply, fill #3
  Filled 2024-01-11 – 2024-02-09 (×2): qty 30, 30d supply, fill #4
  Filled 2024-03-24: qty 30, 30d supply, fill #5
  Filled 2024-04-02 – 2024-05-19 (×2): qty 30, 30d supply, fill #6
  Filled 2024-07-02: qty 30, 30d supply, fill #7

## 2023-08-25 ENCOUNTER — Other Ambulatory Visit (HOSPITAL_COMMUNITY): Payer: Self-pay

## 2023-08-27 ENCOUNTER — Other Ambulatory Visit: Payer: Self-pay

## 2023-08-27 ENCOUNTER — Other Ambulatory Visit (HOSPITAL_COMMUNITY): Payer: Self-pay

## 2023-08-28 ENCOUNTER — Encounter: Payer: Self-pay | Admitting: Student

## 2023-09-06 ENCOUNTER — Other Ambulatory Visit (HOSPITAL_COMMUNITY): Payer: Self-pay

## 2023-09-07 ENCOUNTER — Other Ambulatory Visit (HOSPITAL_COMMUNITY): Payer: Self-pay

## 2023-09-18 ENCOUNTER — Encounter: Payer: Self-pay | Admitting: Student

## 2023-09-18 NOTE — Progress Notes (Deleted)
   CC: Diabetes follow-up  HPI:  [name] is a 57 y.o. female living with a history stated below and presents today for ***. Please see problem based assessment and plan for additional details.  Past Medical History:  Diagnosis Date   Acute CVA (cerebrovascular accident) ([facility]) 03/10/2016   Aneurysm, cerebral, nonruptured 03/10/2016   Asthma    Bradycardia    Headache    hx   Right sided cerebral infarction ([facility]) 04/23/2016   Stroke ([facility])    Tachypnea    Tobacco abuse    Transient cerebral ischemia    UTI (lower urinary tract infection) 03/09/2016    Current Outpatient Medications on File Prior to Visit  Medication Sig Dispense Refill   aspirin EC 81 MG tablet Take 1 tablet (81 mg total) by mouth daily. Swallow whole. 150 tablet 2   atorvastatin (LIPITOR) 40 MG tablet Take 1 tablet (40 mg total) by mouth daily. 30 tablet 11   Blood Glucose Monitoring Suppl (CONTOUR NEXT ONE) DEVI Use as directed once daily. 1 each 0   diphenhydrAMINE (BENADRYL) 50 MG capsule Take 50 mg by mouth every 6 (six) hours as needed for itching.     glucose blood (CONTOUR NEXT TEST) test strip Use to check blood sugar once daily. 100 each 12   metFORMIN (GLUCOPHAGE) 500 MG tablet Take 1 tablet (500 mg total) by mouth daily. 90 tablet 3   Microlet Lancets MISC Use to check blood sugar once daily. 100 each 0   nystatin powder Apply 1 Application topically 3 (three) times daily. 15 g 0   No current facility-administered medications on file prior to visit.     Review of Systems: ROS negative except for what is noted on the assessment and plan.  There were no vitals filed for this visit.  Physical Exam: Constitutional: Well appearing, not in acute distress. Cardiovascular: regular rate and rhythm, no m/r/g Pulmonary/Chest: normal work of breathing on room air, lungs clear to auscultation bilaterally Abdominal: soft, non-tender, non-distended  Assessment & Plan:   Type 2 diabetes A1c.  Denies  polyuria polydipsia.  Urine ACR (6) within normal limits.  -Document foot exam and results. -Diabetic retinopathy referral. -Continue metformin 500 mg -Repeat A1c  Tobacco abuse Last OV, patient was prescribed nicotine gums and patches.  She is currently smoking ***   -Bilateral lower extremity edema Differential here includes DVT versus venous insufficiency.  Venous insufficiency given chronicity, lack of pain erythema warmth.  -Continue aspirin and statin    Health care maintenance:   Patient {GC/[id number]::"discussed with","seen with"} Dr. {[id number]::"HQIONGEX","B. Hoffman","Mullen","Narendra","Vincent","Guilloud","Lau","Machen"}  No problem-specific Assessment & Plan notes found for this encounter.   Laretta Bolster, MD [facility], PGY-1 Phone: [phone] Date 09/18/2023 Time 9:18 PM

## 2023-09-19 ENCOUNTER — Encounter: Payer: Self-pay | Admitting: Student

## 2023-10-10 ENCOUNTER — Ambulatory Visit (INDEPENDENT_AMBULATORY_CARE_PROVIDER_SITE_OTHER): Payer: Self-pay | Admitting: Internal Medicine

## 2023-10-10 ENCOUNTER — Encounter: Payer: Self-pay | Admitting: Internal Medicine

## 2023-10-10 ENCOUNTER — Other Ambulatory Visit (HOSPITAL_COMMUNITY): Payer: Self-pay

## 2023-10-10 VITALS — BP 138/61 | HR 80 | Temp 98.2°F | Ht 64.0 in | Wt 262.6 lb

## 2023-10-10 DIAGNOSIS — Z7984 Long term (current) use of oral hypoglycemic drugs: Secondary | ICD-10-CM

## 2023-10-10 DIAGNOSIS — E041 Nontoxic single thyroid nodule: Secondary | ICD-10-CM

## 2023-10-10 DIAGNOSIS — R600 Localized edema: Secondary | ICD-10-CM

## 2023-10-10 DIAGNOSIS — Z720 Tobacco use: Secondary | ICD-10-CM

## 2023-10-10 DIAGNOSIS — E1165 Type 2 diabetes mellitus with hyperglycemia: Secondary | ICD-10-CM

## 2023-10-10 DIAGNOSIS — R221 Localized swelling, mass and lump, neck: Secondary | ICD-10-CM | POA: Diagnosis not present

## 2023-10-10 DIAGNOSIS — E119 Type 2 diabetes mellitus without complications: Secondary | ICD-10-CM

## 2023-10-10 DIAGNOSIS — F17210 Nicotine dependence, cigarettes, uncomplicated: Secondary | ICD-10-CM

## 2023-10-10 DIAGNOSIS — E049 Nontoxic goiter, unspecified: Secondary | ICD-10-CM | POA: Insufficient documentation

## 2023-10-10 LAB — POCT GLYCOSYLATED HEMOGLOBIN (HGB A1C): Hemoglobin A1C: 7.1 % — AB (ref 4.0–5.6)

## 2023-10-10 LAB — GLUCOSE, CAPILLARY: Glucose-Capillary: 172 mg/dL — ABNORMAL HIGH (ref 70–99)

## 2023-10-10 MED ORDER — METFORMIN HCL ER 500 MG PO TB24
1000.0000 mg | ORAL_TABLET | Freq: Every day | ORAL | 3 refills | Status: DC
Start: 2023-10-10 — End: 2024-06-16
  Filled 2023-10-10: qty 60, 30d supply, fill #0
  Filled 2023-11-08: qty 60, 30d supply, fill #1
  Filled 2023-12-31 – 2024-02-09 (×3): qty 60, 30d supply, fill #2
  Filled 2024-03-24: qty 60, 30d supply, fill #3

## 2023-10-10 MED ORDER — VARENICLINE TARTRATE 0.5 MG PO TABS
0.5000 mg | ORAL_TABLET | Freq: Two times a day (BID) | ORAL | 2 refills | Status: DC
Start: 2023-10-10 — End: 2024-05-26
  Filled 2023-10-10: qty 60, 30d supply, fill #0

## 2023-10-10 NOTE — Assessment & Plan Note (Signed)
Patient notes cutting down from 1 pack per day to 1/2 pack per day. She is interested in quitting completely and feels that nicotine patches are not helpful. We discussed Chantix, which she was amenable to starting -Varenicline .5mg  tablet BID ordered

## 2023-10-10 NOTE — Progress Notes (Signed)
Subjective:  CC: diabetes follow up, neck swelling  HPI:  Erika Wong is a 57 y.o. female with a past medical history stated below and presents today for above. Please see problem based assessment and plan for additional details.  Past Medical History:  Diagnosis Date   Acute CVA (cerebrovascular accident) ([facility]) 03/10/2016   Aneurysm, cerebral, nonruptured 03/10/2016   Asthma    Bradycardia    Headache    hx   Right sided cerebral infarction ([facility]) 04/23/2016   Stroke ([facility])    Tachypnea    Tobacco abuse    Transient cerebral ischemia    UTI (lower urinary tract infection) 03/09/2016    Current Outpatient Medications on File Prior to Visit  Medication Sig Dispense Refill   aspirin EC 81 MG tablet Take 1 tablet (81 mg total) by mouth daily. Swallow whole. 150 tablet 2   atorvastatin (LIPITOR) 40 MG tablet Take 1 tablet (40 mg total) by mouth daily. 30 tablet 11   Blood Glucose Monitoring Suppl (CONTOUR NEXT ONE) DEVI Use as directed once daily. 1 each 0   diphenhydrAMINE (BENADRYL) 50 MG capsule Take 50 mg by mouth every 6 (six) hours as needed for itching.     glucose blood (CONTOUR NEXT TEST) test strip Use to check blood sugar once daily. 100 each 12   Microlet Lancets MISC Use to check blood sugar once daily. 100 each 0   nystatin powder Apply 1 Application topically 3 (three) times daily. 15 g 0   No current facility-administered medications on file prior to visit.    Review of Systems: ROS negative except for as is noted on the assessment and plan.  Objective:   Vitals:   10/10/23 1440  BP: 138/61  Pulse: 80  Temp: 98.2 F (36.8 C)  TempSrc: Oral  SpO2: 97%  Weight: 262 lb 9.6 oz (119.1 kg)  Height: 5\' 4"  (1.626 m)    Physical Exam: Constitutional: well-appearing, in no acute distress HENT: normocephalic atraumatic, mucous membranes moist Eyes: conjunctiva non-erythematous Neck: Approximately 4cm soft, mobile mass detected on right side of  thyroid Cardiovascular: regular rate and rhythm, no m/r/g Pulmonary/Chest: normal work of breathing on room air, lungs clear to auscultation bilaterally Abdominal: soft, non-tender, non-distended MSK: non-pitting edema of both legs to mid thigh, more severe on left leg. Palpable bilateral DP pulses Neurological: alert & oriented x 3, 5/5 strength in bilateral upper and lower extremities, normal gait Skin: warm and dry  Assessment & Plan:   Neck swelling Patient has had right sided neck swelling for approximately the last month. The area is not painful or inflamed, and she hasn't had any difficulty swallowing or speaking. She said she had a similar issue several years ago which resolved on its own. Patient endorses some heat intolerance and insomnia, denies irritability, diarrhea, tachycardia. Chart review shows an ultrasound was performed in 2011 which revealed a 4.0 cm thyroid nodule. 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  -TSH, thyroid ultrasound ordered  Type 2 diabetes mellitus, without long-term current use of insulin ([facility]) A1c today is 7.1, same as last A1c 6 months ago. Current regimen includes metformin 500mg  daily. Patient could not tolerate increased dose in the past due to GI side effects. Patient is amenable to trying increased dosage with extended release formulation  -Metformin 1000mg  XR prescribed -Urine MCR, BMP, lipid profile today -Referral to ophthalmology for eye exam  Bilateral lower extremity edema Patient has had bilateral LEE for many months, however in  the last couple months her right leg has been more swollen than her left. Patient denies any discomfort, weakness, or numbness in either leg. She has no pain after walking long distances. Bilateral palpable DP pulses, both feet warm and well-perfused. Denies dyspnea on exertion or nocturnal orthopnea. Increasing edema is likely due to chronic venous insufficiency vs peripheral vascular disease. Low  concern for DVT, CHF -Recommend compression stocking for symptomatic control  Tobacco abuse Patient notes cutting down from 1 pack per day to 1/2 pack per day. She is interested in quitting completely and feels that nicotine patches are not helpful. We discussed Chantix, which she was amenable to starting -Varenicline .5mg  tablet BID ordered    Patient seen with Dr. Melida Quitter MD [facility]  PGY-1 Pager: [phone] Date 10/10/2023  Time 3:47 PM

## 2023-10-10 NOTE — Assessment & Plan Note (Addendum)
A1c today is 7.1, same as last A1c 6 months ago. Current regimen includes metformin 500mg  daily. Patient could not tolerate increased dose in the past due to GI side effects. Patient is amenable to trying increased dosage with extended release formulation  -Metformin 1000mg  XR prescribed -Urine MCR, BMP, lipid profile today -Referral to ophthalmology for eye exam

## 2023-10-10 NOTE — Assessment & Plan Note (Addendum)
Patient has had bilateral LEE for many months, however in the last couple months her right leg has been more swollen than her left. Patient denies any discomfort, weakness, or numbness in either leg. She has no pain after walking long distances. Bilateral palpable DP pulses, both feet warm and well-perfused. Denies dyspnea on exertion or nocturnal orthopnea. Increasing edema is likely due to chronic venous insufficiency vs peripheral vascular disease. Low concern for DVT, CHF -Recommend compression stocking for symptomatic control

## 2023-10-10 NOTE — Assessment & Plan Note (Signed)
Patient has had right sided neck swelling for approximately the last month. The area is not painful or inflamed, and she hasn't had any difficulty swallowing or speaking. She said she had a similar issue several years ago which resolved on its own. Patient endorses some heat intolerance and insomnia, denies irritability, diarrhea, tachycardia. Chart review shows an ultrasound was performed in 2011 which revealed a 4.0 cm thyroid nodule. 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  -TSH, thyroid ultrasound ordered

## 2023-10-10 NOTE — Patient Instructions (Addendum)
Erika Wong,   It was a pleasure meeting you today.   For your diabetes, please continue to take your metformin, but 1000mg  of the extended release medication. Hopefully you will not have any GI side effects with this form of the medication. I am also getting some bloodwork and a referral to ophthalmology for an eye exam.   I have also prescribed a medication called Chantix to help with smoking.   For your leg swelling, I recommend a compression stocking.   For your neck swelling, I am ordering some bloodwork and an ultrasound. Please be on the lookout for a call from the scheduler.   Thanks,  Dr Carlynn Purl

## 2023-10-11 LAB — LIPID PANEL
Chol/HDL Ratio: 2.2 ratio (ref 0.0–4.4)
Cholesterol, Total: 136 mg/dL (ref 100–199)
HDL: 62 mg/dL (ref >39)
LDL Chol Calc (NIH): 58 mg/dL (ref 0–99)
Triglycerides: 84 mg/dL (ref 0–149)
VLDL Cholesterol Cal: 16 mg/dL (ref 5–40)

## 2023-10-11 LAB — BMP8+ANION GAP
Anion Gap: 16 mmol/L (ref 10.0–18.0)
BUN/Creatinine Ratio: 16 (ref 9–23)
BUN: 12 mg/dL (ref 6–24)
CO2: 20 mmol/L (ref 20–29)
Calcium: 9.5 mg/dL (ref 8.7–10.2)
Chloride: 102 mmol/L (ref 96–106)
Creatinine, Ser: 0.75 mg/dL (ref 0.57–1.00)
Glucose: 187 mg/dL — ABNORMAL HIGH (ref 70–99)
Potassium: 4.3 mmol/L (ref 3.5–5.2)
Sodium: 138 mmol/L (ref 134–144)
eGFR: 92 mL/min/{1.73_m2} (ref >59)

## 2023-10-11 LAB — TSH: TSH: 0.715 u[IU]/mL (ref 0.450–4.500)

## 2023-10-12 LAB — MICROALBUMIN / CREATININE URINE RATIO
Creatinine, Urine: 125.2 mg/dL
Microalb/Creat Ratio: <2 mg/g{creat} (ref 0–29)
Microalbumin, Urine: <3 ug/mL

## 2023-10-16 ENCOUNTER — Other Ambulatory Visit (HOSPITAL_COMMUNITY): Payer: Self-pay

## 2023-10-17 ENCOUNTER — Telehealth: Payer: Self-pay

## 2023-10-17 NOTE — Telephone Encounter (Signed)
Telephoned patient at mobile number. Left a voice message with [facility] contact information. 

## 2023-10-22 NOTE — Progress Notes (Signed)
Internal Medicine Clinic Attending  I was physically present during the key portions of the resident provided service and participated in the medical decision making of patient's management care. I reviewed pertinent patient test results.  The assessment, diagnosis, and plan were formulated together and I agree with the documentation in the resident's note, though asymmetric peripheral edema would not be attributable to PAD. No concern for DVT on exam and no knee or hip arthritis on edematous side. Monitor.   Miguel Aschoff, MD

## 2023-11-12 ENCOUNTER — Other Ambulatory Visit (HOSPITAL_COMMUNITY): Payer: Self-pay

## 2023-11-13 ENCOUNTER — Ambulatory Visit (HOSPITAL_COMMUNITY): Payer: Medicaid Other

## 2023-11-29 ENCOUNTER — Other Ambulatory Visit (HOSPITAL_COMMUNITY): Payer: Self-pay

## 2023-12-10 ENCOUNTER — Other Ambulatory Visit (HOSPITAL_COMMUNITY): Payer: Self-pay

## 2024-01-01 ENCOUNTER — Other Ambulatory Visit (HOSPITAL_COMMUNITY): Payer: Self-pay

## 2024-01-11 ENCOUNTER — Other Ambulatory Visit (HOSPITAL_COMMUNITY): Payer: Self-pay

## 2024-01-23 ENCOUNTER — Encounter: Payer: Medicaid Other | Admitting: Student

## 2024-01-23 ENCOUNTER — Other Ambulatory Visit (HOSPITAL_COMMUNITY): Payer: Self-pay

## 2024-01-23 NOTE — Progress Notes (Deleted)
 Established Patient Office Visit  Subjective   Patient ID: Erika Wong, female    DOB: Jun 28, 1966  Age: 58 y.o. MRN: [id number]  No chief complaint on file.   Erika Wong is a 58 y.o. who presents to the clinic for ***. Please see problem based assessment and plan for additional details.   Patient Active Problem List   Diagnosis Date Noted   Neck swelling 10/10/2023   Abnormal ankle brachial index (ABI) 03/21/2023   Abnormal uterine bleeding (AUB) 08/17/2022   Type 2 diabetes mellitus, without long-term current use of insulin (HCC) 08/16/2022   Bilateral lower extremity edema 03/29/2022   Cerebral aneurysm, nonruptured 06/09/2016   History of CVA (cerebrovascular accident) 03/27/2016   Morbid obesity (HCC) 03/10/2016   Tobacco abuse       Objective:     LMP 02/01/2016 Comment: 2 daya BP Readings from Last 3 Encounters:  10/10/23 138/61  03/20/23 125/62  11/30/22 132/68   Wt Readings from Last 3 Encounters:  10/10/23 262 lb 9.6 oz (119.1 kg)  03/20/23 260 lb 3.2 oz (118 kg)  11/30/22 248 lb (112.5 kg)      Physical Exam   No results found for any visits on 01/23/24.  Last metabolic panel Lab Results  Component Value Date   GLUCOSE 187 (H) 10/10/2023   NA 138 10/10/2023   K 4.3 10/10/2023   CL 102 10/10/2023   CO2 20 10/10/2023   BUN 12 10/10/2023   CREATININE 0.75 10/10/2023   EGFR 92 10/10/2023   CALCIUM  9.5 10/10/2023   PROT 6.1 10/26/2017   ALBUMIN 3.5 (L) 06/18/2017   BILITOT 0.5 10/26/2017   ALKPHOS 103 06/18/2017   AST 36 (H) 10/26/2017   ALT 26 10/26/2017   ANIONGAP 11 08/16/2022   Last lipids Lab Results  Component Value Date   CHOL 136 10/10/2023   HDL 62 10/10/2023   LDLCALC 58 10/10/2023   TRIG 84 10/10/2023   CHOLHDL 2.2 10/10/2023   Last hemoglobin A1c Lab Results  Component Value Date   HGBA1C 7.1 (A) 10/10/2023      The ASCVD Risk score (Arnett DK, et al., 2019) failed to calculate for the following reasons:   Risk  score cannot be calculated because patient has a medical history suggesting prior/existing ASCVD    Assessment & Plan:   Problem List Items Addressed This Visit   None  Last seen in October: Patient presents with a history of T2DM with a prior A1c of 7.1 in 09/2023.  Patient's A1c today is***.  They are on a regimen of metformin  XR 1000mg  ,***,***.  Patient***hypoglycemia.  Per review of CGM, average blood glucose is***.  Average fasting blood glucose is***. Plan: -Continue regimen of: -A1c today -Ophthalmology referral:  -Foot exam -Urine ACR     Bilateral lower extremity edema Patient has had bilateral LEE for many months, however in the last couple months her right leg has been more swollen than her left. Patient denies any discomfort, weakness, or numbness in either leg. She has no pain after walking long distances. Bilateral palpable DP pulses, both feet warm and well-perfused. Denies dyspnea on exertion or nocturnal orthopnea. Increasing edema is likely due to chronic venous insufficiency vs peripheral vascular disease. Low concern for DVT, CHF -Recommend compression stocking for symptomatic control   Tobacco abuse Helping??? Patient notes cutting down from 1 pack per day to 1/2 pack per day. She is interested in quitting completely and feels that nicotine  patches are not helpful. We  discussed Chantix , which she was amenable to starting -Varenicline  .5mg  tablet BID ordered  No follow-ups on file.    Damien Lease, DO

## 2024-01-28 ENCOUNTER — Other Ambulatory Visit (HOSPITAL_COMMUNITY): Payer: Self-pay

## 2024-02-11 ENCOUNTER — Other Ambulatory Visit (HOSPITAL_COMMUNITY): Payer: Self-pay

## 2024-03-03 ENCOUNTER — Encounter: Admitting: Student

## 2024-03-12 ENCOUNTER — Encounter: Admitting: Student

## 2024-03-19 ENCOUNTER — Ambulatory Visit: Admitting: Student

## 2024-03-19 ENCOUNTER — Encounter: Payer: Self-pay | Admitting: Student

## 2024-03-19 ENCOUNTER — Telehealth: Payer: Self-pay | Admitting: *Deleted

## 2024-03-19 ENCOUNTER — Other Ambulatory Visit (HOSPITAL_COMMUNITY): Payer: Self-pay

## 2024-03-19 VITALS — BP 136/62 | HR 86 | Temp 98.4°F | Ht 64.0 in | Wt 263.8 lb

## 2024-03-19 DIAGNOSIS — Z720 Tobacco use: Secondary | ICD-10-CM

## 2024-03-19 DIAGNOSIS — E119 Type 2 diabetes mellitus without complications: Secondary | ICD-10-CM | POA: Diagnosis not present

## 2024-03-19 DIAGNOSIS — F17210 Nicotine dependence, cigarettes, uncomplicated: Secondary | ICD-10-CM | POA: Diagnosis not present

## 2024-03-19 DIAGNOSIS — Z8673 Personal history of transient ischemic attack (TIA), and cerebral infarction without residual deficits: Secondary | ICD-10-CM | POA: Diagnosis not present

## 2024-03-19 DIAGNOSIS — Z1231 Encounter for screening mammogram for malignant neoplasm of breast: Secondary | ICD-10-CM

## 2024-03-19 DIAGNOSIS — Z7985 Long-term (current) use of injectable non-insulin antidiabetic drugs: Secondary | ICD-10-CM

## 2024-03-19 DIAGNOSIS — Z1211 Encounter for screening for malignant neoplasm of colon: Secondary | ICD-10-CM

## 2024-03-19 DIAGNOSIS — E049 Nontoxic goiter, unspecified: Secondary | ICD-10-CM | POA: Diagnosis not present

## 2024-03-19 DIAGNOSIS — Z122 Encounter for screening for malignant neoplasm of respiratory organs: Secondary | ICD-10-CM

## 2024-03-19 DIAGNOSIS — Z794 Long term (current) use of insulin: Secondary | ICD-10-CM

## 2024-03-19 DIAGNOSIS — Z7984 Long term (current) use of oral hypoglycemic drugs: Secondary | ICD-10-CM

## 2024-03-19 LAB — GLUCOSE, CAPILLARY: Glucose-Capillary: 170 mg/dL — ABNORMAL HIGH (ref 70–99)

## 2024-03-19 LAB — POCT GLYCOSYLATED HEMOGLOBIN (HGB A1C): Hemoglobin A1C: 7 % — AB (ref 4.0–5.6)

## 2024-03-19 MED ORDER — SEMAGLUTIDE 3 MG PO TABS
3.0000 mg | ORAL_TABLET | Freq: Every day | ORAL | 1 refills | Status: DC
  Filled 2024-03-19: qty 30, 30d supply, fill #0

## 2024-03-19 MED ORDER — ATORVASTATIN CALCIUM 40 MG PO TABS
40.0000 mg | ORAL_TABLET | Freq: Every day | ORAL | 11 refills | Status: AC
  Filled 2024-03-19 – 2024-04-02 (×2): qty 30, 30d supply, fill #0
  Filled 2024-05-19: qty 30, 30d supply, fill #1
  Filled 2024-07-02: qty 30, 30d supply, fill #2
  Filled 2024-08-04 – 2024-08-25 (×2): qty 30, 30d supply, fill #3
  Filled 2024-09-30: qty 30, 30d supply, fill #4
  Filled 2024-11-04 – 2024-11-14 (×2): qty 30, 30d supply, fill #5
  Filled 2024-12-15 – 2024-12-28 (×2): qty 30, 30d supply, fill #6

## 2024-03-19 MED ORDER — SEMAGLUTIDE 1.5 MG PO TABS
1.5000 mg | ORAL_TABLET | Freq: Every day | ORAL | 3 refills | Status: DC
  Filled 2024-03-19: qty 30, fill #0

## 2024-03-19 NOTE — Assessment & Plan Note (Signed)
 R sided infarction in 2017. Residual deficits of memory impairment, generalized weakness, dizziness, "head floating" sensation, and word finding. Stable, however. Remains on aspirin. She does not believe she has ever taken atorvastatin but I see intermittent fills. I have refilled and asked her to continue the medicine. LDL at lab draw last fall was at goal for secondary prevention, at 58. - Atorvastatin 40 daily - Asa 81 daily

## 2024-03-19 NOTE — Progress Notes (Addendum)
   CC: 6 month follow up  HPI:  [name] is a 58 y.o. female with a PMH stated below who presents today for general checkup. She is without acute concern or complaint. She does not report medication side effects.  Please see problem based assessment and plan for additional details.  Past Medical History:  Diagnosis Date   Acute CVA (cerebrovascular accident) (HCC) 03/10/2016   Aneurysm, cerebral, nonruptured 03/10/2016   Asthma    Bradycardia    Headache    hx   Right sided cerebral infarction (HCC) 04/23/2016   Stroke (HCC)    Tachypnea    Tobacco abuse    Transient cerebral ischemia    UTI (lower urinary tract infection) 03/09/2016    Review of Systems: ROS negative except for what is noted on the assessment and plan.  Vitals:   03/19/24 1504  BP: 136/62  Pulse: 86  Temp: 98.4 F (36.9 C)  TempSrc: Oral  SpO2: 98%  Weight: 263 lb 12.8 oz (119.7 kg)  Height: 5\' 4"  (1.626 m)    Physical Exam: Constitutional: well-appearing woman in no acute distress. She is obese. HENT: normocephalic atraumatic, mucous membranes moist. Nontender goiter on R anterolateral neck. Cardiovascular: regular rate and rhythm, no m/r/g Pulmonary/Chest: normal work of breathing on room air, lungs clear to auscultation bilaterally Abdominal: soft, non-tender, non-distended MSK: normal bulk and tone Skin: warm and dry Psych: normal mood and behavior  Assessment & Plan:   Patient discussed with Dr. Mayford Knife  Type 2 diabetes mellitus, without long-term current use of insulin (HCC) A1c is 7.0, same as 6 months ago when metformin dose was increased from 500 to 1000 daily. She continues to take the medicine, the XR version, but has a history of intolerance at high dose and still has occasional GI upset. Given her diabetes and concurrent obesity, I would like to treat her with semaglutide. She prefers to avoid injections so will use oral medicine. - Continue metformin 1000 XR daily - Start  semaglutide 3mg  tabs daily  Tobacco abuse She continues to smoke. Currently 1/2 ppd, down from 1. Extensive history having started as a teenager. She filled last visit's rx of chantix but did not start it. Today she is agreeable to start. - Chantix ramp to 1mg  BID, target quit on day 8 of treatment for 12 total weeks - Ref for CT lung cancer screening  History of CVA (cerebrovascular accident) R sided infarction in 2017. Residual deficits of memory impairment, generalized weakness, dizziness, "head floating" sensation, and word finding. Stable, however. Remains on aspirin. She does not believe she has ever taken atorvastatin but I see intermittent fills. I have refilled and asked her to continue the medicine. LDL at lab draw last fall was at goal for secondary prevention, at 58. - Atorvastatin 40 daily - Asa 81 daily  Goiter Noted on R neck. Nontender and asymptomatic. At last eval, TSH normal. In past, pathology consistent with non-malignant goiter, [place] in 2011 with 4cm nodule. [place] was ordered to follow up at last visit but not completed, and at this point I don't think it necessary so will cancel.  Referrals for colonoscopy, screening mammogram, low-dose CT ordered today.  RTC in 6 weeks to check on new semaglutide, smoking cessation, obesity.  Katheran James, D.O. [facility] Internal Medicine, PGY-1 Phone: [phone] Date 03/19/2024 Time 4:05 PM

## 2024-03-19 NOTE — Addendum Note (Signed)
 Addended by: Katheran James on: 03/19/2024 04:05 PM   Modules accepted: Orders

## 2024-03-19 NOTE — Patient Instructions (Addendum)
 Start semaglutide 1.5mg  daily. Please make an appointment in 6 weeks for a checkup having started the medicine.  Expect many phonecalls - to set up mammogram, colonoscopy, and lung cancer screening.  Start chantix. To use it properly, take it like this: On days 1-3, take 0.5mg  in the morning On days 4-7, take 0.5mg  in the morning and again in the evening On day 8 - STOP smoking On day 8-84 (12 total weeks), take 1mg  in the morning and again in the evening

## 2024-03-19 NOTE — Assessment & Plan Note (Addendum)
 She continues to smoke. Currently 1/2 ppd, down from 1. Extensive history having started as a teenager. She filled last visit's rx of chantix but did not start it. Today she is agreeable to start. - Chantix ramp to 1mg  BID, target quit on day 8 of treatment for 12 total weeks - Ref for CT lung cancer screening

## 2024-03-19 NOTE — Assessment & Plan Note (Addendum)
 A1c is 7.0, same as 6 months ago when metformin dose was increased from 500 to 1000 daily. She continues to take the medicine, the XR version, but has a history of intolerance at high dose and still has occasional GI upset. Given her diabetes and concurrent obesity, I would like to treat her with semaglutide. She prefers to avoid injections so will use oral medicine. - Continue metformin 1000 XR daily - Start semaglutide 3mg  tabs daily

## 2024-03-19 NOTE — Assessment & Plan Note (Signed)
 Noted on R neck. Nontender and asymptomatic. At last eval, TSH normal. In past, pathology consistent with non-malignant goiter, [place] in 2011 with 4cm nodule. [place] was ordered to follow up at last visit but not completed, and at this point I don't think it necessary so will cancel.

## 2024-03-19 NOTE — Telephone Encounter (Signed)
 Mammogram appointment  04-02-2024 @ 4:00 pm to arrive 3:45 pm. Patient with appointment in hand. Patient aware of the 75.00 no show fee.

## 2024-03-20 ENCOUNTER — Telehealth: Payer: Self-pay

## 2024-03-20 ENCOUNTER — Telehealth: Payer: Self-pay | Admitting: *Deleted

## 2024-03-20 ENCOUNTER — Other Ambulatory Visit (HOSPITAL_COMMUNITY): Payer: Self-pay

## 2024-03-20 NOTE — Telephone Encounter (Signed)
 YOU ASKED FOR: Service Description Code 1 Code 2 Plan Requested  Dates Requested  Amount RYBELSUS Tablet [id number] Medicaid 03/20/2024 30 units WE DENIED: Service Description Code 1 Code 2 Plan Denied Dates Denied  Amount RYBELSUS Tablet [id number] Medicaid 03/20/2024 30 units COMMENTS:  Per the health plan preferred drug list, at least 2 preferred drugs must be tried before requesting this drug  or tell us why the member cannot try any preferred alternatives. Please send [place] supporting chart notes and  lab results. Here is list of preferred alternatives: Byetta Pen, Trulicity Pen, Victoza Pen, Ozempic Pen. Note: Some preferred drug(s) may have quantity limits. Refer to the health plan's preferred drug list for  additional details. Additionally, per the health plan guideline, GLP-1 Receptor Agonists and Combinations, the clinical criteria  below must be met before this request can be approved. Please send [place] supporting chart notes and lab  results. For non-preferred products, the beneficiary must have tried and failed or experienced an insufficient  response to at least two preferred products or have a clinical reason that preferred products cannot be  tried.

## 2024-03-20 NOTE — Telephone Encounter (Signed)
 3 mg what? Which medication?

## 2024-03-20 NOTE — Telephone Encounter (Signed)
 Prior Authorization has been submitted awaiting approval or denial.

## 2024-03-20 NOTE — Telephone Encounter (Signed)
 Call to Pharmacy need a PA done for the 3 mg.  [id number].

## 2024-03-20 NOTE — Telephone Encounter (Signed)
 Prior Authorization for patient (Rybelsus 3MG  tablets) came through on cover my meds was submitted with last office notes and labs awaiting approval or denial.  ZOX:[id number]

## 2024-03-25 ENCOUNTER — Other Ambulatory Visit (HOSPITAL_COMMUNITY): Payer: Self-pay

## 2024-03-25 ENCOUNTER — Telehealth: Payer: Self-pay | Admitting: *Deleted

## 2024-03-25 NOTE — Telephone Encounter (Signed)
 Copied from CRM [id number]. Topic: Clinical - Prescription Issue >> Mar 20, 2024  1:52 PM Everette Rank wrote: Reason for CRM: Semaglutide 3 MG TABS-Patient called due to pharmacy stated they do not have that low of a MG needs to be resubmitted to higher MG. Needs cal back when updated [phone] >> Mar 25, 2024  3:42 PM Irine Seal wrote: [employer] called to state that they received the prior authorization but they need additional information, they need a diagnosis as well as clarification and verification that it is not being prescribed along side any other GPL-1  shots    Callback [phone]

## 2024-03-29 NOTE — Progress Notes (Signed)
 Internal Medicine Clinic Attending  Case discussed with the resident at the time of the visit.  We reviewed the resident's history and exam and pertinent patient test results.  I agree with the assessment, diagnosis, and plan of care documented in the resident's note.

## 2024-03-31 ENCOUNTER — Other Ambulatory Visit (HOSPITAL_COMMUNITY): Payer: Self-pay

## 2024-04-02 ENCOUNTER — Ambulatory Visit
Admission: RE | Admit: 2024-04-02 | Discharge: 2024-04-02 | Disposition: A | Discharge status: Home / Self Care | Source: Ambulatory Visit | Attending: Internal Medicine | Admitting: Internal Medicine

## 2024-04-02 ENCOUNTER — Other Ambulatory Visit (HOSPITAL_COMMUNITY): Payer: Self-pay

## 2024-04-02 DIAGNOSIS — Z1231 Encounter for screening mammogram for malignant neoplasm of breast: Secondary | ICD-10-CM

## 2024-04-08 ENCOUNTER — Other Ambulatory Visit: Payer: Self-pay | Admitting: Internal Medicine

## 2024-04-08 DIAGNOSIS — R928 Other abnormal and inconclusive findings on diagnostic imaging of breast: Secondary | ICD-10-CM

## 2024-04-09 ENCOUNTER — Encounter: Payer: Self-pay | Admitting: Internal Medicine

## 2024-04-10 ENCOUNTER — Encounter: Payer: Self-pay | Admitting: Internal Medicine

## 2024-04-10 ENCOUNTER — Ambulatory Visit
Admission: RE | Admit: 2024-04-10 | Discharge: 2024-04-10 | Disposition: A | Discharge status: Home / Self Care | Source: Ambulatory Visit | Attending: Internal Medicine | Admitting: Internal Medicine

## 2024-04-10 DIAGNOSIS — Z122 Encounter for screening for malignant neoplasm of respiratory organs: Secondary | ICD-10-CM

## 2024-04-21 ENCOUNTER — Ambulatory Visit
Admission: RE | Admit: 2024-04-21 | Discharge: 2024-04-21 | Disposition: A | Discharge status: Home / Self Care | Source: Ambulatory Visit | Attending: Internal Medicine | Admitting: Internal Medicine

## 2024-04-21 DIAGNOSIS — R928 Other abnormal and inconclusive findings on diagnostic imaging of breast: Secondary | ICD-10-CM

## 2024-04-30 ENCOUNTER — Encounter: Admitting: Student

## 2024-05-05 ENCOUNTER — Encounter: Admitting: Student

## 2024-05-08 ENCOUNTER — Encounter: Payer: Self-pay | Admitting: Internal Medicine

## 2024-05-08 ENCOUNTER — Ambulatory Visit (INDEPENDENT_AMBULATORY_CARE_PROVIDER_SITE_OTHER): Payer: Self-pay | Admitting: Internal Medicine

## 2024-05-08 ENCOUNTER — Other Ambulatory Visit: Payer: Self-pay

## 2024-05-08 VITALS — BP 118/61 | HR 81 | Temp 98.8°F | Ht 64.0 in | Wt 259.8 lb

## 2024-05-08 DIAGNOSIS — E119 Type 2 diabetes mellitus without complications: Secondary | ICD-10-CM | POA: Diagnosis not present

## 2024-05-08 DIAGNOSIS — I739 Peripheral vascular disease, unspecified: Secondary | ICD-10-CM | POA: Diagnosis not present

## 2024-05-08 DIAGNOSIS — R600 Localized edema: Secondary | ICD-10-CM

## 2024-05-08 DIAGNOSIS — F17210 Nicotine dependence, cigarettes, uncomplicated: Secondary | ICD-10-CM | POA: Diagnosis not present

## 2024-05-08 DIAGNOSIS — E66813 Obesity, class 3: Secondary | ICD-10-CM

## 2024-05-08 DIAGNOSIS — Z6841 Body Mass Index (BMI) 40.0 and over, adult: Secondary | ICD-10-CM

## 2024-05-08 DIAGNOSIS — Z7984 Long term (current) use of oral hypoglycemic drugs: Secondary | ICD-10-CM

## 2024-05-08 DIAGNOSIS — Z720 Tobacco use: Secondary | ICD-10-CM

## 2024-05-08 MED ORDER — BUPROPION HCL ER (XL) 150 MG PO TB24
150.0000 mg | ORAL_TABLET | Freq: Every day | ORAL | 3 refills | Status: DC
  Filled 2024-05-08: qty 90, 90d supply, fill #0

## 2024-05-08 MED ORDER — EMPAGLIFLOZIN 10 MG PO TABS
10.0000 mg | ORAL_TABLET | Freq: Every day | ORAL | 3 refills | Status: DC
Start: 2024-05-08 — End: 2024-06-24
  Filled 2024-05-08: qty 30, 30d supply, fill #0

## 2024-05-08 MED ORDER — DAPAGLIFLOZIN PROPANEDIOL 5 MG PO TABS
5.0000 mg | ORAL_TABLET | Freq: Every day | ORAL | 0 refills | Status: DC
  Filled 2024-05-08: qty 90, 90d supply, fill #0

## 2024-05-08 NOTE — Progress Notes (Signed)
 CC: DMII follow up  HPI:  [name] is a 58 y.o. with medical history of HLD, DMII, CVA, class III obesity, tobacco use disorder presenting to [facility] for DMII follow up. One month ago pt was started on oral GLP1. A1c was near goal at 7.0.   Please see problem-based list for further details, assessments, and plans.  Past Medical History:  Diagnosis Date   Abnormal uterine bleeding (AUB) 08/17/2022   Acute CVA (cerebrovascular accident) (HCC) 03/10/2016   Aneurysm, cerebral, nonruptured 03/10/2016   Asthma    Bradycardia    Headache    hx   Right sided cerebral infarction (HCC) 04/23/2016   Stroke (HCC)    Tachypnea    Tobacco abuse    Transient cerebral ischemia    UTI (lower urinary tract infection) 03/09/2016    Current Outpatient Medications (Endocrine & Metabolic):    empagliflozin (JARDIANCE) 10 MG TABS tablet, Take 1 tablet (10 mg total) by mouth daily before breakfast.   metFORMIN  (GLUCOPHAGE -XR) 500 MG 24 hr tablet, Take 2 tablets (1,000 mg total) by mouth daily with breakfast.  Current Outpatient Medications (Cardiovascular):    atorvastatin  (LIPITOR) 40 MG tablet, Take 1 tablet (40 mg total) by mouth daily.   Current Outpatient Medications (Analgesics):    aspirin  EC 81 MG tablet, Take 1 tablet (81 mg total) by mouth daily. Swallow whole.   Current Outpatient Medications (Other):    buPROPion (WELLBUTRIN XL) 150 MG 24 hr tablet, Take 1 tablet (150 mg total) by mouth daily.   Blood Glucose Monitoring Suppl (CONTOUR NEXT ONE) DEVI, Use as directed once daily.   glucose blood (CONTOUR NEXT TEST) test strip, Use to check blood sugar once daily.   Microlet Lancets MISC, Use to check blood sugar once daily.   varenicline  (CHANTIX ) 0.5 MG tablet, Take 1 tablet (0.5 mg total) by mouth 2 (two) times daily.  Review of Systems:  Review of system negative unless stated in the problem list or HPI.    Physical Exam:  Vitals:   05/08/24 1434 05/08/24 1529  BP: (!)  132/90 118/61  Pulse: 95 81  Temp: 98.8 F (37.1 C)   TempSrc: Oral   SpO2: 94%   Weight: 259 lb 12.8 oz (117.8 kg)   Height: 5\' 4"  (1.626 m)    Physical Exam General: NAD HENT: NCAT Lungs: CTAB, no wheeze, rhonchi or rales.  Cardiovascular: Normal heart sounds, no r/m/g, 2+ pulses in all extremities. No LE edema Abdomen: No TTP, normal bowel sounds MSK: No asymmetry or muscle atrophy.  Skin: no lesions noted on exposed skin Neuro: Alert and oriented x4. CN grossly intact Psych: Normal mood and normal affect   Assessment & Plan:   Tobacco abuse Patient with history of tobacco use disorder who states she did not like Chantix  and does not want to try nicotine  placement therapy.  Discussed using Wellbutrin for smoking cessation and patient is agreeable to this.  Will start Wellbutrin 150 mg daily.  This can be uptitrated to 150 mg twice daily if patient tolerates the initial dose well.    Type 2 diabetes mellitus, without long-term current use of insulin (HCC) Patient with diabetes who is on metformin  1000 mg daily.  She was started on semaglutide  tablets but states her insurance did not cover those.  Her A1c is 7.0.  Will start her on Jardiance 10 mg daily.  Bilateral lower extremity edema Patient with asymmetric bilateral lower extremity edema.  Right leg is larger than the  left leg. Right LE is 46 cm and the left is 42 cm. Attempt was made to get an image of this but it did not cross over to the media tab. Patient states this has been the case since her stroke in 2017.  This has been investigated by ultrasound and there was no DVT.  Patient has some tenderness palpation of the right calf but is not reproducible.  Celine Collard' sign is negative. No other current intervention needed at this time.  Morbid obesity (HCC) Patient with class III obesity.  She would have benefited from GLP-1 agonist but does not like any injectable medications.  Last visit oral semaglutide  was ordered but this was  not covered by her insurance.  We are starting her on Wellbutrin for smoking cessation which has some benefit in obesity.  Advised on lifestyle modifications including continued walking and limiting her carb intake.  Patient had a regular 7 up soft with her during her visit.  She also requested a handicap placard but advised against this as she has PAD and obesity and walking is very beneficial for her.  PAD (peripheral artery disease) (HCC) Patient with PAD who is on aspirin  and Lipitor 40 mg.  Denies any symptoms of claudication.  Advised regular moderate intensity exercise including walking.  Patient wanted a disability placard but advised against this as walking has been shown to be beneficial for PAD.   See Encounters Tab for problem based charting.  Patient Discussed with Dr. Starling Eck, MD Tommas Fragmin. [facility] Internal Medicine Residency, PGY-3

## 2024-05-08 NOTE — Patient Instructions (Signed)
 [name], it was a pleasure seeing you today! You endorsed feeling well today. Below are some of the things we talked about this visit. We look forward to seeing you in the follow up appointment!  Today we discussed: For your diabetes, I am starting a medicine called jardiance. Take one every day.  For your smoking, start wellbutrin. Take this medicine every day. Your lung scan show two very small nodules and we recommended repeating it every next year. Best thing is to stop smoking.   I have ordered the following labs today:  Lab Orders  No laboratory test(s) ordered today      Referrals ordered today:   Referral Orders  No referral(s) requested today     I have ordered the following medication/changed the following medications:   Stop the following medications: Medications Discontinued During This Encounter  Medication Reason   diphenhydrAMINE (BENADRYL) 50 MG capsule Discontinued by provider   nystatin  powder Discontinued by provider   Semaglutide  3 MG TABS Patient has not taken in last 30 days   dapagliflozin propanediol (FARXIGA) 5 MG TABS tablet Duplicate     Start the following medications: Meds ordered this encounter  Medications   buPROPion (WELLBUTRIN XL) 150 MG 24 hr tablet    Sig: Take 1 tablet (150 mg total) by mouth daily.    Dispense:  90 tablet    Refill:  3   DISCONTD: dapagliflozin propanediol (FARXIGA) 5 MG TABS tablet    Sig: Take 1 tablet (5 mg total) by mouth daily before breakfast.    Dispense:  90 tablet    Refill:  0   empagliflozin (JARDIANCE) 10 MG TABS tablet    Sig: Take 1 tablet (10 mg total) by mouth daily before breakfast.    Dispense:  90 tablet    Refill:  3     Follow-up: 2 month follow up   Please make sure to arrive 15 minutes prior to your next appointment. If you arrive late, you may be asked to reschedule.   We look forward to seeing you next time. Please call our clinic at [phone] if you have any questions or  concerns. The best time to call is Monday-Friday from 9am-4pm, but there is someone available 24/7. If after hours or the weekend, call the main hospital number and ask for the Internal Medicine Resident On-Call. If you need medication refills, please notify your pharmacy one week in advance and they will send us  a request.  Thank you for letting us  take part in your care. Wishing you the best!  Thank you, Jackolyn Masker, MD

## 2024-05-09 ENCOUNTER — Other Ambulatory Visit: Payer: Self-pay

## 2024-05-09 ENCOUNTER — Telehealth: Payer: Self-pay

## 2024-05-09 NOTE — Telephone Encounter (Signed)
 Maxima Skelton (Key: [id number]) Rx #: [id number] Need Help? Call us  at [phone] Outcome Additional Information Required Prior Authorization Not Required Drug Jardiance 10MG  tablets ePA cloud logo Form [facility] Medicaid of [place]  Electronic Prior Authorization Request Form [phone] [place]) Original Claim Info 6

## 2024-05-09 NOTE — Telephone Encounter (Signed)
 Prior Authorization for patient (Jardiance 10MG  tablets)  came through on cover my meds was submitted with last office notes and labs awaiting approval or denial.  ZOX:[id number]

## 2024-05-10 NOTE — Assessment & Plan Note (Signed)
 Patient with class III obesity.  She would have benefited from GLP-1 agonist but does not like any injectable medications.  Last visit oral semaglutide  was ordered but this was not covered by her insurance.  We are starting her on Wellbutrin for smoking cessation which has some benefit in obesity.  Advised on lifestyle modifications including continued walking and limiting her carb intake.  Patient had a regular 7 up soft with her during her visit.  She also requested a handicap placard but advised against this as she has PAD and obesity and walking is very beneficial for her.

## 2024-05-10 NOTE — Assessment & Plan Note (Signed)
 Patient with diabetes who is on metformin  1000 mg daily.  She was started on semaglutide  tablets but states her insurance did not cover those.  Her A1c is 7.0.  Will start her on Jardiance  10 mg daily.

## 2024-05-10 NOTE — Assessment & Plan Note (Signed)
 Patient with history of tobacco use disorder who states she did not like Chantix  and does not want to try nicotine  placement therapy.  Discussed using Wellbutrin for smoking cessation and patient is agreeable to this.  Will start Wellbutrin 150 mg daily.  This can be uptitrated to 150 mg twice daily if patient tolerates the initial dose well.

## 2024-05-10 NOTE — Assessment & Plan Note (Addendum)
 Patient with asymmetric bilateral lower extremity edema.  Right leg is larger than the left leg. Right LE is 46 cm and the left is 42 cm. Attempt was made to get an image of this but it did not cross over to the media tab. Patient states this has been the case since her stroke in 2017.  This has been investigated by ultrasound and there was no DVT.  Patient has some tenderness palpation of the right calf but is not reproducible.  Celine Collard' sign is negative. No other current intervention needed at this time.

## 2024-05-10 NOTE — Assessment & Plan Note (Addendum)
 Patient with PAD who is on aspirin  and Lipitor 40 mg.  Denies any symptoms of claudication.  Advised regular moderate intensity exercise including walking.  Patient wanted a disability placard but advised against this as walking has been shown to be beneficial for PAD.

## 2024-05-13 ENCOUNTER — Other Ambulatory Visit (HOSPITAL_COMMUNITY): Payer: Self-pay

## 2024-05-13 ENCOUNTER — Ambulatory Visit (AMBULATORY_SURGERY_CENTER)

## 2024-05-13 VITALS — Ht 64.0 in | Wt 256.0 lb

## 2024-05-13 DIAGNOSIS — Z1211 Encounter for screening for malignant neoplasm of colon: Secondary | ICD-10-CM

## 2024-05-13 MED ORDER — PEG 3350-KCL-NA BICARB-NACL 420 G PO SOLR
4000.0000 mL | Freq: Once | ORAL | 0 refills | Status: AC
  Filled 2024-05-13: qty 4000, 1d supply, fill #0

## 2024-05-13 MED ORDER — BISACODYL EC 5 MG PO TBEC
5.0000 mg | DELAYED_RELEASE_TABLET | ORAL | 0 refills | Status: AC
  Filled 2024-05-13: qty 4, 1d supply, fill #0

## 2024-05-13 NOTE — Progress Notes (Signed)
 No egg or soy allergy known to patient  No issues known to pt with past sedation with any surgeries or procedures Patient denies ever being told they had issues or difficulty with intubation  No FH of Malignant Hyperthermia Pt is not on diet pills Pt is not on  home 02  Pt is not on blood thinners  Pt denies issues with constipation  No A fib or A flutter Have any cardiac testing pending--no  LOA: independent  Prep: golytely   Patient's chart reviewed by Cathlyn Parsons CNRA prior to previsit and patient appropriate for the [facility].  Previsit completed and red dot placed by patient's name on their procedure day (on provider's schedule).     PV completed with patient. Prep instructions sent via mychart and home address.

## 2024-05-14 NOTE — Progress Notes (Signed)
 Internal Medicine Clinic Attending  Case discussed with the resident at the time of the visit.  We reviewed the resident's history and exam and pertinent patient test results.  I agree with the assessment, diagnosis, and plan of care documented in the resident's note.

## 2024-05-20 ENCOUNTER — Other Ambulatory Visit: Payer: Self-pay

## 2024-05-21 ENCOUNTER — Other Ambulatory Visit: Payer: Self-pay

## 2024-05-26 ENCOUNTER — Ambulatory Visit (AMBULATORY_SURGERY_CENTER): Admitting: Internal Medicine

## 2024-05-26 ENCOUNTER — Encounter: Payer: Self-pay | Admitting: Internal Medicine

## 2024-05-26 VITALS — BP 112/63 | HR 97 | Temp 97.9°F | Resp 20 | Ht 64.0 in | Wt 256.0 lb

## 2024-05-26 DIAGNOSIS — K635 Polyp of colon: Secondary | ICD-10-CM | POA: Diagnosis not present

## 2024-05-26 DIAGNOSIS — K648 Other hemorrhoids: Secondary | ICD-10-CM

## 2024-05-26 DIAGNOSIS — D124 Benign neoplasm of descending colon: Secondary | ICD-10-CM

## 2024-05-26 DIAGNOSIS — K5730 Diverticulosis of large intestine without perforation or abscess without bleeding: Secondary | ICD-10-CM | POA: Diagnosis not present

## 2024-05-26 DIAGNOSIS — Z1211 Encounter for screening for malignant neoplasm of colon: Secondary | ICD-10-CM | POA: Diagnosis not present

## 2024-05-26 MED ORDER — SODIUM CHLORIDE 0.9 % IV SOLN: 500.0000 mL | INTRAVENOUS | Status: DC

## 2024-05-26 NOTE — Progress Notes (Signed)
 GASTROENTEROLOGY PROCEDURE H&P NOTE   Primary Care Physician: Malen Scudder, DO    Reason for Procedure:   Colon cancer screening  Plan:    Colonoscopy  Patient is appropriate for endoscopic procedure(s) in the ambulatory ([facility]) setting.  The nature of the procedure, as well as the risks, benefits, and alternatives were carefully and thoroughly reviewed with the patient. Ample time for discussion and questions allowed. The patient understood, was satisfied, and agreed to proceed.     HPI: Erika Wong is a 58 y.o. female who presents for colonoscopy for colon cancer screening. Denies blood in stools, changes in bowel habits, or unintentional weight loss. She cannot remember if one of her family members had colon cancer. Per her family history list, it states that her brother had colon cancer at age 32. This is her first colonoscopy.  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 at [facility]    CRANIOTOMY Left 06/09/2016   Procedure: Craniotomy - left pterional for clipping of aneurysm;  Surgeon: Augusto Blonder, MD;  Location: [facility];  Service: Neurosurgery;  Laterality: Left;   IR GENERIC HISTORICAL  04/20/2016   IR ANGIO INTRA EXTRACRAN SEL INTERNAL CAROTID BILAT MOD SED 04/20/2016 Augusto Blonder, MD [facility]   IR GENERIC HISTORICAL  04/20/2016   IR ANGIO VERTEBRAL SEL VERTEBRAL BILAT MOD SED 04/20/2016 Augusto Blonder, MD [facility]   IR GENERIC HISTORICAL   04/20/2016   IR 3D INDEPENDENT WKST 04/20/2016 Augusto Blonder, MD [facility]   TEE WITHOUT CARDIOVERSION N/A 03/29/2016   Procedure: TRANSESOPHAGEAL ECHOCARDIOGRAM (TEE);  Surgeon: Lake Pilgrim, MD;  Location: [facility] ENDOSCOPY;  Service: Cardiovascular;  Laterality: N/A;   TUBAL LIGATION  87    Prior to Admission medications   Medication Sig Start Date End Date Taking? Authorizing Provider  aspirin  EC 81 MG tablet Take 1 tablet (81 mg total) by mouth daily. Swallow whole. 07/16/23 07/15/24  Malen Scudder, DO  atorvastatin  (LIPITOR) 40 MG tablet Take 1 tablet (40 mg total) by mouth daily. 03/19/24 03/19/25  Carleen Chary, DO  bisacodyl  5 MG EC tablet Take 1 tablet (5 mg total) by mouth as directed on your colonoscopy prep instructions. 05/13/24   Daina Drum, MD  Blood Glucose Monitoring Suppl (CONTOUR NEXT ONE) DEVI Use as directed once daily. 09/01/22   True Fuss, MD  buPROPion  (WELLBUTRIN  XL) 150 MG 24 hr tablet Take 1 tablet (150 mg total) by mouth daily. Patient not taking: Reported on 05/13/2024 05/08/24   Jackolyn Masker, MD  empagliflozin  (JARDIANCE ) 10 MG TABS tablet Take 1 tablet (10 mg total) by mouth daily before breakfast. 05/08/24 05/08/25  Jackolyn Masker, MD  glucose blood (CONTOUR NEXT TEST) test strip Use to check blood sugar once daily. 09/01/22   True Fuss, MD  metFORMIN  (GLUCOPHAGE -XR) 500 MG 24 hr tablet Take 2 tablets (1,000 mg total) by mouth daily with breakfast. 10/10/23  Jayson Michael, MD  Microlet Lancets MISC Use to check blood sugar once daily. 09/01/22   True Fuss, MD  varenicline  (CHANTIX ) 0.5 MG tablet Take 1 tablet (0.5 mg total) by mouth 2 (two) times daily. Patient not taking: Reported on 05/13/2024 10/10/23   Jayson Michael, MD    Current Outpatient Medications  Medication Sig Dispense Refill   aspirin  EC 81 MG tablet Take 1 tablet (81 mg total) by mouth daily. Swallow whole. 150 tablet 2   atorvastatin  (LIPITOR) 40 MG tablet Take 1 tablet (40 mg total)  by mouth daily. 30 tablet 11   bisacodyl  5 MG EC tablet Take 1 tablet (5 mg total) by mouth as directed on your colonoscopy prep instructions. 4 tablet 0   Blood Glucose Monitoring Suppl (CONTOUR NEXT ONE) DEVI Use as directed once daily. 1 each 0   empagliflozin  (JARDIANCE ) 10 MG TABS tablet Take 1 tablet (10 mg total) by mouth daily before breakfast. 90 tablet 3   glucose blood (CONTOUR NEXT TEST) test strip Use to check blood sugar once daily. 100 each 12   metFORMIN  (GLUCOPHAGE -XR) 500 MG 24 hr tablet Take 2 tablets (1,000 mg total) by mouth daily with breakfast. 60 tablet 3   Microlet Lancets MISC Use to check blood sugar once daily. 100 each 0   Current Facility-Administered Medications  Medication Dose Route Frequency Provider Last Rate Last Admin   0.9 %  sodium chloride  infusion  500 mL Intravenous Continuous Daina Drum, MD        Allergies as of 05/26/2024 - Review Complete 05/26/2024  Allergen Reaction Noted   Adhesive [tape] Rash 06/08/2016   Codeine Other (See Comments) 05/31/2016   Hydrocodone  Other (See Comments) 02/21/2012    Family History  Problem Relation Age of Onset   Congestive Heart Failure Mother        Died age 26   Diabetes Mother    Asthma Father    COPD Father    Asthma Sister    Diabetes Sister    Hypertension Sister    Colon cancer Brother 8   Asthma Brother    Prostate cancer Brother    Heart attack Paternal Grandmother 18   Breast cancer Neg Hx    Rectal cancer Neg Hx    Stomach cancer Neg Hx    Heart disease Neg Hx    Esophageal cancer Neg Hx     Social History   Socioeconomic History   Marital status: Married    Spouse name: Not on file   Number of children: 3   Years of education: Not on file   Highest education level: Not on file  Occupational History   Occupation: Works at afterschool care  Tobacco Use   Smoking status: Every Day    Current packs/day: 1.00    Average packs/day: 1 pack/day for 41.0 years (41.0 ttl pk-yrs)     Types: Cigarettes   Smokeless tobacco: Never  Vaping Use   Vaping status: Never Used  Substance and Sexual Activity   Alcohol use: Yes   Drug use: Yes    Frequency: 7.0 times per week    Types: Marijuana   Sexual activity: Not on file  Other Topics Concern   Not on file  Social History Narrative   17 grandchildren.  Lives with father.    Social Drivers of Corporate investment banker Strain: Not on file  Food Insecurity: Not on file  Transportation Needs: Not on file  Physical Activity: Not  on file  Stress: Not on file  Social Connections: Not on file  Intimate Partner Violence: Not on file    Physical Exam: Vital signs in last 24 hours: BP (!) 144/90   Pulse 80   Temp 97.9 F (36.6 C) (Temporal)   Ht 5\' 4"  (1.626 m)   Wt 256 lb (116.1 kg)   LMP 02/01/2016 Comment: 2 daya  SpO2 95%   BMI 43.94 kg/m  GEN: NAD EYE: Sclerae anicteric ENT: MMM CV: Non-tachycardic Pulm: No increased work of breathing GI: Soft, NT/ND NEURO:  Alert & Oriented   Regino Caprio, MD [facility]  05/26/2024 10:53 AM

## 2024-05-26 NOTE — Patient Instructions (Signed)
 Await pathology  Please read over handouts provided Continue your normal medications   YOU HAD AN ENDOSCOPIC PROCEDURE TODAY AT THE [facility]:   Refer to the procedure report that was given to you for any specific questions about what was found during the examination.  If the procedure report does not answer your questions, please call your gastroenterologist to clarify.  If you requested that your care partner not be given the details of your procedure findings, then the procedure report has been included in a sealed envelope for you to review at your convenience later.  YOU SHOULD EXPECT: Some feelings of bloating in the abdomen. Passage of more gas than usual.  Walking can help get rid of the air that was put into your GI tract during the procedure and reduce the bloating. If you had a lower endoscopy (such as a colonoscopy or flexible sigmoidoscopy) you may notice spotting of blood in your stool or on the toilet paper. If you underwent a bowel prep for your procedure, you may not have a normal bowel movement for a few days.  Please Note:  You might notice some irritation and congestion in your nose or some drainage.  This is from the oxygen used during your procedure.  There is no need for concern and it should clear up in a day or so.  SYMPTOMS TO REPORT IMMEDIATELY:  Following lower endoscopy (colonoscopy or flexible sigmoidoscopy):  Excessive amounts of blood in the stool  Significant tenderness or worsening of abdominal pains  Swelling of the abdomen that is new, acute  Fever of 100F or higher For urgent or emergent issues, a gastroenterologist can be reached at any hour by calling [phone]. Do not use MyChart messaging for urgent concerns.    DIET:  We do recommend a small meal at first, but then you may proceed to your regular diet.  Drink plenty of fluids but you should avoid alcoholic beverages for 24 hours.  ACTIVITY:  You should plan to take it easy for  the rest of today and you should NOT DRIVE or use heavy machinery until tomorrow (because of the sedation medicines used during the test).    FOLLOW UP: Our staff will call the number listed on your records the next business day following your procedure.  We will call around 7:15- 8:00 am to check on you and address any questions or concerns that you may have regarding the information given to you following your procedure. If we do not reach you, we will leave a message.     If any biopsies were taken you will be contacted by phone or by letter within the next 1-3 weeks.  Please call us at [phone] if you have not heard about the biopsies in 3 weeks.    SIGNATURES/CONFIDENTIALITY: You and/or your care partner have signed paperwork which will be entered into your electronic medical record.  These signatures attest to the fact that that the information above on your After Visit Summary has been reviewed and is understood.  Full responsibility of the confidentiality of this discharge information lies with you and/or your care-partner.

## 2024-05-26 NOTE — Op Note (Signed)
 [facility] Patient Name: Erika Wong Procedure Date: 05/26/2024 10:51 AM MRN: [id number] Endoscopist: Pedro Bourgeois , , [id number] Age: 59 Referring MD:  Date of Birth: 06-Dec-1966 Gender: Female Account #: [id number] Procedure:                Colonoscopy Indications:              Screening for colorectal malignant neoplasm, This                            is the patient's first colonoscopy Medicines:                Monitored Anesthesia Care Procedure:                Pre-Anesthesia Assessment:                           - Prior to the procedure, a History and Physical                            was performed, and patient medications and                            allergies were reviewed. The patient's tolerance of                            previous anesthesia was also reviewed. The risks                            and benefits of the procedure and the sedation                            options and risks were discussed with the patient.                            All questions were answered, and informed consent                            was obtained. Prior Anticoagulants: The patient has                            taken no anticoagulant or antiplatelet agents. ASA                            Grade Assessment: III - A patient with severe                            systemic disease. After reviewing the risks and                            benefits, the patient was deemed in satisfactory                            condition to undergo the procedure.  After obtaining informed consent, the colonoscope                            was passed under direct vision. Throughout the                            procedure, the patient's blood pressure, pulse, and                            oxygen saturations were monitored continuously. The                            Colonoscope was introduced through the anus and                            advanced to the  the terminal ileum. The colonoscopy                            was performed without difficulty. The patient                            tolerated the procedure well. The quality of the                            bowel preparation was good. The terminal ileum,                            ileocecal valve, appendiceal orifice, and rectum                            were photographed. Scope In: 11:01:18 AM Scope Out: 11:23:11 AM Scope Withdrawal Time: 0 hours 18 minutes 9 seconds  Total Procedure Duration: 0 hours 21 minutes 53 seconds  Findings:                 The terminal ileum appeared normal.                           A 4 mm polyp was found in the descending colon. The                            polyp was sessile. The polyp was removed with a                            cold snare. Resection and retrieval were complete.                           Multiple diverticula were found in the sigmoid                            colon and descending colon.                           Non-bleeding internal hemorrhoids were found during  retroflexion. Complications:            No immediate complications. Estimated Blood Loss:     Estimated blood loss was minimal. Impression:               - The examined portion of the ileum was normal.                           - One 4 mm polyp in the descending colon, removed                            with a cold snare. Resected and retrieved.                           - Diverticulosis in the sigmoid colon and in the                            descending colon.                           - Non-bleeding internal hemorrhoids. Recommendation:           - Discharge patient to home (with escort).                           - Await pathology results.                           - The findings and recommendations were discussed                            with the patient. Dr Pedro Bourgeois "[name]" [name],  05/26/2024 11:26:16 AM

## 2024-05-26 NOTE — Progress Notes (Signed)
 Called to room to assist during endoscopic procedure.  Patient ID and intended procedure confirmed with present staff. Received instructions for my participation in the procedure from the performing physician.

## 2024-05-26 NOTE — Progress Notes (Signed)
 Sedate, gd SR, tolerated procedure well, VSS, report to RN

## 2024-05-27 ENCOUNTER — Telehealth: Payer: Self-pay

## 2024-05-27 NOTE — Telephone Encounter (Signed)
 Left message

## 2024-05-28 ENCOUNTER — Ambulatory Visit: Payer: Self-pay | Admitting: Internal Medicine

## 2024-05-28 LAB — SURGICAL PATHOLOGY

## 2024-06-16 ENCOUNTER — Other Ambulatory Visit: Payer: Self-pay

## 2024-06-16 ENCOUNTER — Other Ambulatory Visit (HOSPITAL_COMMUNITY): Payer: Self-pay

## 2024-06-16 DIAGNOSIS — E1165 Type 2 diabetes mellitus with hyperglycemia: Secondary | ICD-10-CM

## 2024-06-16 MED ORDER — METFORMIN HCL ER 500 MG PO TB24
1000.0000 mg | ORAL_TABLET | Freq: Every day | ORAL | 3 refills | Status: AC
  Filled 2024-06-16: qty 60, 30d supply, fill #0
  Filled 2024-08-04 – 2024-08-25 (×2): qty 60, 30d supply, fill #1
  Filled 2024-11-04 – 2024-11-14 (×2): qty 60, 30d supply, fill #2
  Filled 2025-01-05: qty 60, 30d supply, fill #3

## 2024-06-16 NOTE — Telephone Encounter (Unsigned)
 Copied from CRM [id number]. Topic: Clinical - Medication Refill >> Jun 16, 2024 12:49 PM Carrielelia G wrote: Medication: MetFORMIN  (GLUCOPHAGE -XR) 500 MG 24 hr tablet  Has the patient contacted their pharmacy? No (Agent: If no, request that the patient contact the pharmacy for the refill. If patient does not wish to contact the pharmacy document the reason why and proceed with request.) (Agent: If yes, when and what did the pharmacy advise?)  This is the patient's preferred pharmacy:  [facility] [address], [address] [place] [address] Phone: [phone] Fax: [phone]  Is this the correct pharmacy for this prescription? Yes If no, delete pharmacy and type the correct one.   Has the prescription been filled recently? Yes  Is the patient out of the medication? No  Has the patient been seen for an appointment in the last year OR does the patient have an upcoming appointment? Yes  Can we respond through MyChart? No  Agent: Please be advised that Rx refills may take up to 3 business days. We ask that you follow-up with your pharmacy.

## 2024-06-19 ENCOUNTER — Encounter: Admitting: Student

## 2024-06-24 ENCOUNTER — Encounter: Payer: Self-pay | Admitting: Student

## 2024-06-24 ENCOUNTER — Ambulatory Visit (INDEPENDENT_AMBULATORY_CARE_PROVIDER_SITE_OTHER): Admitting: Student

## 2024-06-24 ENCOUNTER — Other Ambulatory Visit: Payer: Self-pay

## 2024-06-24 ENCOUNTER — Telehealth: Payer: Self-pay

## 2024-06-24 ENCOUNTER — Other Ambulatory Visit (HOSPITAL_COMMUNITY): Payer: Self-pay

## 2024-06-24 VITALS — BP 126/63 | HR 88 | Temp 98.1°F | Ht 64.0 in | Wt 260.2 lb

## 2024-06-24 DIAGNOSIS — Z7984 Long term (current) use of oral hypoglycemic drugs: Secondary | ICD-10-CM | POA: Diagnosis not present

## 2024-06-24 DIAGNOSIS — E119 Type 2 diabetes mellitus without complications: Secondary | ICD-10-CM

## 2024-06-24 DIAGNOSIS — Z720 Tobacco use: Secondary | ICD-10-CM

## 2024-06-24 DIAGNOSIS — Z7985 Long-term (current) use of injectable non-insulin antidiabetic drugs: Secondary | ICD-10-CM | POA: Diagnosis not present

## 2024-06-24 DIAGNOSIS — F17210 Nicotine dependence, cigarettes, uncomplicated: Secondary | ICD-10-CM

## 2024-06-24 LAB — POCT GLYCOSYLATED HEMOGLOBIN (HGB A1C): Hemoglobin A1C: 6.9 % — AB (ref 4.0–5.6)

## 2024-06-24 LAB — GLUCOSE, CAPILLARY: Glucose-Capillary: 127 mg/dL — ABNORMAL HIGH (ref 70–99)

## 2024-06-24 MED ORDER — SEMAGLUTIDE(0.25 OR 0.5MG/DOS) 2 MG/3ML ~~LOC~~ SOPN
0.2500 mg | PEN_INJECTOR | SUBCUTANEOUS | 3 refills | Status: AC
  Filled 2024-06-24: qty 3, 28d supply, fill #0

## 2024-06-24 MED ORDER — EMPAGLIFLOZIN 10 MG PO TABS
10.0000 mg | ORAL_TABLET | Freq: Every day | ORAL | 3 refills | Status: AC
  Filled 2024-06-24: qty 90, 90d supply, fill #0

## 2024-06-24 NOTE — Telephone Encounter (Signed)
 [employer] (KeyBETHA MOTLEY) Rx #: [id number] Need Help? Call us  at [phone] Outcome Additional Information Required Prior Authorization Not Required Drug Jardiance  10MG  tablets ePA cloud logo Form [facility] Medicaid of [place]  Electronic Prior Authorization Request Form [phone] NCPDP) Original Claim Info 36

## 2024-06-24 NOTE — Progress Notes (Signed)
 CC: T2DM follow up  HPI:  Erika Wong is a 58 y.o. female living with a history stated below and presents today for follow up regarding her diabetes. Please see problem based assessment and plan for additional details.  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 Swallow whole. 150 tablet 2   atorvastatin  (LIPITOR) 40 MG tablet Take 1 tablet (40 mg total) by mouth daily. 30 tablet 11   bisacodyl  5 MG EC tablet Take 1 tablet (5 mg total) by mouth as directed on your colonoscopy prep instructions. 4 tablet 0   Blood Glucose Monitoring Suppl (CONTOUR NEXT ONE) DEVI Use as directed once daily. 1 each 0   glucose blood (CONTOUR NEXT TEST) test strip Use to check blood sugar once daily. 100 each 12   metFORMIN  (GLUCOPHAGE -XR) 500 MG 24 hr tablet Take 2 tablets (1,000 mg total) by mouth daily with breakfast. 60 tablet 3   Microlet Lancets MISC Use to check blood sugar once daily. 100 each 0   No current facility-administered medications on file prior to visit.    Family History  Problem Relation Age of Onset   Congestive Heart Failure Mother        Died age 31   Diabetes Mother    Asthma Father    COPD Father    Asthma Sister    Diabetes Sister    Hypertension Sister    Colon cancer Brother 33   Asthma Brother    Prostate cancer Brother    Heart attack Paternal Grandmother 71   Breast cancer Neg Hx     Rectal cancer Neg Hx    Stomach cancer Neg Hx    Heart disease Neg Hx    Esophageal cancer Neg Hx     Social History   Socioeconomic History   Marital status: Married    Spouse name: Not on file   Number of children: 3   Years of education: Not on file   Highest education level: Not on file  Occupational History   Occupation: Works at afterschool care  Tobacco Use   Smoking status: Every Day    Current packs/day: 1.00    Average packs/day: 1 pack/day for 41.0 years (41.0 ttl pk-yrs)    Types: Cigarettes   Smokeless tobacco: Never  Vaping Use   Vaping status: Never Used  Substance and Sexual Activity   Alcohol use: Yes   Drug use: Yes    Frequency: 7.0 times per week    Types: Marijuana   Sexual activity: Not on file  Other Topics Concern   Not on file  Social History Narrative   17 grandchildren.  Lives with father.    Social Drivers of Corporate investment banker Strain: Not on file  Food Insecurity: Not on file  Transportation Needs: Not on file  Physical Activity: Not on file  Stress: Not on file  Social Connections: Not on file  Intimate Partner Violence: Not on file    Review of Systems: ROS negative except for what is noted on the assessment and plan.  Vitals:   06/24/24 1054  BP: 126/63  Pulse: 88  Temp: 98.1 F (36.7 C)  TempSrc: Oral  SpO2: 98%  Weight: 260 lb 3.2 oz (118 kg)  Height: 5' 4 (1.626 m)    Physical Exam: Constitutional: well-appearing female  in no acute distress Cardiovascular: regular rate and rhythm, no m/r/g Pulmonary/Chest: normal work of breathing on room air, lungs clear to auscultation bilaterally Abdominal: soft, non-tender, non-distended  Assessment & Plan:   Type 2 diabetes mellitus, without long-term current use of insulin (HCC) A1c relatively the same today at 6.9, compared to 7.0 in April of this year. Her only regimen is metformin  1000mg  daily, which she states that she is taking. She is attempting to eat  better, but unfortunately hasn't had the time to exercise. Her Jardiance  was very expensive when sent to the pharmacy and she wasn't able to obtain them. Semaglutide  tablets were also trialed but insurance didn't cover them.   I think she would benefit greatly from the addition of Jardiance  to her regimen, as well as Ozempic  given the glycemic and weight loss affects this offers. So will send both of these in for her.   Plan:  - Continue Metformin  1000mg  daily  - Restart Ozempic  .25mg   - Restart Jardiance  10mg   Tobacco abuse She has tried multiple first line therapies for tobacco use, including chantix , nicotine  patches, gum, and also Wellbutrin . None of these have worked for her. She is currently smoking about half a pack a day. We have set a goal, to go down to 5 cigarrettes a day by the end of August, which she is agreeable to try.   I spent 9 minutes discussing this with her.    Patient discussed with Dr. Machen  Ilija Maxim, M.D. [facility], PGY-3 Pager: [phone] Date 06/24/2024 Time 1:36 PM

## 2024-06-24 NOTE — Assessment & Plan Note (Signed)
 She has tried multiple first line therapies for tobacco use, including chantix , nicotine  patches, gum, and also Wellbutrin . None of these have worked for her. She is currently smoking about half a pack a day. We have set a goal, to go down to 5 cigarrettes a day by the end of August, which she is agreeable to try.   I spent 9 minutes discussing this with her.

## 2024-06-24 NOTE — Telephone Encounter (Signed)
 Erika Wong (Key: [facility]) Rx #: [id number] Ozempic  (0.25 or 0.5 MG/DOSE) 2MG JONELLE pen-injectors Form [employer] [facility] Electronic Prior Authorization Form 2017 NCPDP Created Sent to Plan Plan Response Submit Clinical Questions Determination Favorable Message from Plan Approved. This drug has been approved. Approved quantity: 3 units per 28 day(s). The drug has been approved from 06/24/2024 to 06/24/2025. Please call the pharmacy to process your prescription claim. Generic or biosimilar substitution may be required when available and preferred on the formulary.. Authorization Expiration Date: June 24, 2025

## 2024-06-24 NOTE — Telephone Encounter (Signed)
 Prior Authorization for patient (Jardiance  10MG  tablets) came through on cover my meds was submitted with last office notes and labs awaiting approval or denial.  [id number]

## 2024-06-24 NOTE — Patient Instructions (Addendum)
 Thank you so much for coming to the clinic today!   Im glad your doing well! We'll continue the metformin . I will let you know the response I get from our pharmacist here. Remember our goal to go down to 5 cigarettes by the end of August!  If you have any questions please feel free to the call the clinic at anytime at [phone]. It was a pleasure seeing you!  Best, Dr. Grant Henkes

## 2024-06-24 NOTE — Telephone Encounter (Signed)
 Prior Authorization for patient (Ozempic  (0.25 or 0.5 MG/DOSE) 2MG /3ML pen-injectors) came through on cover my meds was submitted with last office notes and labs awaiting approval or denial.  XZB:[id number]

## 2024-06-24 NOTE — Assessment & Plan Note (Addendum)
 A1c relatively the same today at 6.9, compared to 7.0 in April of this year. Her only regimen is metformin  1000mg  daily, which she states that she is taking. She is attempting to eat better, but unfortunately hasn't had the time to exercise. Her Jardiance  was very expensive when sent to the pharmacy and she wasn't able to obtain them. Semaglutide  tablets were also trialed but insurance didn't cover them.   I think she would benefit greatly from the addition of Jardiance  to her regimen, as well as Ozempic  given the glycemic and weight loss affects this offers. So will send both of these in for her.   Plan:  - Continue Metformin  1000mg  daily  - Restart Ozempic  .25mg   - Restart Jardiance  10mg 

## 2024-07-01 NOTE — Telephone Encounter (Signed)
 [employer] (KeyBETHA PARODY) PA Case ID #: [id number] Need Help? Call us  at [phone] Outcome Approved today by [facility] Medicaid 2017 Approved. This drug has been approved. Approved quantity: 30 units per 30 day(s). The drug has been approved from 06/17/2024 to 07/01/2025. Please call the pharmacy to process your prescription claim. Generic or biosimilar substitution may be required when available and preferred on the formulary. Effective Date: 06/17/2024 Authorization Expiration Date: 07/01/2025 Drug Jardiance  10MG  tablets ePA cloud logo Form [facility] Medicaid of   Electronic Prior Authorization Request Form [phone] NCPDP)

## 2024-08-04 ENCOUNTER — Other Ambulatory Visit: Payer: Self-pay

## 2024-08-04 ENCOUNTER — Other Ambulatory Visit (HOSPITAL_COMMUNITY): Payer: Self-pay

## 2024-08-11 ENCOUNTER — Other Ambulatory Visit (HOSPITAL_COMMUNITY): Payer: Self-pay

## 2024-08-11 MED ORDER — ASPIRIN 81 MG PO TBEC
81.0000 mg | DELAYED_RELEASE_TABLET | Freq: Every day | ORAL | 2 refills | Status: AC
  Filled 2024-08-11 – 2024-08-25 (×2): qty 90, 90d supply, fill #0
  Filled 2024-11-04 – 2024-11-14 (×3): qty 90, 90d supply, fill #1

## 2024-08-15 ENCOUNTER — Other Ambulatory Visit (HOSPITAL_COMMUNITY): Payer: Self-pay

## 2024-08-26 ENCOUNTER — Other Ambulatory Visit (HOSPITAL_COMMUNITY): Payer: Self-pay

## 2024-10-15 ENCOUNTER — Telehealth: Payer: Self-pay

## 2024-10-15 NOTE — Telephone Encounter (Signed)
 Rtn call to pt .  LMOM for the pt call back or to contact her Insurance with [facility] Medicaid in reference to setting up transportation arrangements to and form her ov appts.  Copied from CRM [id number]. Topic: General - Transportation >> Oct 14, 2024  4:17 PM Alfonso ORN wrote: Reason for CRM: spoke to someone yesterday to make an appt. and stated someone suppose to reach out to her about getting  transportation to and from her appt.   Per CAL will be reaching out to the manager to see what can be done far as transportation and will contact patient in the morning

## 2024-10-16 ENCOUNTER — Ambulatory Visit: Payer: Self-pay

## 2024-11-04 ENCOUNTER — Other Ambulatory Visit: Payer: Self-pay

## 2024-11-04 ENCOUNTER — Other Ambulatory Visit (HOSPITAL_COMMUNITY): Payer: Self-pay

## 2024-11-14 ENCOUNTER — Other Ambulatory Visit (HOSPITAL_COMMUNITY): Payer: Self-pay

## 2024-11-24 ENCOUNTER — Ambulatory Visit: Payer: Self-pay | Admitting: Student

## 2024-12-25 ENCOUNTER — Other Ambulatory Visit (HOSPITAL_COMMUNITY): Payer: Self-pay

## 2024-12-25 ENCOUNTER — Encounter: Payer: Self-pay | Admitting: Emergency Medicine

## 2024-12-25 ENCOUNTER — Ambulatory Visit
Admission: EM | Admit: 2024-12-25 | Discharge: 2024-12-25 | Disposition: A | Discharge status: Home / Self Care | Attending: Internal Medicine | Admitting: Internal Medicine

## 2024-12-25 ENCOUNTER — Ambulatory Visit (INDEPENDENT_AMBULATORY_CARE_PROVIDER_SITE_OTHER)

## 2024-12-25 DIAGNOSIS — M5442 Lumbago with sciatica, left side: Secondary | ICD-10-CM | POA: Diagnosis present

## 2024-12-25 DIAGNOSIS — R350 Frequency of micturition: Secondary | ICD-10-CM | POA: Diagnosis present

## 2024-12-25 LAB — POCT URINE DIPSTICK
Bilirubin, UA: NEGATIVE
Blood, UA: NEGATIVE
Glucose, UA: NEGATIVE mg/dL
Ketones, POC UA: NEGATIVE mg/dL
Nitrite, UA: NEGATIVE
POC PROTEIN,UA: NEGATIVE
Spec Grav, UA: 1.025
Urobilinogen, UA: 1 U/dL
pH, UA: 6

## 2024-12-25 MED ORDER — PREDNISONE 20 MG PO TABS
40.0000 mg | ORAL_TABLET | Freq: Every day | ORAL | 0 refills | Status: AC
  Filled 2024-12-25: qty 6, 3d supply, fill #0

## 2024-12-25 MED ORDER — KETOROLAC TROMETHAMINE 30 MG/ML IJ SOLN
30.0000 mg | Freq: Once | INTRAMUSCULAR | Status: AC
  Administered 2024-12-25: 30 mg via INTRAMUSCULAR

## 2024-12-25 MED ORDER — CYCLOBENZAPRINE HCL 5 MG PO TABS
5.0000 mg | ORAL_TABLET | Freq: Three times a day (TID) | ORAL | 0 refills | Status: AC | PRN
  Filled 2024-12-25: qty 20, 7d supply, fill #0

## 2024-12-25 NOTE — ED Provider Notes (Signed)
 " [facility]    CSN: [id number] Arrival date & time: 12/25/24  1546      History   Chief Complaint Chief Complaint  Patient presents with   Back Pain   Urinary Frequency    HPI Erika Wong is a 59 y.o. female.   59 year old female who presents to urgent care with complaints of lower back pain.  She reports this started about 7 days ago.  The pain has been fairly severe and making it difficult for her to get up and lay down.  It especially bad when she has been lying or sitting for long..  The pain does not radiate into her buttocks.  She denies any numbness or tingling.  She denies any dysuria or hematuria although she does relate she has been urinating more than usual.  She denies any fevers, abdominal pain, injury to the back.  She has had back problems in the past.   Back Pain Associated symptoms: no abdominal pain, no chest pain, no dysuria and no fever     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   Stroke (HCC)    Tachypnea    Tobacco abuse    Transient cerebral ischemia    UTI (lower urinary tract infection) 03/09/2016    Patient Active Problem List   Diagnosis Date Noted   Goiter 10/10/2023   PAD (peripheral artery disease) 03/21/2023   Type 2 diabetes mellitus, without long-term current use of insulin (HCC) 08/16/2022   Bilateral lower extremity edema 03/29/2022   Cerebral aneurysm, nonruptured 06/09/2016   History of CVA (cerebrovascular accident) 03/27/2016   Morbid obesity (HCC) 03/10/2016   Tobacco abuse     Past Surgical History:  Procedure Laterality Date   BRAIN SURGERY     june 23rd. at [facility]    CRANIOTOMY Left 06/09/2016   Procedure:  Craniotomy - left pterional for clipping of aneurysm;  Surgeon: Gerldine Maizes, MD;  Location: [facility];  Service: Neurosurgery;  Laterality: Left;   IR GENERIC HISTORICAL  04/20/2016   IR ANGIO INTRA EXTRACRAN SEL INTERNAL CAROTID BILAT MOD SED 04/20/2016 Gerldine Maizes, MD [facility]   IR GENERIC HISTORICAL  04/20/2016   IR ANGIO VERTEBRAL SEL VERTEBRAL BILAT MOD SED 04/20/2016 Gerldine Maizes, MD [facility]   IR GENERIC HISTORICAL  04/20/2016   IR 3D INDEPENDENT WKST 04/20/2016 Gerldine Maizes, MD [facility]   TEE WITHOUT CARDIOVERSION N/A 03/29/2016   Procedure: TRANSESOPHAGEAL ECHOCARDIOGRAM (TEE);  Surgeon: Aleene JINNY Passe, MD;  Location: [facility] ENDOSCOPY;  Service: Cardiovascular;  Laterality: N/A;   TUBAL LIGATION  87    OB History   No obstetric history on file.      Home Medications    Prior to Admission medications  Medication Sig Start Date End Date Taking? Authorizing Provider  aspirin  EC (ASPIRIN  LOW DOSE) 81 MG tablet Take 1 tablet (81 mg total) by mouth daily. Swallow whole. 08/11/24 08/11/25  Koomson, Julius, MD  atorvastatin  (LIPITOR) 40 MG tablet Take 1 tablet (40 mg total) by mouth daily. 03/19/24 03/19/25  Harrie Bruckner, DO  bisacodyl  5 MG EC tablet Take 1 tablet (5 mg total) by mouth as directed on your  colonoscopy prep instructions. 05/13/24   Federico Rosario BROCKS, MD  Blood Glucose Monitoring Suppl (CONTOUR NEXT ONE) DEVI Use as directed once daily. 09/01/22   Teresa Carrier, MD  empagliflozin  (JARDIANCE ) 10 MG TABS tablet Take 1 tablet (10 mg total) by mouth daily before breakfast. 06/24/24 06/24/25  Nooruddin, Saad, MD  glucose blood (CONTOUR NEXT TEST) test strip Use to check blood sugar once daily. 09/01/22   Teresa Carrier, MD  metFORMIN  (GLUCOPHAGE -XR) 500 MG 24 hr tablet Take 2 tablets (1,000 mg total) by mouth daily with breakfast. 06/16/24   Charmayne Holmes, DO  Microlet Lancets MISC Use to check blood sugar once daily. 09/01/22   Teresa Carrier, MD   Semaglutide ,0.25 or 0.5MG /DOS, 2 MG/3ML SOPN Inject 0.25 mg into the skin once a week. 06/24/24   Nooruddin, Saad, MD    Family History Family History  Problem Relation Age of Onset   Congestive Heart Failure Mother        Died age 68   Diabetes Mother    Asthma Father    COPD Father    Asthma Sister    Diabetes Sister    Hypertension Sister    Colon cancer Brother 58   Asthma Brother    Prostate cancer Brother    Heart attack Paternal Grandmother 63   Breast cancer Neg Hx    Rectal cancer Neg Hx    Stomach cancer Neg Hx    Heart disease Neg Hx    Esophageal cancer Neg Hx     Social History Social History[1]   Allergies   Adhesive [tape], Codeine, and Hydrocodone    Review of Systems Review of Systems  Constitutional:  Negative for chills and fever.  HENT:  Negative for ear pain and sore throat.   Eyes:  Negative for pain and visual disturbance.  Respiratory:  Negative for cough and shortness of breath.   Cardiovascular:  Negative for chest pain and palpitations.  Gastrointestinal:  Negative for abdominal pain and vomiting.  Genitourinary:  Positive for frequency. Negative for dysuria and hematuria.  Musculoskeletal:  Positive for back pain. Negative for arthralgias.  Skin:  Negative for color change and rash.  Neurological:  Negative for seizures and syncope.  All other systems reviewed and are negative.    Physical Exam Triage Vital Signs ED Triage Vitals  Encounter Vitals Group     BP 12/25/24 1558 133/83     Girls Systolic BP Percentile --      Girls Diastolic BP Percentile --      Boys Systolic BP Percentile --      Boys Diastolic BP Percentile --      Pulse Rate 12/25/24 1558 77     Resp 12/25/24 1558 18     Temp 12/25/24 1558 98.1 F (36.7 C)     Temp Source 12/25/24 1558 Oral     SpO2 12/25/24 1558 97 %     Weight 12/25/24 1557 260 lb 2.3 oz (118 kg)     Height --      Head Circumference --      Peak Flow --      Pain Score 12/25/24 1556 5      Pain Loc --      Pain Education --      Exclude from Growth Chart --    No data found.  Updated Vital Signs BP 133/83 (BP Location: Right Arm)   Pulse 77   Temp 98.1 F (36.7 C) (Oral)   Resp 18  Wt 260 lb 2.3 oz (118 kg)   LMP 02/01/2016 Comment: 2 daya  SpO2 97%   BMI 44.65 kg/m   Visual Acuity Right Eye Distance:   Left Eye Distance:   Bilateral Distance:    Right Eye Near:   Left Eye Near:    Bilateral Near:     Physical Exam Vitals and nursing note reviewed.  Constitutional:      General: She is not in acute distress.    Appearance: She is well-developed.  HENT:     Head: Normocephalic and atraumatic.  Eyes:     Conjunctiva/sclera: Conjunctivae normal.  Cardiovascular:     Rate and Rhythm: Normal rate and regular rhythm.     Heart sounds: No murmur heard. Pulmonary:     Effort: Pulmonary effort is normal. No respiratory distress.     Breath sounds: Normal breath sounds.  Abdominal:     Palpations: Abdomen is soft.     Tenderness: There is no abdominal tenderness.  Musculoskeletal:        General: No swelling.     Cervical back: Neck supple.     Lumbar back: Spasms and tenderness present. Decreased range of motion (Difficulty with lying down and then sitting back up). Positive left straight leg raise test. Negative right straight leg raise test.       Back:  Skin:    General: Skin is warm and dry.     Capillary Refill: Capillary refill takes less than 2 seconds.  Neurological:     Mental Status: She is alert.  Psychiatric:        Mood and Affect: Mood normal.      UC Treatments / Results  Labs (all labs ordered are listed, but only abnormal results are displayed) Labs Reviewed  POCT URINE DIPSTICK - Abnormal; Notable for the following components:      Result Value   Color, UA other (*)    Clarity, UA cloudy (*)    Leukocytes, UA Trace (*)    All other components within normal limits  URINE CULTURE    EKG   Radiology No results  found.  Procedures Procedures (including critical care time)  Medications Ordered in UC Medications  ketorolac  (TORADOL ) 30 MG/ML injection 30 mg (30 mg Intramuscular Given 12/25/24 1622)    Initial Impression / Assessment and Plan / UC Course  I have reviewed the triage vital signs and the nursing notes.  Pertinent labs & imaging results that were available during my care of the patient were reviewed by me and considered in my medical decision making (see chart for details).     Acute bilateral low back pain with left-sided sciatica - Plan: DG Lumbar Spine Complete, DG Lumbar Spine Complete, POCT URINE DIPSTICK, POCT URINE DIPSTICK  Urinary frequency - Plan: Urine Culture, Urine Culture   Urinalysis done today is not consistent with an infectious process however we will send this off for culture to ensure there are no bacteria growing.  If this does show bacterial growth then we will contact you to start antibiotic treatment. X-ray of the back done today.  Final evaluation with the radiologist is still pending but on brief evaluation there does not appear to be any acute findings.  Once the radiologist has finalized the images if there is any changes in your treatment plan we will contact you.  Symptoms and physical exam findings are most consistent with lower back pain with sciatica.  We recommend the following: Toradol  injection given today.  This is a medication to help with pain. This is not a narcotic.   Flexeril  5 mg every 8 hours as needed for muscle spasms.  Use caution as this medication can cause drowsiness.  Prednisone  40 mg (2 tablets) once daily for 3 days. Take this in the morning.  This is a steroid to help with inflammation and pain.  Do not take ibuprofen  while you are taking this medication.  You may take Tylenol .  You may take ibuprofen  after you finish the prednisone .  Please monitor your blood sugars closely as this medication can cause an increase.  If your blood sugar  increases over 300 then discontinue the steroids. Light stretching to improve mobility. May alternate heat and ice to help with symptoms.  Make sure to stay hydrated by drinking plenty of water. Return to urgent care or PCP if symptoms worsen or fail to resolve.      Final Clinical Impressions(s) / UC Diagnoses   Final diagnoses:  Acute bilateral low back pain with left-sided sciatica  Urinary frequency     Discharge Instructions      Urinalysis done today is not consistent with an infectious process however we will send this off for culture to ensure there are no bacteria growing.  If this does show bacterial growth then we will contact you to start antibiotic treatment. X-ray of the back done today.  Final evaluation with the radiologist is still pending but on brief evaluation there does not appear to be any acute findings.  Once the radiologist has finalized the images if there is any changes in your treatment plan we will contact you.  Symptoms and physical exam findings are most consistent with lower back pain with sciatica.  We recommend the following: Toradol  injection given today. This is a medication to help with pain. This is not a narcotic.   Flexeril  5 mg every 8 hours as needed for muscle spasms.  Use caution as this medication can cause drowsiness.  Prednisone  40 mg (2 tablets) once daily for 3 days. Take this in the morning.  This is a steroid to help with inflammation and pain.  Do not take ibuprofen  while you are taking this medication.  You may take Tylenol .  You may take ibuprofen  after you finish the prednisone .  Please monitor your blood sugars closely as this medication can cause an increase.  If your blood sugar increases over 300 then discontinue the steroids. Light stretching to improve mobility. May alternate heat and ice to help with symptoms.  Make sure to stay hydrated by drinking plenty of water. Return to urgent care or PCP if symptoms worsen or fail to  resolve.        ED Prescriptions   None    PDMP not reviewed this encounter.    [1]  Social History Tobacco Use   Smoking status: Every Day    Current packs/day: 1.00    Average packs/day: 1 pack/day for 41.0 years (41.0 ttl pk-yrs)    Types: Cigarettes    Passive exposure: Current   Smokeless tobacco: Never  Vaping Use   Vaping status: Never Used  Substance Use Topics   Alcohol use: Yes   Drug use: Yes    Frequency: 7.0 times per week    Types: Marijuana     Teresa Almarie LABOR, PA-C 12/25/24 1712  "

## 2024-12-25 NOTE — ED Triage Notes (Signed)
 Pt presents c/o mid lower back pain x 7 days. Pt states,  My back has just been bothering me and I don't know from what.  Pt denies injury and/or fall.

## 2024-12-25 NOTE — Discharge Instructions (Addendum)
 Urinalysis done today is not consistent with an infectious process however we will send this off for culture to ensure there are no bacteria growing.  If this does show bacterial growth then we will contact you to start antibiotic treatment. X-ray of the back done today.  Final evaluation with the radiologist is still pending but on brief evaluation there does not appear to be any acute findings.  Once the radiologist has finalized the images if there is any changes in your treatment plan we will contact you.  Symptoms and physical exam findings are most consistent with lower back pain with sciatica.  We recommend the following: Toradol  injection given today. This is a medication to help with pain. This is not a narcotic.   Flexeril  5 mg every 8 hours as needed for muscle spasms.  Use caution as this medication can cause drowsiness.  Start 12/26/24 Prednisone  40 mg (2 tablets) once daily for 3 days. Take this in the morning.  This is a steroid to help with inflammation and pain.  Do not take ibuprofen  while you are taking this medication.  You may take Tylenol .  You may take ibuprofen  after you finish the prednisone .  Please monitor your blood sugars closely as this medication can cause an increase.  If your blood sugar increases over 300 then discontinue the steroids. Light stretching to improve mobility. May alternate heat and ice to help with symptoms.  Make sure to stay hydrated by drinking plenty of water. Return to urgent care or PCP if symptoms worsen or fail to resolve.

## 2024-12-26 ENCOUNTER — Other Ambulatory Visit (HOSPITAL_COMMUNITY): Payer: Self-pay

## 2024-12-26 ENCOUNTER — Ambulatory Visit (HOSPITAL_COMMUNITY): Payer: Self-pay

## 2024-12-26 LAB — URINE CULTURE

## 2025-01-01 ENCOUNTER — Ambulatory Visit: Payer: Self-pay

## 2025-01-01 NOTE — Telephone Encounter (Signed)
 Pt has an appt 1/22 with Dr Charmayne.

## 2025-01-01 NOTE — Telephone Encounter (Signed)
 FYI Only or Action Required?: FYI only for provider: appointment scheduled on 01/08/25.  Patient was last seen in primary care on 06/24/2024 by Nooruddin, Saad, MD.  Called Nurse Triage reporting Back Pain.  Symptoms began several weeks ago.  Interventions attempted: OTC medications: Acetaminophen .  Symptoms are: unchanged.  Triage Disposition: See PCP Within 2 Weeks  Patient/caregiver understands and will follow disposition?: Yes  Reason for Disposition  Back pain present > 2 weeks  Answer Assessment - Initial Assessment Questions Patient states she started to experience this lower back pain about 3 weeks ago. She went to UC on 12/25/24 where they told her it was likely arthritis. She notes that they gave her a shot (Possibly Toradol ) that helped a lot. She also mentions that they prescribed her some medicine to help, but she was unable to afford the medication. She reports the pain as 3/10 currently and reports difficulty getting up. She has been taking tylenol  with little relief. Office visit advised.   1. ONSET: When did the pain begin? (e.g., minutes, hours, days)     About 3 weeks ago  2. LOCATION: Where does it hurt? (upper, mid or lower back)     Left lower back  3. SEVERITY: How bad is the pain?  (e.g., Scale 1-10; mild, moderate, or severe)     3/10 today, increases with movement  4. PATTERN: Is the pain constant? (e.g., yes, no; constant, intermittent)      Constant  5. RADIATION: Does the pain shoot into your legs or somewhere else?     No  6. CAUSE:  What do you think is causing the back pain?      They told me it's arthritis   7. BACK OVERUSE:  Any recent lifting of heavy objects, strenuous work or exercise?     No  8. MEDICINES: What have you taken so far for the pain? (e.g., nothing, acetaminophen , NSAIDS)     Acetaminophen   9. NEUROLOGIC SYMPTOMS: Do you have any weakness, numbness, or problems with bowel/bladder control?     Denies any  neuro symptoms  10. OTHER SYMPTOMS: Do you have any other symptoms? (e.g., fever, abdomen pain, burning with urination, blood in urine)       Denies any other symptoms  11. PREGNANCY: Is there any chance you are pregnant? When was your last menstrual period?       NA  Protocols used: Back Pain-A-AH  Copied from CRM [id number]. Topic: Clinical - Red Word Triage >> Jan 01, 2025 11:41 AM Miquel SAILOR wrote: Red Word that prompted transfer to Nurse Triage: PT has Back pain/for 2-3 weeks and getting worse/Went to urgent care 01/08/Hard to sit/Hard to do her daily activity's.

## 2025-01-06 ENCOUNTER — Other Ambulatory Visit (HOSPITAL_COMMUNITY): Payer: Self-pay

## 2025-01-08 ENCOUNTER — Other Ambulatory Visit: Payer: Self-pay

## 2025-01-08 ENCOUNTER — Ambulatory Visit: Payer: Self-pay

## 2025-01-08 VITALS — BP 138/71 | HR 89 | Temp 98.2°F | Ht 64.0 in | Wt 247.8 lb

## 2025-01-08 DIAGNOSIS — M545 Low back pain, unspecified: Secondary | ICD-10-CM | POA: Diagnosis present

## 2025-01-08 DIAGNOSIS — Z7985 Long-term (current) use of injectable non-insulin antidiabetic drugs: Secondary | ICD-10-CM | POA: Diagnosis not present

## 2025-01-08 DIAGNOSIS — Z7984 Long term (current) use of oral hypoglycemic drugs: Secondary | ICD-10-CM

## 2025-01-08 DIAGNOSIS — E119 Type 2 diabetes mellitus without complications: Secondary | ICD-10-CM | POA: Diagnosis not present

## 2025-01-08 LAB — POCT GLYCOSYLATED HEMOGLOBIN (HGB A1C): HbA1c, POC (controlled diabetic range): 5.9 % (ref 0.0–7.0)

## 2025-01-08 LAB — GLUCOSE, CAPILLARY: Glucose-Capillary: 122 mg/dL — ABNORMAL HIGH (ref 70–99)

## 2025-01-08 MED ORDER — LIDOCAINE 5 % EX PTCH
1.0000 | MEDICATED_PATCH | CUTANEOUS | 0 refills | Status: AC
  Filled 2025-01-08: qty 10, 10d supply, fill #0

## 2025-01-08 MED ORDER — DICLOFENAC SODIUM 1 % EX GEL
4.0000 g | Freq: Four times a day (QID) | CUTANEOUS | 1 refills | Status: AC
  Filled 2025-01-08: qty 100, 6d supply, fill #0

## 2025-01-08 NOTE — Progress Notes (Signed)
 "  Established Patient Office Visit  Subjective   Patient ID: Erika Wong, female    DOB: 1966-07-27  Age: 59 y.o. MRN: [id number]  HPI Accompanied by husband.  Patient was seen in urgent care on 1/8 for acute low back pain.  X-rays at that time showed no acute abnormality but consistent with mild multilevel degenerative changes. Today her pain has much improved.  She says that it was about a 9 out of 10 when she went to urgent care and she thinks that the shot helped her a lot.  They also prescribed her Flexeril  and prednisone  although she never picked up these medicines.  She is not sure why it got better, she has not taken anything for the pain. She denies any traumatic event that coincided with the onset of the pain.  She says that she really has not had low back pain before but that early in July this low back pain started all of a sudden which prompted her to go to urgent care.  She denies numbness or tingling in her feet or legs as well as denies numbness or tingling in her genital area.  She denies fevers or chills. She says at urgent care she was very tender to touch on her low back and when the provider raised her left leg she had significant pain only raising her leg a couple inches off the table.  At this point in time her husband was having to help her go from any laying or sitting position to standing. Today her pain is much improved, she is back to doing her normal activities without pain.  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   Stroke (HCC)    Tachypnea    Tobacco abuse    Transient cerebral ischemia    UTI (lower urinary tract infection) 03/09/2016        Objective:      BP 138/71 (BP Location: Right Arm, Patient Position: Sitting, Cuff Size: Large)   Pulse 89   Temp 98.2 F (36.8 C) (Oral)   Ht 5' 4 (1.626 m)   Wt 247 lb 12.8 oz (112.4 kg)   LMP 02/01/2016 Comment: 2 daya  SpO2 98%   BMI 42.53 kg/m  BP Readings from Last 3 Encounters:  01/08/25 138/71  12/25/24 133/83  06/24/24 126/63   Wt Readings from Last 3 Encounters:  01/08/25 247 lb 12.8 oz (112.4 kg)  12/25/24 260 lb 2.3 oz (118 kg)  06/24/24 260 lb 3.2 oz (118 kg)      Physical Exam Constitutional:      Appearance: She is obese.  Musculoskeletal:     Comments: No tenderness to palpation over the bilateral lumbosacral area Negative straight leg raise on both right and left leg Negative Trendelenburg exam bilaterally, although right leg has chronic weakness from previous CVA She is able to perform the FABER test bilaterally without pain, right leg needs more assistance although this is chronic due to stroke  Skin:    General: Skin is warm.     Findings: No bruising, ecchymosis, signs of injury, lesion or rash.  Neurological:     General: No focal deficit present.  Mental Status: She is alert and oriented to person, place, and time.  Psychiatric:        Mood and Affect: Mood normal.        Behavior: Behavior normal.      Results for orders placed or performed in visit on 01/08/25  Glucose, capillary  Result Value Ref Range   Glucose-Capillary 122 (H) 70 - 99 mg/dL  POC Hbg J8R  Result Value Ref Range   Hemoglobin A1C     HbA1c POC (<> result, manual entry)     HbA1c, POC (prediabetic range)     HbA1c, POC (controlled diabetic range) 5.9 0.0 - 7.0 %    Last hemoglobin A1c Lab Results  Component Value Date   HGBA1C 5.9 01/08/2025      The ASCVD Risk score (Arnett DK, et al., 2019) failed to calculate for the following reasons:   Risk score cannot be calculated because patient has a medical history suggesting prior/existing ASCVD   * - Cholesterol units were  assumed    Assessment & Plan:   Assessment & Plan Acute bilateral low back pain without sciatica Given the acute onset of low back pain that has improved since being seen at the urgent care, I suspect she had an acute muscle strain and has had resolution of her symptoms.  She does have degenerative changes indicative of osteoarthritis on lumbar x-ray.  Discussed the importance of continuing to lose weight and increase her physical activity which are the best therapies for osteoarthritis and OA-induced pain.  She has lost 13 pounds since her last office visit in July and A1c is down from 6.9->5.9 today.  Encouraged her to continue doing so well managing her diabetes and that that will help prevent osteoarthritis induced pain in the future.  I also encouraged her to increase her physical activity to support her bones.  Discussed using a heating pad to her lower back every day followed by low back exercises to increase her mobility and prevent future muscle strains.  Discussed that if she has recurrence of the pain she can alternate between acetaminophen  and naproxen .  I recommended that she not take naproxen  for more than 7 days at a time.  I also sent her home with Voltaren  gel and lidocaine  patches that she may use if she has recurrence of the pain.  We discussed referral to physical therapy if the pain comes back and becomes persistent. Plan Heating pad daily followed by low back exercises daily For acute exacerbation, alternate acetaminophen  and naproxen , Voltaren  gel and lidocaine  patches as needed Consider PT if becomes persistent Orders:   diclofenac  Sodium (VOLTAREN ) 1 % GEL; Apply 4 grams topically 4 (four) times daily.   lidocaine  (LIDODERM ) 5 %; Place 1 patch onto the skin daily for 10 days. Remove & Discard patch within 12 hours or as directed by MD  Type 2 diabetes mellitus without complication, without long-term current use of insulin (HCC) A1c down from 6.9-5.9 today.  Current regimen  includes metformin  1000 mg daily, Ozempic  0.25 mg weekly and Jardiance  10 mg daily.  We did not discuss her diabetes at length today but I did congratulate her on her improvement.  Will have her back in clinic in 3 months for next A1c and diabetes follow-up. Plan Continue current regimen Continued weight loss will help with low back pain Orders:   POC Hbg A1C   Glucose, capillary   Return in about 3 months (around 04/08/2025) for DM fu.  Viktoria King, DO "

## 2025-01-08 NOTE — Assessment & Plan Note (Signed)
 A1c down from 6.9-5.9 today.  Current regimen includes metformin  1000 mg daily, Ozempic  0.25 mg weekly and Jardiance  10 mg daily.  We did not discuss her diabetes at length today but I did congratulate her on her improvement.  Will have her back in clinic in 3 months for next A1c and diabetes follow-up. Plan Continue current regimen Continued weight loss will help with low back pain Orders:   POC Hbg A1C   Glucose, capillary

## 2025-01-08 NOTE — Patient Instructions (Addendum)
 It was wonderful seeing you today!   1) Keep up the great work on your diabetes, eating healthy and staying active  2) Try using your heating pad everyday for about 15 minutes and then do your back stretches to see if we can prevent this pain from happening again  If you have the pain again you can alternate between acetaminophen  and naproxen  (Aleve ). Do not take naproxen  for more than a week at a time.   You may use the voltaren  gel everyday if you have pain. I also sent in some lidocaine  patches that you can try if you have the pain again.   See you in three months!   If you have any questions please feel free to the call the clinic at anytime at [phone].  Have a blessed day,  Dr. Charmayne

## 2025-01-17 NOTE — Progress Notes (Signed)
 Internal Medicine Clinic Attending  Case discussed with the resident at the time of the visit.  We reviewed the resident's history and exam and pertinent patient test results.  I agree with the assessment, diagnosis, and plan of care documented in the resident's note.

## 2025-04-09 ENCOUNTER — Ambulatory Visit: Payer: Self-pay | Admitting: Student
